# Patient Record
Sex: Male | Born: 1935 | Race: White | Hispanic: No | Marital: Married | Smoking: Former smoker
Health system: Southern US, Community
[De-identification: ages and names within clinical notes are randomized; demographics above are authoritative.]

## PROBLEM LIST (undated history)

## (undated) DIAGNOSIS — M419 Scoliosis, unspecified: Secondary | ICD-10-CM

## (undated) DIAGNOSIS — Z950 Presence of cardiac pacemaker: Secondary | ICD-10-CM

## (undated) DIAGNOSIS — M169 Osteoarthritis of hip, unspecified: Secondary | ICD-10-CM

## (undated) DIAGNOSIS — E7800 Pure hypercholesterolemia, unspecified: Secondary | ICD-10-CM

## (undated) DIAGNOSIS — E559 Vitamin D deficiency, unspecified: Secondary | ICD-10-CM

## (undated) DIAGNOSIS — K5900 Constipation, unspecified: Secondary | ICD-10-CM

## (undated) DIAGNOSIS — R350 Frequency of micturition: Secondary | ICD-10-CM

## (undated) DIAGNOSIS — R4189 Other symptoms and signs involving cognitive functions and awareness: Secondary | ICD-10-CM

## (undated) DIAGNOSIS — M479 Spondylosis, unspecified: Secondary | ICD-10-CM

## (undated) DIAGNOSIS — I499 Cardiac arrhythmia, unspecified: Secondary | ICD-10-CM

## (undated) DIAGNOSIS — N138 Other obstructive and reflux uropathy: Secondary | ICD-10-CM

## (undated) DIAGNOSIS — Z9889 Other specified postprocedural states: Secondary | ICD-10-CM

## (undated) DIAGNOSIS — Z8679 Personal history of other diseases of the circulatory system: Secondary | ICD-10-CM

## (undated) DIAGNOSIS — F419 Anxiety disorder, unspecified: Secondary | ICD-10-CM

## (undated) DIAGNOSIS — N183 Chronic kidney disease, stage 3 (moderate): Secondary | ICD-10-CM

## (undated) DIAGNOSIS — I10 Essential (primary) hypertension: Secondary | ICD-10-CM

## (undated) DIAGNOSIS — H3530 Unspecified macular degeneration: Secondary | ICD-10-CM

## (undated) DIAGNOSIS — N401 Enlarged prostate with lower urinary tract symptoms: Secondary | ICD-10-CM

## (undated) DIAGNOSIS — K219 Gastro-esophageal reflux disease without esophagitis: Secondary | ICD-10-CM

## (undated) DIAGNOSIS — M6289 Other specified disorders of muscle: Secondary | ICD-10-CM

## (undated) DIAGNOSIS — I491 Atrial premature depolarization: Secondary | ICD-10-CM

## (undated) DIAGNOSIS — R251 Tremor, unspecified: Secondary | ICD-10-CM

## (undated) DIAGNOSIS — M48061 Spinal stenosis, lumbar region without neurogenic claudication: Secondary | ICD-10-CM

## (undated) DIAGNOSIS — R2689 Other abnormalities of gait and mobility: Secondary | ICD-10-CM

## (undated) HISTORY — DX: Unspecified macular degeneration: H35.30

## (undated) HISTORY — DX: Personal history of other diseases of the circulatory system: Z86.79

## (undated) HISTORY — DX: Tremor, unspecified: R25.1

## (undated) HISTORY — DX: Osteoarthritis of hip, unspecified: M16.9

## (undated) HISTORY — DX: Frequency of micturition: R35.0

## (undated) HISTORY — DX: Other specified disorders of muscle: M62.89

## (undated) HISTORY — PX: COLONOSCOPY: SHX174

## (undated) HISTORY — DX: Pure hypercholesterolemia, unspecified: E78.00

## (undated) HISTORY — DX: Other symptoms and signs involving cognitive functions and awareness: R41.89

## (undated) HISTORY — DX: Benign prostatic hyperplasia with lower urinary tract symptoms: N40.1

## (undated) HISTORY — DX: Anxiety disorder, unspecified: F41.9

## (undated) HISTORY — PX: CHOLECYSTECTOMY: SHX55

## (undated) HISTORY — DX: Other specified postprocedural states: Z98.890

## (undated) HISTORY — DX: Essential (primary) hypertension: I10

## (undated) HISTORY — DX: Spondylosis, unspecified: M47.9

## (undated) HISTORY — DX: Vitamin D deficiency, unspecified: E55.9

## (undated) HISTORY — DX: Presence of cardiac pacemaker: Z95.0

## (undated) HISTORY — DX: Constipation, unspecified: K59.00

## (undated) HISTORY — DX: Scoliosis, unspecified: M41.9

## (undated) HISTORY — DX: Chronic kidney disease, stage 3 (moderate): N18.3

## (undated) HISTORY — DX: Other obstructive and reflux uropathy: N13.8

## (undated) HISTORY — DX: Spinal stenosis, lumbar region without neurogenic claudication: M48.061

## (undated) HISTORY — DX: Gastro-esophageal reflux disease without esophagitis: K21.9

## (undated) HISTORY — DX: Other abnormalities of gait and mobility: R26.89

## (undated) HISTORY — DX: Atrial premature depolarization: I49.1

---
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
## 1999-02-26 DIAGNOSIS — I499 Cardiac arrhythmia, unspecified: Secondary | ICD-10-CM

## 1999-02-26 HISTORY — DX: Cardiac arrhythmia, unspecified: I49.9

## 1999-02-26 HISTORY — PX: ATRIAL FLUTTER ABLATION: SHX5733

## 2006-10-08 DIAGNOSIS — E7800 Pure hypercholesterolemia, unspecified: Secondary | ICD-10-CM | POA: Insufficient documentation

## 2008-12-15 DIAGNOSIS — I491 Atrial premature depolarization: Secondary | ICD-10-CM

## 2008-12-15 HISTORY — DX: Atrial premature depolarization: I49.1

## 2009-02-09 DIAGNOSIS — I10 Essential (primary) hypertension: Secondary | ICD-10-CM

## 2009-02-09 HISTORY — DX: Essential (primary) hypertension: I10

## 2011-02-26 DIAGNOSIS — Z9889 Other specified postprocedural states: Secondary | ICD-10-CM

## 2011-02-26 HISTORY — DX: Other specified postprocedural states: Z98.890

## 2011-11-04 DIAGNOSIS — N183 Chronic kidney disease, stage 3 unspecified: Secondary | ICD-10-CM | POA: Insufficient documentation

## 2011-11-04 HISTORY — DX: Chronic kidney disease, stage 3 unspecified: N18.30

## 2012-05-18 DIAGNOSIS — E559 Vitamin D deficiency, unspecified: Secondary | ICD-10-CM

## 2012-05-18 HISTORY — DX: Vitamin D deficiency, unspecified: E55.9

## 2013-02-25 HISTORY — PX: CATARACT EXTRACTION: SUR2

## 2013-05-14 DIAGNOSIS — M419 Scoliosis, unspecified: Secondary | ICD-10-CM

## 2013-05-14 DIAGNOSIS — M169 Osteoarthritis of hip, unspecified: Secondary | ICD-10-CM

## 2013-05-14 DIAGNOSIS — M479 Spondylosis, unspecified: Secondary | ICD-10-CM

## 2013-05-14 HISTORY — DX: Scoliosis, unspecified: M41.9

## 2013-05-14 HISTORY — DX: Osteoarthritis of hip, unspecified: M16.9

## 2013-05-14 HISTORY — DX: Spondylosis, unspecified: M47.9

## 2013-07-07 DIAGNOSIS — M48062 Spinal stenosis, lumbar region with neurogenic claudication: Secondary | ICD-10-CM | POA: Insufficient documentation

## 2013-07-07 DIAGNOSIS — M48061 Spinal stenosis, lumbar region without neurogenic claudication: Secondary | ICD-10-CM

## 2013-07-07 HISTORY — DX: Spinal stenosis, lumbar region without neurogenic claudication: M48.061

## 2013-11-18 DIAGNOSIS — I495 Sick sinus syndrome: Secondary | ICD-10-CM | POA: Insufficient documentation

## 2013-12-07 DIAGNOSIS — N401 Enlarged prostate with lower urinary tract symptoms: Secondary | ICD-10-CM

## 2013-12-07 DIAGNOSIS — N138 Other obstructive and reflux uropathy: Secondary | ICD-10-CM

## 2013-12-07 DIAGNOSIS — R350 Frequency of micturition: Secondary | ICD-10-CM

## 2013-12-07 HISTORY — DX: Other obstructive and reflux uropathy: N40.1

## 2013-12-07 HISTORY — DX: Other obstructive and reflux uropathy: N13.8

## 2014-02-25 DIAGNOSIS — Z950 Presence of cardiac pacemaker: Secondary | ICD-10-CM

## 2014-02-25 HISTORY — PX: PACEMAKER PLACEMENT: SHX43

## 2014-02-25 HISTORY — PX: SPINE SURGERY: SHX786

## 2014-02-25 HISTORY — DX: Presence of cardiac pacemaker: Z95.0

## 2014-04-05 DIAGNOSIS — M4806 Spinal stenosis, lumbar region: Secondary | ICD-10-CM | POA: Diagnosis not present

## 2014-04-05 DIAGNOSIS — Z950 Presence of cardiac pacemaker: Secondary | ICD-10-CM | POA: Diagnosis not present

## 2014-04-05 DIAGNOSIS — M545 Low back pain: Secondary | ICD-10-CM | POA: Diagnosis not present

## 2014-04-05 DIAGNOSIS — I495 Sick sinus syndrome: Secondary | ICD-10-CM | POA: Diagnosis not present

## 2014-04-05 DIAGNOSIS — M961 Postlaminectomy syndrome, not elsewhere classified: Secondary | ICD-10-CM | POA: Diagnosis not present

## 2014-04-20 DIAGNOSIS — H3531 Nonexudative age-related macular degeneration: Secondary | ICD-10-CM | POA: Diagnosis not present

## 2014-05-30 DIAGNOSIS — H43813 Vitreous degeneration, bilateral: Secondary | ICD-10-CM | POA: Diagnosis not present

## 2014-05-30 DIAGNOSIS — H3531 Nonexudative age-related macular degeneration: Secondary | ICD-10-CM | POA: Diagnosis not present

## 2014-05-30 DIAGNOSIS — H35371 Puckering of macula, right eye: Secondary | ICD-10-CM | POA: Diagnosis not present

## 2014-06-07 DIAGNOSIS — M545 Low back pain: Secondary | ICD-10-CM | POA: Diagnosis not present

## 2014-06-07 DIAGNOSIS — M961 Postlaminectomy syndrome, not elsewhere classified: Secondary | ICD-10-CM | POA: Diagnosis not present

## 2014-06-07 DIAGNOSIS — M419 Scoliosis, unspecified: Secondary | ICD-10-CM | POA: Diagnosis not present

## 2014-06-15 DIAGNOSIS — Z862 Personal history of diseases of the blood and blood-forming organs and certain disorders involving the immune mechanism: Secondary | ICD-10-CM | POA: Diagnosis not present

## 2014-06-15 DIAGNOSIS — I10 Essential (primary) hypertension: Secondary | ICD-10-CM | POA: Diagnosis not present

## 2014-06-15 DIAGNOSIS — M4806 Spinal stenosis, lumbar region: Secondary | ICD-10-CM | POA: Diagnosis not present

## 2014-06-15 DIAGNOSIS — N183 Chronic kidney disease, stage 3 (moderate): Secondary | ICD-10-CM | POA: Diagnosis not present

## 2014-06-15 DIAGNOSIS — K219 Gastro-esophageal reflux disease without esophagitis: Secondary | ICD-10-CM | POA: Diagnosis not present

## 2014-06-15 DIAGNOSIS — E780 Pure hypercholesterolemia: Secondary | ICD-10-CM | POA: Diagnosis not present

## 2014-06-15 DIAGNOSIS — Z0000 Encounter for general adult medical examination without abnormal findings: Secondary | ICD-10-CM | POA: Diagnosis not present

## 2014-06-15 DIAGNOSIS — Z79899 Other long term (current) drug therapy: Secondary | ICD-10-CM | POA: Diagnosis not present

## 2014-06-15 DIAGNOSIS — F419 Anxiety disorder, unspecified: Secondary | ICD-10-CM | POA: Diagnosis not present

## 2014-06-15 DIAGNOSIS — I495 Sick sinus syndrome: Secondary | ICD-10-CM | POA: Diagnosis not present

## 2014-07-11 DIAGNOSIS — Z950 Presence of cardiac pacemaker: Secondary | ICD-10-CM | POA: Diagnosis not present

## 2014-07-11 DIAGNOSIS — I495 Sick sinus syndrome: Secondary | ICD-10-CM | POA: Diagnosis not present

## 2014-08-22 DIAGNOSIS — I491 Atrial premature depolarization: Secondary | ICD-10-CM | POA: Diagnosis not present

## 2014-08-22 DIAGNOSIS — I10 Essential (primary) hypertension: Secondary | ICD-10-CM | POA: Diagnosis not present

## 2014-08-22 DIAGNOSIS — I495 Sick sinus syndrome: Secondary | ICD-10-CM | POA: Diagnosis not present

## 2014-10-13 DIAGNOSIS — Z950 Presence of cardiac pacemaker: Secondary | ICD-10-CM | POA: Diagnosis not present

## 2014-10-13 DIAGNOSIS — I495 Sick sinus syndrome: Secondary | ICD-10-CM | POA: Diagnosis not present

## 2014-11-02 DIAGNOSIS — H3531 Nonexudative age-related macular degeneration: Secondary | ICD-10-CM | POA: Diagnosis not present

## 2014-11-28 DIAGNOSIS — H35371 Puckering of macula, right eye: Secondary | ICD-10-CM | POA: Diagnosis not present

## 2014-11-28 DIAGNOSIS — H353132 Nonexudative age-related macular degeneration, bilateral, intermediate dry stage: Secondary | ICD-10-CM | POA: Diagnosis not present

## 2014-11-28 DIAGNOSIS — H43813 Vitreous degeneration, bilateral: Secondary | ICD-10-CM | POA: Diagnosis not present

## 2014-11-29 DIAGNOSIS — H26493 Other secondary cataract, bilateral: Secondary | ICD-10-CM | POA: Diagnosis not present

## 2014-11-29 DIAGNOSIS — H353133 Nonexudative age-related macular degeneration, bilateral, advanced atrophic without subfoveal involvement: Secondary | ICD-10-CM | POA: Diagnosis not present

## 2014-11-29 DIAGNOSIS — Z961 Presence of intraocular lens: Secondary | ICD-10-CM | POA: Diagnosis not present

## 2014-11-29 DIAGNOSIS — H26492 Other secondary cataract, left eye: Secondary | ICD-10-CM | POA: Diagnosis not present

## 2014-11-29 DIAGNOSIS — H26491 Other secondary cataract, right eye: Secondary | ICD-10-CM | POA: Diagnosis not present

## 2014-12-01 DIAGNOSIS — N3000 Acute cystitis without hematuria: Secondary | ICD-10-CM | POA: Diagnosis not present

## 2014-12-01 DIAGNOSIS — R350 Frequency of micturition: Secondary | ICD-10-CM | POA: Diagnosis not present

## 2014-12-15 DIAGNOSIS — E7800 Pure hypercholesterolemia, unspecified: Secondary | ICD-10-CM | POA: Diagnosis not present

## 2014-12-15 DIAGNOSIS — I10 Essential (primary) hypertension: Secondary | ICD-10-CM | POA: Diagnosis not present

## 2014-12-15 DIAGNOSIS — Z79899 Other long term (current) drug therapy: Secondary | ICD-10-CM | POA: Diagnosis not present

## 2014-12-15 DIAGNOSIS — Z87448 Personal history of other diseases of urinary system: Secondary | ICD-10-CM | POA: Diagnosis not present

## 2014-12-15 DIAGNOSIS — N183 Chronic kidney disease, stage 3 (moderate): Secondary | ICD-10-CM | POA: Diagnosis not present

## 2014-12-15 DIAGNOSIS — M19041 Primary osteoarthritis, right hand: Secondary | ICD-10-CM | POA: Diagnosis not present

## 2015-01-16 DIAGNOSIS — J309 Allergic rhinitis, unspecified: Secondary | ICD-10-CM | POA: Diagnosis not present

## 2015-01-16 DIAGNOSIS — J019 Acute sinusitis, unspecified: Secondary | ICD-10-CM | POA: Diagnosis not present

## 2015-01-16 DIAGNOSIS — M65321 Trigger finger, right index finger: Secondary | ICD-10-CM | POA: Diagnosis not present

## 2015-02-08 DIAGNOSIS — Z950 Presence of cardiac pacemaker: Secondary | ICD-10-CM | POA: Diagnosis not present

## 2015-02-08 DIAGNOSIS — I491 Atrial premature depolarization: Secondary | ICD-10-CM | POA: Diagnosis not present

## 2015-02-08 DIAGNOSIS — I495 Sick sinus syndrome: Secondary | ICD-10-CM | POA: Diagnosis not present

## 2015-02-08 DIAGNOSIS — I10 Essential (primary) hypertension: Secondary | ICD-10-CM | POA: Diagnosis not present

## 2015-02-08 DIAGNOSIS — Z45018 Encounter for adjustment and management of other part of cardiac pacemaker: Secondary | ICD-10-CM | POA: Diagnosis not present

## 2015-02-15 DIAGNOSIS — M65341 Trigger finger, right ring finger: Secondary | ICD-10-CM | POA: Diagnosis not present

## 2015-03-30 DIAGNOSIS — M65341 Trigger finger, right ring finger: Secondary | ICD-10-CM | POA: Diagnosis not present

## 2015-05-08 DIAGNOSIS — H353132 Nonexudative age-related macular degeneration, bilateral, intermediate dry stage: Secondary | ICD-10-CM | POA: Diagnosis not present

## 2015-05-08 DIAGNOSIS — H43313 Vitreous membranes and strands, bilateral: Secondary | ICD-10-CM | POA: Diagnosis not present

## 2015-05-08 DIAGNOSIS — H04123 Dry eye syndrome of bilateral lacrimal glands: Secondary | ICD-10-CM | POA: Diagnosis not present

## 2015-06-01 DIAGNOSIS — H35371 Puckering of macula, right eye: Secondary | ICD-10-CM | POA: Diagnosis not present

## 2015-06-01 DIAGNOSIS — H353132 Nonexudative age-related macular degeneration, bilateral, intermediate dry stage: Secondary | ICD-10-CM | POA: Diagnosis not present

## 2015-06-21 DIAGNOSIS — Z950 Presence of cardiac pacemaker: Secondary | ICD-10-CM | POA: Diagnosis not present

## 2015-06-21 DIAGNOSIS — I495 Sick sinus syndrome: Secondary | ICD-10-CM | POA: Diagnosis not present

## 2015-06-28 DIAGNOSIS — Z79899 Other long term (current) drug therapy: Secondary | ICD-10-CM | POA: Diagnosis not present

## 2015-06-28 DIAGNOSIS — K219 Gastro-esophageal reflux disease without esophagitis: Secondary | ICD-10-CM | POA: Diagnosis not present

## 2015-06-28 DIAGNOSIS — Z862 Personal history of diseases of the blood and blood-forming organs and certain disorders involving the immune mechanism: Secondary | ICD-10-CM | POA: Diagnosis not present

## 2015-06-28 DIAGNOSIS — E7800 Pure hypercholesterolemia, unspecified: Secondary | ICD-10-CM | POA: Diagnosis not present

## 2015-06-28 DIAGNOSIS — M65342 Trigger finger, left ring finger: Secondary | ICD-10-CM | POA: Diagnosis not present

## 2015-06-28 DIAGNOSIS — J309 Allergic rhinitis, unspecified: Secondary | ICD-10-CM | POA: Diagnosis not present

## 2015-06-28 DIAGNOSIS — Z0000 Encounter for general adult medical examination without abnormal findings: Secondary | ICD-10-CM | POA: Diagnosis not present

## 2015-06-28 DIAGNOSIS — I4892 Unspecified atrial flutter: Secondary | ICD-10-CM | POA: Diagnosis not present

## 2015-07-12 DIAGNOSIS — M79676 Pain in unspecified toe(s): Secondary | ICD-10-CM | POA: Diagnosis not present

## 2015-07-12 DIAGNOSIS — L03031 Cellulitis of right toe: Secondary | ICD-10-CM | POA: Diagnosis not present

## 2015-07-18 DIAGNOSIS — L03031 Cellulitis of right toe: Secondary | ICD-10-CM | POA: Diagnosis not present

## 2015-07-18 DIAGNOSIS — R29898 Other symptoms and signs involving the musculoskeletal system: Secondary | ICD-10-CM | POA: Diagnosis not present

## 2015-08-03 ENCOUNTER — Encounter: Payer: Self-pay | Admitting: Internal Medicine

## 2015-08-03 ENCOUNTER — Non-Acute Institutional Stay: Payer: Medicare Other | Admitting: Internal Medicine

## 2015-08-03 VITALS — BP 132/62 | HR 66 | Temp 97.7°F | Ht 69.0 in | Wt 166.0 lb

## 2015-08-03 DIAGNOSIS — Z950 Presence of cardiac pacemaker: Secondary | ICD-10-CM

## 2015-08-03 DIAGNOSIS — Z8679 Personal history of other diseases of the circulatory system: Secondary | ICD-10-CM

## 2015-08-03 DIAGNOSIS — M48061 Spinal stenosis, lumbar region without neurogenic claudication: Secondary | ICD-10-CM

## 2015-08-03 DIAGNOSIS — M160 Bilateral primary osteoarthritis of hip: Secondary | ICD-10-CM | POA: Diagnosis not present

## 2015-08-03 DIAGNOSIS — R350 Frequency of micturition: Secondary | ICD-10-CM | POA: Diagnosis not present

## 2015-08-03 DIAGNOSIS — R4189 Other symptoms and signs involving cognitive functions and awareness: Secondary | ICD-10-CM

## 2015-08-03 DIAGNOSIS — N183 Chronic kidney disease, stage 3 unspecified: Secondary | ICD-10-CM

## 2015-08-03 DIAGNOSIS — H3530 Unspecified macular degeneration: Secondary | ICD-10-CM | POA: Diagnosis not present

## 2015-08-03 DIAGNOSIS — I10 Essential (primary) hypertension: Secondary | ICD-10-CM | POA: Diagnosis not present

## 2015-08-03 DIAGNOSIS — M791 Myalgia, unspecified site: Secondary | ICD-10-CM | POA: Insufficient documentation

## 2015-08-03 DIAGNOSIS — M6289 Other specified disorders of muscle: Secondary | ICD-10-CM

## 2015-08-03 DIAGNOSIS — N138 Other obstructive and reflux uropathy: Secondary | ICD-10-CM

## 2015-08-03 DIAGNOSIS — N401 Enlarged prostate with lower urinary tract symptoms: Secondary | ICD-10-CM | POA: Diagnosis not present

## 2015-08-03 DIAGNOSIS — K5900 Constipation, unspecified: Secondary | ICD-10-CM | POA: Insufficient documentation

## 2015-08-03 DIAGNOSIS — K219 Gastro-esophageal reflux disease without esophagitis: Secondary | ICD-10-CM

## 2015-08-03 DIAGNOSIS — F419 Anxiety disorder, unspecified: Secondary | ICD-10-CM

## 2015-08-03 DIAGNOSIS — R251 Tremor, unspecified: Secondary | ICD-10-CM | POA: Diagnosis not present

## 2015-08-03 DIAGNOSIS — M4806 Spinal stenosis, lumbar region: Secondary | ICD-10-CM

## 2015-08-03 DIAGNOSIS — R2689 Other abnormalities of gait and mobility: Secondary | ICD-10-CM

## 2015-08-03 DIAGNOSIS — R29818 Other symptoms and signs involving the nervous system: Secondary | ICD-10-CM | POA: Diagnosis not present

## 2015-08-03 HISTORY — DX: Anxiety disorder, unspecified: F41.9

## 2015-08-03 HISTORY — DX: Constipation, unspecified: K59.00

## 2015-08-03 HISTORY — DX: Other specified disorders of muscle: M62.89

## 2015-08-03 HISTORY — DX: Personal history of other diseases of the circulatory system: Z86.79

## 2015-08-03 HISTORY — DX: Gastro-esophageal reflux disease without esophagitis: K21.9

## 2015-08-03 NOTE — Progress Notes (Signed)
Patient ID: Stanley Hunter, male   DOB: [birth year], 82 y.o.   MRN: [id number]    HISTORY AND PHYSICAL  Location:  [place] of Service: Clinic (12)   Extended Emergency Contact Information Primary Emergency Contact: Pereida,[name] Address: [address]          [place], [place] [address] [place] Phone: [phone] Relation: Spouse  Advanced Directive information Does patient have an advance directive?: Yes, Type of Advance Directive: Healthcare Power of [place];Living will  Chief Complaint  Patient presents with  . Medical Management of Chronic Issues    New Patient -switching to [facility]. Previous doctor Dr. Burnett Sheng. Here with wife.    HPI:  Patient is changing physicians from his primary care in [facility] to [facility] for convenience because of the office at [facility]. Patient has become a resident at [facility] about one month ago.  History of pacemaker - placed in 2016 for sick sinus syndrome. Currently functioning very well .  Tremor - mild tremor of the extremities. Etiology has not been determined   Loss of balance - this has become a chronic issue. He uses a walker with 4 wheels. He denies any recent falls.   Macular degeneration - visual losses do not interfere with driving yet   Urinary frequency - possibly related to BPH   Cognitive changes - patient states his memory is not what it used to be, but doesn't seem to be creating any problems.   Myalgia - chronic issue with diffuse muscular aches. Much of this seems secondary to arthritic problems and related muscular spasm and overuse.   Lumbar canal stenosis - associated with chronic back discomfort   Chronic kidney disease (CKD), stage III (moderate) - patient will need an updated lab   Benign prostatic hyperplasia with urinary obstruction - likely the root cause of urinary frequency   Benign essential HTN -controlled    Primary  osteoarthritis of both hips    Past Medical History  Diagnosis Date  . High blood pressure   . High cholesterol   . History of pacemaker 2016  . Hx of cardiac cath 2013    Past Surgical History  Procedure Laterality Date  . Tonsillectomy  1934  . Cataract extraction Bilateral 2015  . Spine surgery  2016  . Mastoidectomy  1934    lower back   . Pacemaker placement  2016    Patient Care Team: Estill Dooms, MD as PCP - General (Internal Medicine)  Social History   Social History  . Marital Status: Married    Spouse Name: N/A  . Number of Children: N/A  . Years of Education: N/A   Occupational History  . Not on file.   Social History Main Topics  . Smoking status: Former Smoker -- 50 years    Types: Cigarettes    Quit date: 08/03/1975  . Smokeless tobacco: Never Used     Comment: smoked 6 cig dialy   . Alcohol Use: 1.8 oz/week    3 Standard drinks or equivalent per week     Comment: 3 times a week  . Drug Use: No  . Sexual Activity: Not on file   Other Topics Concern  . Not on file   Social History Narrative   Lives at [facility]  Since 4 /16/2016 with wife [name]   Diet: n/a   Caffeine: Yes   Married: yes, 1957   House:  Yes, 2 persons   Pets: no pets    Current/Past profession: Accountant    Exercise: No   Living Will: Yes   DNR: n/a   POA/HPOA: n/a       reports that he quit smoking about 40 years ago. His smoking use included Cigarettes. He quit after 50 years of use. He has never used smokeless tobacco. He reports that he drinks about 1.8 oz of alcohol per week. He reports that he does not use illicit drugs.  History reviewed. No pertinent family history. Family Status  Relation Status Death Age  . Father Deceased 57  . Mother Deceased 8  . Brother Deceased 54  . Son Alive   . Daughter Alive   . Daughter Alive     Immunization History  Administered Date(s) Administered  . Td 02/26/1996    Allergies  Allergen Reactions  .  Cortisone Other (See Comments)  . Neomycin Rash  . Sulfamethoxazole-Trimethoprim Other (See Comments)    Leg weakness    Medications: Patient's Medications  New Prescriptions   No medications on file  Previous Medications   ALPRAZOLAM (XANAX) 0.5 MG TABLET    Take 0.5 mg by mouth as needed for anxiety.   AMLODIPINE (NORVASC) 10 MG TABLET    Take 10 mg by mouth daily.   ASPIRIN EC 81 MG TABLET    Take 81 mg by mouth daily.   ATORVASTATIN (LIPITOR) 40 MG TABLET    Take 40 mg by mouth daily.   CETIRIZINE (ZYRTEC) 10 MG TABLET    Take 10 mg by mouth daily.   MAGNESIUM HYDROXIDE (MILK OF MAGNESIA) 800 MG/5ML SUSPENSION    Take 30 mLs by mouth as needed for constipation.   MULTIPLE VITAMINS-MINERALS (PRESERVISION AREDS) CAPS    2 by mouth daily   PANTOPRAZOLE (PROTONIX) 40 MG TABLET    Take 40 mg by mouth daily.   PROPAFENONE (RYTHMOL) 150 MG TABLET    1.5 tablets 4 times daily breakfast, lunch, dinner and at bedtime   TAMSULOSIN (FLOMAX) 0.4 MG CAPS CAPSULE    Take 0.4 mg by mouth at bedtime.   VITAMIN D, ERGOCALCIFEROL, PO      Modified Medications   No medications on file  Discontinued Medications   No medications on file    Review of Systems  Constitutional: Negative for fever, activity change, appetite change, fatigue and unexpected weight change.  HENT: Positive for sinus pressure. Negative for congestion, ear pain, hearing loss, rhinorrhea, sore throat, tinnitus, trouble swallowing and voice change.   Eyes: Positive for visual disturbance (macular degeneration).       Corrective lenses  Respiratory: Positive for chest tightness. Negative for cough, choking, shortness of breath and wheezing.   Cardiovascular: Positive for palpitations. Negative for chest pain and leg swelling.       Pacemakeer  Gastrointestinal: Negative for nausea, abdominal pain, diarrhea, constipation and abdominal distention.  Endocrine: Negative for cold intolerance, heat intolerance, polydipsia, polyphagia  and polyuria.  Genitourinary: Positive for frequency. Negative for dysuria, urgency and testicular pain.       Not incontinent  Musculoskeletal: Positive for myalgias, back pain and arthralgias. Negative for gait problem and neck pain.  Skin: Negative for color change, pallor and rash.  Allergic/Immunologic: Negative.   Neurological: Negative for dizziness, tremors, syncope, speech difficulty, weakness, numbness and headaches.       Memory loss  Hematological: Negative for adenopathy. Bruises/bleeds easily.  Psychiatric/Behavioral: Positive for confusion. Negative for hallucinations, behavioral problems, sleep disturbance  and decreased concentration. The patient is not nervous/anxious.     Filed Vitals:   08/03/15 1513  BP: 132/62  Pulse: 66  Temp: 97.7 F (36.5 C)  TempSrc: Oral  Height: 5\' 9"  (1.753 m)  Weight: 166 lb (75.297 kg)  SpO2: 94%   Body mass index is 24.5 kg/(m^2). Filed Weights   08/03/15 1513  Weight: 166 lb (75.297 kg)     Physical Exam  Constitutional: He is oriented to person, place, and time. He appears well-developed and well-nourished. No distress.  HENT:  Right Ear: External ear normal.  Left Ear: External ear normal.  Nose: Nose normal.  Mouth/Throat: Oropharynx is clear and moist. No oropharyngeal exudate.  Eyes: Conjunctivae and EOM are normal. Pupils are equal, round, and reactive to light.  Neck: No JVD present. No tracheal deviation present. No thyromegaly present.  Cardiovascular: Normal rate, regular rhythm, normal heart sounds and intact distal pulses.  Exam reveals no gallop and no friction rub.   No murmur heard. Pulmonary/Chest: No respiratory distress. He has no wheezes. He has no rales. He exhibits no tenderness.  Abdominal: He exhibits no distension and no mass. There is no tenderness.  Musculoskeletal: Normal range of motion. He exhibits no edema or tenderness.  Lymphadenopathy:    He has no cervical adenopathy.  Neurological: He is  alert and oriented to person, place, and time. He has normal reflexes. No cranial nerve deficit. Coordination normal.  Skin: No rash noted. No erythema. No pallor.  Psychiatric: He has a normal mood and affect. His behavior is normal. Judgment and thought content normal.    Labs reviewed: No flowsheet data found. No results found for: BUN No results found for: HGBA1C No results found for: TSH, T3TOTAL, T4TOTAL, THYROIDAB   Assessment/Plan  1. History of pacemaker Left upper chest. Nontender. Reportedly functioning well. Patient plans to continue to go to his cardiologist in [place].  2. Tremor Mild. Does not interfere with activities of daily living.  3. Loss of balance Unsteady on standing and walking. Strongly recommended that he continue use of walker.  4. Macular degeneration Continue ophthalmology consults  5. Urinary frequency Continue Flomax  6. Cognitive changes MMSE next visit  7. Myalgia -CK  8. Lumbar canal stenosis Chronic back discomfort related to this  9. Chronic kidney disease (CKD), stage III (moderate) -CMP  10. Benign prostatic hyperplasia with urinary obstruction -PSA  11. Benign essential HTN Continue current medications -CMP  12. Primary osteoarthritis of both hips Continue current medications  13. Hypercholesterolemia -Lipid panel

## 2015-08-07 ENCOUNTER — Encounter: Payer: Self-pay | Admitting: Internal Medicine

## 2015-08-07 DIAGNOSIS — Z950 Presence of cardiac pacemaker: Secondary | ICD-10-CM | POA: Insufficient documentation

## 2015-08-07 DIAGNOSIS — R2689 Other abnormalities of gait and mobility: Secondary | ICD-10-CM

## 2015-08-07 DIAGNOSIS — R251 Tremor, unspecified: Secondary | ICD-10-CM

## 2015-08-07 DIAGNOSIS — H3530 Unspecified macular degeneration: Secondary | ICD-10-CM

## 2015-08-07 DIAGNOSIS — R350 Frequency of micturition: Secondary | ICD-10-CM | POA: Insufficient documentation

## 2015-08-07 DIAGNOSIS — R4189 Other symptoms and signs involving cognitive functions and awareness: Secondary | ICD-10-CM

## 2015-08-07 HISTORY — DX: Other abnormalities of gait and mobility: R26.89

## 2015-08-07 HISTORY — DX: Frequency of micturition: R35.0

## 2015-08-07 HISTORY — DX: Tremor, unspecified: R25.1

## 2015-08-07 HISTORY — DX: Unspecified macular degeneration: H35.30

## 2015-08-07 HISTORY — DX: Other symptoms and signs involving cognitive functions and awareness: R41.89

## 2015-08-24 DIAGNOSIS — R9431 Abnormal electrocardiogram [ECG] [EKG]: Secondary | ICD-10-CM | POA: Diagnosis not present

## 2015-08-24 DIAGNOSIS — I493 Ventricular premature depolarization: Secondary | ICD-10-CM | POA: Diagnosis not present

## 2015-08-24 DIAGNOSIS — I440 Atrioventricular block, first degree: Secondary | ICD-10-CM | POA: Diagnosis not present

## 2015-08-24 DIAGNOSIS — I10 Essential (primary) hypertension: Secondary | ICD-10-CM | POA: Diagnosis not present

## 2015-08-24 DIAGNOSIS — I495 Sick sinus syndrome: Secondary | ICD-10-CM | POA: Diagnosis not present

## 2015-09-25 DIAGNOSIS — Z950 Presence of cardiac pacemaker: Secondary | ICD-10-CM | POA: Diagnosis not present

## 2015-09-25 DIAGNOSIS — I495 Sick sinus syndrome: Secondary | ICD-10-CM | POA: Diagnosis not present

## 2015-11-08 DIAGNOSIS — H04123 Dry eye syndrome of bilateral lacrimal glands: Secondary | ICD-10-CM | POA: Diagnosis not present

## 2015-11-08 DIAGNOSIS — H353132 Nonexudative age-related macular degeneration, bilateral, intermediate dry stage: Secondary | ICD-10-CM | POA: Diagnosis not present

## 2015-11-08 DIAGNOSIS — H35371 Puckering of macula, right eye: Secondary | ICD-10-CM | POA: Diagnosis not present

## 2015-11-16 ENCOUNTER — Encounter: Payer: Self-pay | Admitting: Nurse Practitioner

## 2015-11-16 ENCOUNTER — Non-Acute Institutional Stay: Payer: Medicare Other | Admitting: Nurse Practitioner

## 2015-11-16 DIAGNOSIS — Z8679 Personal history of other diseases of the circulatory system: Secondary | ICD-10-CM

## 2015-11-16 DIAGNOSIS — N183 Chronic kidney disease, stage 3 unspecified: Secondary | ICD-10-CM

## 2015-11-16 DIAGNOSIS — K219 Gastro-esophageal reflux disease without esophagitis: Secondary | ICD-10-CM

## 2015-11-16 DIAGNOSIS — E7800 Pure hypercholesterolemia, unspecified: Secondary | ICD-10-CM

## 2015-11-16 DIAGNOSIS — Z950 Presence of cardiac pacemaker: Secondary | ICD-10-CM

## 2015-11-16 DIAGNOSIS — M65342 Trigger finger, left ring finger: Secondary | ICD-10-CM | POA: Insufficient documentation

## 2015-11-16 DIAGNOSIS — M6530 Trigger finger, unspecified finger: Secondary | ICD-10-CM

## 2015-11-16 DIAGNOSIS — N138 Other obstructive and reflux uropathy: Secondary | ICD-10-CM

## 2015-11-16 DIAGNOSIS — I10 Essential (primary) hypertension: Secondary | ICD-10-CM

## 2015-11-16 DIAGNOSIS — N401 Enlarged prostate with lower urinary tract symptoms: Secondary | ICD-10-CM | POA: Diagnosis not present

## 2015-11-16 DIAGNOSIS — F419 Anxiety disorder, unspecified: Secondary | ICD-10-CM | POA: Diagnosis not present

## 2015-11-16 DIAGNOSIS — I491 Atrial premature depolarization: Secondary | ICD-10-CM

## 2015-11-16 NOTE — Assessment & Plan Note (Signed)
Continue Propafenone 150mg  daily

## 2015-11-16 NOTE — Assessment & Plan Note (Signed)
Will update CMP

## 2015-11-16 NOTE — Assessment & Plan Note (Signed)
Continue Atorvastatin 40mg  daily, update lipid panel.

## 2015-11-16 NOTE — Progress Notes (Signed)
Location:   [facility]   Place of Service:  Clinic (12)clinic [facility]  Provider: Marlana Latus NP  Code Status: DNR  Goals of Care: [place] 11/16/2015  Does patient have an advance directive? Yes  Type of Paramedic of [place];Living will  Does patient want to make changes to advanced directive? No - Patient declined  Copy of advanced directive(s) in chart? Yes     Chief Complaint  Patient presents with  . Acute Visit    left ring finger  x 1 month (trigger)    HPI: Patient is a 82 y.o. male seen today for an acute visit for trigger finger  The left 4th PIP, hx of the right 4th PIP, s/p surgical released.    Hx of HTN controlled on Amlodipine 10mg , Atorvastatin 40mg , ASA 81mg . BPH no urinary retention while on Tamsulosin 0.4mg , GERD stable, taking Pantoprazole 40mg , prn MOM 35ml for constipation, atrial premature complex,  heart rate is in control, on Profatenone 150mg  daily, prn Alprazolam for nerves.   Past Medical History:  Diagnosis Date  . Acid reflux 08/03/2015  . Anxiety 08/03/2015  . Avitaminosis D 05/18/2012  . Benign essential HTN 02/09/2009   Overview:  Benign Essential Hypertension   . Benign prostatic hyperplasia with urinary obstruction 12/07/2013  . Chronic kidney disease (CKD), stage III (moderate) 11/04/2011  . CN (constipation) 08/03/2015  . Cognitive changes 08/07/2015  . Degenerative arthritis of hip 05/14/2013  . Degenerative arthritis of spine 05/14/2013  . Degenerative disorder of muscle 08/03/2015  . H/O atrial flutter 08/03/2015   Overview:  Pacemaker placed 2015   . High blood pressure   . High cholesterol   . History of pacemaker 2016  . Hx of cardiac cath 2013  . Loss of balance 08/07/2015  . Lumbar canal stenosis 07/07/2013   Overview:  Lumbar laminectomy 07/07/13 - Dr. Lurene Shadow   . Lumbar scoliosis 05/14/2013  . Macular degeneration 08/07/2015  . Premature contractions, supraventricular 12/15/2008   Overview:  Atrial Premature  Complex   . Tremor 08/07/2015  . Urinary frequency 08/07/2015    Past Surgical History:  Procedure Laterality Date  . CATARACT EXTRACTION Bilateral 2015  . MASTOIDECTOMY  1934   lower back   . PACEMAKER PLACEMENT  2016  . SPINE SURGERY  2016  . TONSILLECTOMY  1934    Allergies  Allergen Reactions  . Cortisone Other (See Comments)  . Neomycin Rash  . Sulfamethoxazole-Trimethoprim Other (See Comments)    Leg weakness      Medication List       Accurate as of 11/16/15  2:11 PM. Always use your most recent med list.          ALPRAZolam 0.5 MG tablet Commonly known as:  XANAX Take 0.5 mg by mouth as needed for anxiety.   amLODipine 10 MG tablet Commonly known as:  NORVASC Take 10 mg by mouth daily.   aspirin EC 81 MG tablet Take 81 mg by mouth daily.   atorvastatin 40 MG tablet Commonly known as:  LIPITOR Take 40 mg by mouth daily.   cetirizine 10 MG tablet Commonly known as:  ZYRTEC Take 10 mg by mouth daily.   FEXOFENADINE HCL PO Take 1 tablet by mouth daily.   magnesium hydroxide 800 MG/5ML suspension Commonly known as:  MILK OF MAGNESIA Take 30 mLs by mouth as needed for constipation.   pantoprazole 40 MG tablet Commonly known as:  PROTONIX Take 40 mg by mouth daily.  PRESERVISION AREDS Caps 2 by mouth daily   propafenone 150 MG tablet Commonly known as:  RYTHMOL 1.5 tablets 4 times daily breakfast, lunch, dinner and at bedtime   tamsulosin 0.4 MG Caps capsule Commonly known as:  FLOMAX Take 0.4 mg by mouth at bedtime.   VITAMIN D (ERGOCALCIFEROL) PO       Review of Systems:  Review of Systems  Constitutional: Negative for activity change, appetite change, fatigue, fever and unexpected weight change.  HENT: Positive for sinus pressure. Negative for congestion, ear pain, hearing loss, rhinorrhea, sore throat, tinnitus, trouble swallowing and voice change.   Eyes: Positive for visual disturbance (macular degeneration).       Corrective  lenses  Respiratory: Positive for chest tightness. Negative for cough, choking, shortness of breath and wheezing.   Cardiovascular: Positive for palpitations and leg swelling. Negative for chest pain.       Pacemaker. Trace ankle edema.   Gastrointestinal: Negative for abdominal distention, abdominal pain, constipation, diarrhea and nausea.  Endocrine: Negative for cold intolerance, heat intolerance, polydipsia, polyphagia and polyuria.  Genitourinary: Positive for frequency. Negative for dysuria, testicular pain and urgency.       Not incontinent  Musculoskeletal: Positive for arthralgias, back pain and myalgias. Negative for gait problem and neck pain.  Skin: Negative for color change, pallor and rash.  Allergic/Immunologic: Negative.   Neurological: Negative for dizziness, tremors, syncope, speech difficulty, weakness, numbness and headaches.       Memory loss  Hematological: Negative for adenopathy. Bruises/bleeds easily.  Psychiatric/Behavioral: Positive for confusion. Negative for behavioral problems, decreased concentration, hallucinations and sleep disturbance. The patient is not nervous/anxious.     Health Maintenance  Topic Date Due  . ZOSTAVAX  07/31/1986  . PNA vac Low Risk Adult (1 of 2 - PCV13) 07/31/1991  . TETANUS/TDAP  02/25/2006  . INFLUENZA VACCINE  09/26/2015    Physical Exam: Vitals:   11/16/15 1337  BP: 110/72  Pulse: 82  Resp: 20  Temp: 98.3 F (36.8 C)  Weight: 168 lb (76.2 kg)  Height: 5\' 9"  (1.753 m)   Body mass index is 24.81 kg/m. Physical Exam  Constitutional: He is oriented to person, place, and time. He appears well-developed and well-nourished. No distress.  HENT:  Right Ear: External ear normal.  Left Ear: External ear normal.  Nose: Nose normal.  Mouth/Throat: Oropharynx is clear and moist. No oropharyngeal exudate.  Eyes: Conjunctivae and EOM are normal. Pupils are equal, round, and reactive to light.  Neck: No JVD present. No tracheal  deviation present. No thyromegaly present.  Cardiovascular: Normal rate, regular rhythm, normal heart sounds and intact distal pulses.  Exam reveals no gallop and no friction rub.   No murmur heard. Pulmonary/Chest: No respiratory distress. He has no wheezes. He has no rales. He exhibits no tenderness.  Abdominal: He exhibits no distension and no mass. There is no tenderness.  Musculoskeletal: Normal range of motion. He exhibits edema. He exhibits no tenderness.  Trace edema in ankles, chronic.   Lymphadenopathy:    He has no cervical adenopathy.  Neurological: He is alert and oriented to person, place, and time. He has normal reflexes. No cranial nerve deficit. Coordination normal.  Skin: No rash noted. No erythema. No pallor.  Psychiatric: He has a normal mood and affect. His behavior is normal. Judgment and thought content normal.    Labs reviewed: Basic Metabolic Panel: No results for input(s): NA, K, CL, CO2, GLUCOSE, BUN, CREATININE, CALCIUM, MG, PHOS, TSH in the  last 8760 hours. Liver Function Tests: No results for input(s): AST, ALT, ALKPHOS, BILITOT, PROT, ALBUMIN in the last 8760 hours. No results for input(s): LIPASE, AMYLASE in the last 8760 hours. No results for input(s): AMMONIA in the last 8760 hours. CBC: No results for input(s): WBC, NEUTROABS, HGB, HCT, MCV, PLT in the last 8760 hours. Lipid Panel: No results for input(s): CHOL, HDL, LDLCALC, TRIG, CHOLHDL, LDLDIRECT in the last 8760 hours. No results found for: HGBA1C  Procedures since last visit: No results found.  Assessment/Plan Benign essential HTN Controlled, continue Amlodipine 10mg , update CMP  Acid reflux Stable, continue Pantoprazole 40mg  daily. Update CBC  Benign prostatic hyperplasia with urinary obstruction Continue Tamsulosin 0.4mg , no urinary retention presently.   Chronic kidney disease (CKD), stage III (moderate) Will update CMP  Pure hypercholesterolemia Continue Atorvastatin 40mg  daily,  update lipid panel.   History of pacemaker F/u cardiology.   Anxiety Continue Alprazolam prn, update TSH, Hgb a1c  Premature contractions, supraventricular Continue Propafenone 150mg  daily  Trigger finger, acquired The left 4th PIP, OTC aspercreme, may referral to surgery.     Labs/tests ordered:  @ORDERS @ CBC, CMP, lipid panel, Hgb a1c, TSH. Referral to a hand surgeon.   Next appt:  02/01/2016

## 2015-11-16 NOTE — Assessment & Plan Note (Signed)
F/u cardiology 

## 2015-11-16 NOTE — Assessment & Plan Note (Signed)
The left 4th PIP, OTC aspercreme, may referral to surgery.

## 2015-11-16 NOTE — Assessment & Plan Note (Signed)
Stable, continue Pantoprazole 40mg  daily. Update CBC

## 2015-11-16 NOTE — Assessment & Plan Note (Signed)
Controlled, continue Amlodipine 10mg , update CMP

## 2015-11-16 NOTE — Assessment & Plan Note (Signed)
Continue Alprazolam prn, update TSH, Hgb a1c

## 2015-11-16 NOTE — Assessment & Plan Note (Signed)
Continue Tamsulosin 0.4mg , no urinary retention presently.

## 2015-11-24 DIAGNOSIS — R52 Pain, unspecified: Secondary | ICD-10-CM | POA: Insufficient documentation

## 2015-11-24 DIAGNOSIS — M65342 Trigger finger, left ring finger: Secondary | ICD-10-CM | POA: Diagnosis not present

## 2015-11-24 DIAGNOSIS — M79642 Pain in left hand: Secondary | ICD-10-CM | POA: Diagnosis not present

## 2015-11-27 ENCOUNTER — Other Ambulatory Visit: Payer: Self-pay | Admitting: Orthopedic Surgery

## 2015-11-27 DIAGNOSIS — H43813 Vitreous degeneration, bilateral: Secondary | ICD-10-CM | POA: Diagnosis not present

## 2015-11-27 DIAGNOSIS — H353132 Nonexudative age-related macular degeneration, bilateral, intermediate dry stage: Secondary | ICD-10-CM | POA: Diagnosis not present

## 2015-11-27 DIAGNOSIS — H35371 Puckering of macula, right eye: Secondary | ICD-10-CM | POA: Diagnosis not present

## 2015-12-11 ENCOUNTER — Encounter (HOSPITAL_BASED_OUTPATIENT_CLINIC_OR_DEPARTMENT_OTHER): Payer: Self-pay | Admitting: *Deleted

## 2015-12-11 DIAGNOSIS — F419 Anxiety disorder, unspecified: Secondary | ICD-10-CM | POA: Diagnosis not present

## 2015-12-11 DIAGNOSIS — Z950 Presence of cardiac pacemaker: Secondary | ICD-10-CM | POA: Diagnosis not present

## 2015-12-11 DIAGNOSIS — E7800 Pure hypercholesterolemia, unspecified: Secondary | ICD-10-CM | POA: Diagnosis not present

## 2015-12-11 DIAGNOSIS — I4891 Unspecified atrial fibrillation: Secondary | ICD-10-CM | POA: Diagnosis not present

## 2015-12-11 DIAGNOSIS — R350 Frequency of micturition: Secondary | ICD-10-CM | POA: Diagnosis not present

## 2015-12-11 DIAGNOSIS — Z7982 Long term (current) use of aspirin: Secondary | ICD-10-CM | POA: Diagnosis not present

## 2015-12-11 DIAGNOSIS — K5900 Constipation, unspecified: Secondary | ICD-10-CM | POA: Diagnosis not present

## 2015-12-11 DIAGNOSIS — Z79899 Other long term (current) drug therapy: Secondary | ICD-10-CM | POA: Diagnosis not present

## 2015-12-11 DIAGNOSIS — M65342 Trigger finger, left ring finger: Secondary | ICD-10-CM | POA: Diagnosis not present

## 2015-12-11 DIAGNOSIS — N138 Other obstructive and reflux uropathy: Secondary | ICD-10-CM | POA: Diagnosis not present

## 2015-12-11 DIAGNOSIS — N401 Enlarged prostate with lower urinary tract symptoms: Secondary | ICD-10-CM | POA: Diagnosis not present

## 2015-12-11 DIAGNOSIS — N183 Chronic kidney disease, stage 3 (moderate): Secondary | ICD-10-CM | POA: Diagnosis not present

## 2015-12-11 DIAGNOSIS — E559 Vitamin D deficiency, unspecified: Secondary | ICD-10-CM | POA: Diagnosis not present

## 2015-12-11 DIAGNOSIS — I129 Hypertensive chronic kidney disease with stage 1 through stage 4 chronic kidney disease, or unspecified chronic kidney disease: Secondary | ICD-10-CM | POA: Diagnosis not present

## 2015-12-11 DIAGNOSIS — Z87891 Personal history of nicotine dependence: Secondary | ICD-10-CM | POA: Diagnosis not present

## 2015-12-11 DIAGNOSIS — K219 Gastro-esophageal reflux disease without esophagitis: Secondary | ICD-10-CM | POA: Diagnosis not present

## 2015-12-14 ENCOUNTER — Ambulatory Visit (HOSPITAL_BASED_OUTPATIENT_CLINIC_OR_DEPARTMENT_OTHER): Payer: Medicare Other | Admitting: Certified Registered"

## 2015-12-14 ENCOUNTER — Encounter (HOSPITAL_BASED_OUTPATIENT_CLINIC_OR_DEPARTMENT_OTHER): Payer: Self-pay | Admitting: *Deleted

## 2015-12-14 ENCOUNTER — Encounter (HOSPITAL_BASED_OUTPATIENT_CLINIC_OR_DEPARTMENT_OTHER)
Admission: RE | Disposition: A | Discharge status: Home / Self Care | Payer: Self-pay | Source: Ambulatory Visit | Attending: Orthopedic Surgery

## 2015-12-14 ENCOUNTER — Ambulatory Visit (HOSPITAL_BASED_OUTPATIENT_CLINIC_OR_DEPARTMENT_OTHER)
Admission: RE | Admit: 2015-12-14 | Discharge: 2015-12-14 | Disposition: A | Discharge status: Home / Self Care | Payer: Medicare Other | Source: Ambulatory Visit | Attending: Orthopedic Surgery | Admitting: Orthopedic Surgery

## 2015-12-14 DIAGNOSIS — N401 Enlarged prostate with lower urinary tract symptoms: Secondary | ICD-10-CM | POA: Insufficient documentation

## 2015-12-14 DIAGNOSIS — N138 Other obstructive and reflux uropathy: Secondary | ICD-10-CM | POA: Insufficient documentation

## 2015-12-14 DIAGNOSIS — I4891 Unspecified atrial fibrillation: Secondary | ICD-10-CM | POA: Diagnosis not present

## 2015-12-14 DIAGNOSIS — I129 Hypertensive chronic kidney disease with stage 1 through stage 4 chronic kidney disease, or unspecified chronic kidney disease: Secondary | ICD-10-CM | POA: Diagnosis not present

## 2015-12-14 DIAGNOSIS — K219 Gastro-esophageal reflux disease without esophagitis: Secondary | ICD-10-CM | POA: Insufficient documentation

## 2015-12-14 DIAGNOSIS — F419 Anxiety disorder, unspecified: Secondary | ICD-10-CM | POA: Insufficient documentation

## 2015-12-14 DIAGNOSIS — N183 Chronic kidney disease, stage 3 (moderate): Secondary | ICD-10-CM | POA: Insufficient documentation

## 2015-12-14 DIAGNOSIS — Z7982 Long term (current) use of aspirin: Secondary | ICD-10-CM | POA: Insufficient documentation

## 2015-12-14 DIAGNOSIS — M65842 Other synovitis and tenosynovitis, left hand: Secondary | ICD-10-CM | POA: Diagnosis not present

## 2015-12-14 DIAGNOSIS — Z87891 Personal history of nicotine dependence: Secondary | ICD-10-CM | POA: Insufficient documentation

## 2015-12-14 DIAGNOSIS — M65342 Trigger finger, left ring finger: Secondary | ICD-10-CM | POA: Diagnosis not present

## 2015-12-14 DIAGNOSIS — K5900 Constipation, unspecified: Secondary | ICD-10-CM | POA: Diagnosis not present

## 2015-12-14 DIAGNOSIS — E7800 Pure hypercholesterolemia, unspecified: Secondary | ICD-10-CM | POA: Insufficient documentation

## 2015-12-14 DIAGNOSIS — E559 Vitamin D deficiency, unspecified: Secondary | ICD-10-CM | POA: Diagnosis not present

## 2015-12-14 DIAGNOSIS — R350 Frequency of micturition: Secondary | ICD-10-CM | POA: Diagnosis not present

## 2015-12-14 DIAGNOSIS — Z79899 Other long term (current) drug therapy: Secondary | ICD-10-CM | POA: Insufficient documentation

## 2015-12-14 DIAGNOSIS — Z950 Presence of cardiac pacemaker: Secondary | ICD-10-CM | POA: Insufficient documentation

## 2015-12-14 DIAGNOSIS — M1990 Unspecified osteoarthritis, unspecified site: Secondary | ICD-10-CM | POA: Diagnosis not present

## 2015-12-14 HISTORY — PX: TRIGGER FINGER RELEASE: SHX641

## 2015-12-14 HISTORY — DX: Presence of cardiac pacemaker: Z95.0

## 2015-12-14 HISTORY — DX: Cardiac arrhythmia, unspecified: I49.9

## 2015-12-14 SURGERY — RELEASE, A1 PULLEY, FOR TRIGGER FINGER
Anesthesia: Monitor Anesthesia Care | Site: Hand | Laterality: Left

## 2015-12-14 MED ORDER — LIDOCAINE 2% (20 MG/ML) 5 ML SYRINGE
INTRAMUSCULAR | Status: AC
  Filled 2015-12-14: qty 5

## 2015-12-14 MED ORDER — CEFAZOLIN SODIUM-DEXTROSE 2-4 GM/100ML-% IV SOLN
2.0000 g | INTRAVENOUS | Status: AC
  Administered 2015-12-14: 2 g via INTRAVENOUS

## 2015-12-14 MED ORDER — LIDOCAINE HCL (PF) 0.5 % IJ SOLN
INTRAMUSCULAR | Status: DC | PRN
  Administered 2015-12-14: 35 mL via INTRAVENOUS

## 2015-12-14 MED ORDER — GLYCOPYRROLATE 0.2 MG/ML IJ SOLN: 0.2000 mg | Freq: Once | INTRAMUSCULAR | Status: DC | PRN

## 2015-12-14 MED ORDER — DEXAMETHASONE SODIUM PHOSPHATE 10 MG/ML IJ SOLN
INTRAMUSCULAR | Status: AC
  Filled 2015-12-14: qty 1

## 2015-12-14 MED ORDER — ONDANSETRON HCL 4 MG/2ML IJ SOLN
INTRAMUSCULAR | Status: AC
  Filled 2015-12-14: qty 2

## 2015-12-14 MED ORDER — LACTATED RINGERS IV SOLN
INTRAVENOUS | Status: DC
  Administered 2015-12-14: 12:00:00 via INTRAVENOUS

## 2015-12-14 MED ORDER — PROPOFOL 10 MG/ML IV BOLUS
INTRAVENOUS | Status: DC | PRN
  Administered 2015-12-14 (×2): 10 mg via INTRAVENOUS

## 2015-12-14 MED ORDER — MIDAZOLAM HCL 2 MG/2ML IJ SOLN: 1.0000 mg | INTRAMUSCULAR | Status: DC | PRN

## 2015-12-14 MED ORDER — FENTANYL CITRATE (PF) 100 MCG/2ML IJ SOLN
INTRAMUSCULAR | Status: AC
  Filled 2015-12-14: qty 2

## 2015-12-14 MED ORDER — BUPIVACAINE HCL (PF) 0.25 % IJ SOLN
INTRAMUSCULAR | Status: DC | PRN
  Administered 2015-12-14: 5 mL

## 2015-12-14 MED ORDER — CHLORHEXIDINE GLUCONATE 4 % EX LIQD: 60.0000 mL | Freq: Once | CUTANEOUS | Status: DC

## 2015-12-14 MED ORDER — SCOPOLAMINE 1 MG/3DAYS TD PT72: 1.0000 | MEDICATED_PATCH | Freq: Once | TRANSDERMAL | Status: DC | PRN

## 2015-12-14 MED ORDER — FENTANYL CITRATE (PF) 100 MCG/2ML IJ SOLN
50.0000 ug | INTRAMUSCULAR | Status: DC | PRN
  Administered 2015-12-14 (×2): 25 ug via INTRAVENOUS

## 2015-12-14 MED ORDER — HYDROCODONE-ACETAMINOPHEN 5-325 MG PO TABS: 1.0000 | ORAL_TABLET | Freq: Four times a day (QID) | ORAL | 0 refills | Status: DC | PRN

## 2015-12-14 SURGICAL SUPPLY — 33 items
BANDAGE COBAN STERILE 2 (GAUZE/BANDAGES/DRESSINGS) ×3 IMPLANT
BLADE SURG 15 STRL LF DISP TIS (BLADE) ×1 IMPLANT
BLADE SURG 15 STRL SS (BLADE) ×2
BNDG ESMARK 4X9 LF (GAUZE/BANDAGES/DRESSINGS) IMPLANT
CHLORAPREP W/TINT 26ML (MISCELLANEOUS) ×3 IMPLANT
CORDS BIPOLAR (ELECTRODE) IMPLANT
COVER BACK TABLE 60X90IN (DRAPES) ×3 IMPLANT
COVER MAYO STAND STRL (DRAPES) ×3 IMPLANT
CUFF TOURNIQUET SINGLE 18IN (TOURNIQUET CUFF) ×3 IMPLANT
DECANTER SPIKE VIAL GLASS SM (MISCELLANEOUS) IMPLANT
DRAPE EXTREMITY T 121X128X90 (DRAPE) ×3 IMPLANT
DRAPE SURG 17X23 STRL (DRAPES) ×3 IMPLANT
GAUZE SPONGE 4X4 12PLY STRL (GAUZE/BANDAGES/DRESSINGS) ×3 IMPLANT
GAUZE XEROFORM 1X8 LF (GAUZE/BANDAGES/DRESSINGS) ×3 IMPLANT
GLOVE BIO SURGEON STRL SZ 6.5 (GLOVE) ×2 IMPLANT
GLOVE BIO SURGEONS STRL SZ 6.5 (GLOVE) ×1
GLOVE BIOGEL PI IND STRL 7.0 (GLOVE) ×2 IMPLANT
GLOVE BIOGEL PI IND STRL 8.5 (GLOVE) ×1 IMPLANT
GLOVE BIOGEL PI INDICATOR 7.0 (GLOVE) ×4
GLOVE BIOGEL PI INDICATOR 8.5 (GLOVE) ×2
GLOVE SURG ORTHO 8.0 STRL STRW (GLOVE) ×3 IMPLANT
GOWN STRL REUS W/ TWL LRG LVL3 (GOWN DISPOSABLE) ×1 IMPLANT
GOWN STRL REUS W/TWL LRG LVL3 (GOWN DISPOSABLE) ×2
GOWN STRL REUS W/TWL XL LVL3 (GOWN DISPOSABLE) ×3 IMPLANT
NEEDLE PRECISIONGLIDE 27X1.5 (NEEDLE) ×3 IMPLANT
NS IRRIG 1000ML POUR BTL (IV SOLUTION) ×3 IMPLANT
PACK BASIN DAY SURGERY FS (CUSTOM PROCEDURE TRAY) ×3 IMPLANT
STOCKINETTE 4X48 STRL (DRAPES) ×3 IMPLANT
SUT ETHILON 4 0 PS 2 18 (SUTURE) ×3 IMPLANT
SYR BULB 3OZ (MISCELLANEOUS) ×3 IMPLANT
SYR CONTROL 10ML LL (SYRINGE) ×3 IMPLANT
TOWEL OR 17X24 6PK STRL BLUE (TOWEL DISPOSABLE) ×3 IMPLANT
UNDERPAD 30X30 (UNDERPADS AND DIAPERS) ×3 IMPLANT

## 2015-12-14 NOTE — H&P (Signed)
Stanley Hunter is an 82 y.o. male.   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 Nyoka Cowden for consultation regarding catching of his left ring finger. States his been going on approximately 2 weeks. He recalls no history of injury. He states that the pain is sharp with a VAS score 3/10. He occasionally has to use up his opposite hand to straighten the finger out. He states it gets worse as the day goes on. He has tried Naprosyn and Motrin without relief. He has had a trigger finger released surgically on his opposite side after injections did not help. He does not like taking cortisone. He has no history of diabetes thyroid problems he does have a history of arthritis there is no history of gout. Family history is negative for diabetes thyroid problems arthritis and gout. He has a history of atrial fib and takes aspirin for this.  Past Medical History:  Diagnosis Date  . A-fib ([place])  . Pacemaker   Past Surgical History:  Procedure Laterality Date  . CARDIAC PACEMAKER PLACEMENT  . GALLBLADDER SURGERY  . SPINE SURGERY       Past Medical History:  Diagnosis Date  . Acid reflux 08/03/2015  . Anxiety 08/03/2015  . Avitaminosis D 05/18/2012  . Benign essential HTN 02/09/2009   Overview:  Benign Essential Hypertension   . Benign prostatic hyperplasia with urinary obstruction 12/07/2013  . Chronic kidney disease (CKD), stage III (moderate) 11/04/2011  . CN (constipation) 08/03/2015  . Cognitive changes 08/07/2015  . Degenerative arthritis of hip 05/14/2013  . Degenerative arthritis of spine 05/14/2013  . Degenerative disorder of muscle 08/03/2015  . Dysrhythmia 2001   ablation for a-flutter  . H/O atrial flutter 08/03/2015   Overview:  Pacemaker placed 2015   . High blood pressure   . High cholesterol   . History of pacemaker 2016  . Hx of cardiac cath 2013  . Loss of balance 08/07/2015  . Lumbar canal  stenosis 07/07/2013   Overview:  Lumbar laminectomy 07/07/13 - Dr. Lurene Shadow   . Lumbar scoliosis 05/14/2013  . Macular degeneration 08/07/2015  . Premature contractions, supraventricular 12/15/2008   Overview:  Atrial Premature Complex   . Presence of permanent cardiac pacemaker   . Tremor 08/07/2015  . Urinary frequency 08/07/2015    Past Surgical History:  Procedure Laterality Date  . ATRIAL FLUTTER ABLATION  2001  . CATARACT EXTRACTION Bilateral 2015  . CHOLECYSTECTOMY    . MASTOIDECTOMY  1934   lower back   . PACEMAKER PLACEMENT  2016  . SPINE SURGERY  2016  . TONSILLECTOMY  1934    History reviewed. No pertinent family history. Social History:  reports that he quit smoking about 40 years ago. His smoking use included Cigarettes. He quit after 50.00 years of use. He has never used smokeless tobacco. He reports that he drinks about 1.8 oz of alcohol per week . He reports that he does not use drugs.  Allergies:  Allergies  Allergen Reactions  . Cortisone Other (See Comments)  . Neomycin Rash  . Sulfamethoxazole-Trimethoprim Other (See Comments)    Leg weakness    No prescriptions prior to admission.    No results found for this or any previous visit (from the past 48 hour(s)).  No results found.   Pertinent items are noted in HPI.  Height 5\' 9"  (1.753 m), weight 74.8 kg (165 lb).  General appearance:  alert, cooperative and appears stated age Head: Normocephalic, without obvious abnormality Neck: no JVD Resp: clear to auscultation bilaterally Cardio: regular rate and rhythm, S1, S2 normal, no murmur, click, rub or gallop GI: soft, non-tender; bowel sounds normal; no masses,  no organomegaly Extremities: catching left ring finger Pulses: 2+ and symmetric Skin: Skin color, texture, turgor normal. No rashes or lesions Neurologic: Grossly normal Incision/Wound: na  Assessment/Plan Assessment:    Trigger ring finger of left hand    Plan: We have discussed with  him possibility of injections to this. He states he does not want to have injections would prefer to have this surgically released. Prepare and postoperative course were discussed along with risk complications. He is aware that there is no guarantee to the surgery the possibility of infection recurrence injury to arteries nerves tendons incomplete relief of symptoms and dystrophy. Scheduled for release of A1 pulley left ring finger as an outpatient under regional anesthesia. Questions are encouraged and answered to his satisfaction.      Madlyn Crosby R 12/14/2015, 10:38 AM

## 2015-12-14 NOTE — Discharge Instructions (Addendum)

## 2015-12-14 NOTE — Anesthesia Preprocedure Evaluation (Addendum)
Anesthesia Evaluation  Patient identified by MRN, date of birth, ID band Patient awake    Reviewed: Allergy & Precautions, NPO status , Patient's Chart, lab work & pertinent test results  History of Anesthesia Complications Negative for: history of anesthetic complications  Airway Mallampati: II  TM Distance: >3 FB Neck ROM: Full    Dental  (+) Teeth Intact, Dental Advisory Given   Pulmonary neg pulmonary ROS, former smoker,    breath sounds clear to auscultation       Cardiovascular hypertension, + dysrhythmias + pacemaker  Rhythm:Regular Rate:Normal     Neuro/Psych  Neuromuscular disease    GI/Hepatic GERD  ,  Endo/Other  negative endocrine ROS  Renal/GU Renal Insufficiency and CRFRenal disease     Musculoskeletal  (+) Arthritis ,   Abdominal   Peds  Hematology negative hematology ROS (+)   Anesthesia Other Findings   Reproductive/Obstetrics                            Anesthesia Physical Anesthesia Plan  ASA: III  Anesthesia Plan: MAC and Regional   Post-op Pain Management:    Induction: Intravenous  Airway Management Planned: Simple Face Mask  Additional Equipment:   Intra-op Plan:   Post-operative Plan:   Informed Consent: I have reviewed the patients History and Physical, chart, labs and discussed the procedure including the risks, benefits and alternatives for the proposed anesthesia with the patient or authorized representative who has indicated his/her understanding and acceptance.     Plan Discussed with:   Anesthesia Plan Comments:         Anesthesia Quick Evaluation

## 2015-12-14 NOTE — Brief Op Note (Signed)
12/14/2015  12:55 PM  PATIENT:  Inetta Fermo  82 y.o. male  PRE-OPERATIVE DIAGNOSIS:  Stenosing tenosynovitis left ring  POST-OPERATIVE DIAGNOSIS:  Stenosing tenosynovitis left ring  PROCEDURE:  Procedure(s) with comments: RELEASE TRIGGER FINGER/A-1 PULLEY ring finger left (Left) - FAB  SURGEON:  Surgeon(s) and Role:    * Daryll Brod, MD - Primary  PHYSICIAN ASSISTANT:   ASSISTANTS: none   ANESTHESIA:   local and regional  EBL:  Total I/O In: 600 [I.V.:600] Out: 5 [Blood:5]  BLOOD ADMINISTERED:none  DRAINS: none   LOCAL MEDICATIONS USED:  BUPIVICAINE   SPECIMEN:  Excision  DISPOSITION OF SPECIMEN:  N/A  COUNTS:  YES  TOURNIQUET:   Total Tourniquet Time Documented: Forearm (Left) - 18 minutes Total: Forearm (Left) - 18 minutes   DICTATION: .Other Dictation: Dictation Number [id number]  PLAN OF CARE: Discharge to home after PACU  PATIENT DISPOSITION:  PACU - hemodynamically stable.

## 2015-12-14 NOTE — Transfer of Care (Signed)
Immediate Anesthesia Transfer of Care Note  Patient: Stanley Hunter  Procedure(s) Performed: Procedure(s) with comments: RELEASE TRIGGER FINGER/A-1 PULLEY ring finger left (Left) - FAB  Patient Location: PACU  Anesthesia Type:MAC and Bier block  Level of Consciousness: awake and alert   Airway & Oxygen Therapy: Patient Spontanous Breathing  Post-op Assessment: Report given to RN and Post -op Vital signs reviewed and stable  Post vital signs: Reviewed and stable  Last Vitals:  Vitals:   12/14/15 1149  BP: (!) 160/68  Pulse: 84  Resp: 20  Temp: 36.5 C    Last Pain:  Vitals:   12/14/15 1149  TempSrc: Oral  PainSc: 3          Complications: No apparent anesthesia complications

## 2015-12-14 NOTE — Op Note (Signed)
Dictation Number [id number]

## 2015-12-14 NOTE — Anesthesia Postprocedure Evaluation (Signed)
Anesthesia Post Note  Patient: Stanley Hunter  Procedure(s) Performed: Procedure(s) (LRB): RELEASE TRIGGER FINGER/A-1 PULLEY ring finger left (Left)  Patient location during evaluation: PACU Anesthesia Type: MAC Level of consciousness: awake and alert Pain management: pain level controlled Vital Signs Assessment: post-procedure vital signs reviewed and stable Respiratory status: spontaneous breathing, nonlabored ventilation, respiratory function stable and patient connected to nasal cannula oxygen Cardiovascular status: stable and blood pressure returned to baseline Anesthetic complications: no    Last Vitals:  Vitals:   12/14/15 1320 12/14/15 1342  BP:  137/60  Pulse: (!) 58 63  Resp: (!) 22 20  Temp:  36.4 C    Last Pain:  Vitals:   12/14/15 1342  TempSrc:   PainSc: 0-No pain                 Brei Pociask,JAMES TERRILL

## 2015-12-15 ENCOUNTER — Encounter (HOSPITAL_BASED_OUTPATIENT_CLINIC_OR_DEPARTMENT_OTHER): Payer: Self-pay | Admitting: Orthopedic Surgery

## 2015-12-15 NOTE — Op Note (Signed)
NAME:  Stanley Hunter, Stanley Hunter NO.:  [id number]  MEDICAL RECORD NO.:  [id number]  LOCATION:                                 FACILITY:  PHYSICIAN:  Daryll Brod, M.D.            DATE OF BIRTH:  DATE OF PROCEDURE:  12/14/2015 DATE OF DISCHARGE:                              OPERATIVE REPORT   PREOPERATIVE DIAGNOSIS:  Stenosing tenosynovitis trigger, left ring finger.  POSTOPERATIVE DIAGNOSIS:  Stenosing tenosynovitis trigger, left ring finger.  OPERATION:  Release of A1 pulley, left ring finger.  ANESTHESIA:  Forearm-based IV regional with local infiltration.  PLACE OF SURGERY:  [facility].  ANESTHESIOLOGIST:  Ala Dach, M.D.  HISTORY:  The patient is an 82 year old male with a history of triggering of the left ring finger.  He has deferred any conservative treatment, has elected undergo surgical release of the A1 pulley.  Pre, peri, and postoperative course have been discussed along with risks and complications.  He is aware that there is no guarantee to the surgery, the possibility of infection; recurrence of injury to arteries, nerves, tendons; incomplete relief of symptoms and dystrophy.  In preoperative area, the patient is seen, the extremity marked by both patient and surgeon.  Antibiotic given.  PROCEDURE IN DETAIL:  The patient was brought to the operating room where a forearm-based IV regional anesthetic was carried out without difficulty under the direction of Dr. Orene Desanctis.  It was prepped using ChloraPrep in supine position with the left arm free.  A 3-minute dry time was allowed.  Time-out taken, confirming the patient and procedure. An oblique incision was made over the A1 pulley of left ring finger. This was carried down through subcutaneous tissue.  Bleeders were electrocauterized with bipolar.  Retractors were placed protecting neurovascular bundles radially and ulnarly.  The A1 pulley was identified.  This was released on its  radial aspect.  A small incision was made centrally in the A2 pulley.  The tenosynovial tissue was then separated proximally with blunt dissection.  The tendons were then separated preventing any adhesion formation to remain.  Wound was copiously irrigated with saline.  Finger was passed through a full passive range of motion.  I could actively flex.  No further triggering was noted.  The wound was then closed with interrupted 4-0 nylon sutures.  A local infiltration with 0.25% bupivacaine without epinephrine was given.  Approximately, 5-6 mL was used.  Sterile compressive dressing with the fingers free was applied.  On deflation of the tourniquet, all fingers immediately pinked.  He was taken to the recovery room for observation in satisfactory condition.  He will be discharged to home to return to [facility] in 1 week on Norco.          ______________________________ Daryll Brod, M.D.     GK/MEDQ  D:  12/14/2015  T:  12/15/2015  Job:  [id number]

## 2015-12-15 NOTE — Addendum Note (Signed)
Addendum  created 12/15/15 [id number] by Ernesta Amble Ronon Ferger, CRNA   Charge Capture section accepted

## 2016-01-01 DIAGNOSIS — Z950 Presence of cardiac pacemaker: Secondary | ICD-10-CM | POA: Diagnosis not present

## 2016-01-01 DIAGNOSIS — I495 Sick sinus syndrome: Secondary | ICD-10-CM | POA: Diagnosis not present

## 2016-01-10 ENCOUNTER — Other Ambulatory Visit: Payer: Self-pay

## 2016-01-10 DIAGNOSIS — E7800 Pure hypercholesterolemia, unspecified: Secondary | ICD-10-CM

## 2016-01-10 DIAGNOSIS — I10 Essential (primary) hypertension: Secondary | ICD-10-CM

## 2016-01-10 DIAGNOSIS — N138 Other obstructive and reflux uropathy: Secondary | ICD-10-CM

## 2016-01-10 DIAGNOSIS — N401 Enlarged prostate with lower urinary tract symptoms: Secondary | ICD-10-CM

## 2016-01-22 ENCOUNTER — Other Ambulatory Visit: Payer: Self-pay

## 2016-01-22 DIAGNOSIS — I10 Essential (primary) hypertension: Secondary | ICD-10-CM

## 2016-01-22 DIAGNOSIS — N138 Other obstructive and reflux uropathy: Secondary | ICD-10-CM | POA: Diagnosis not present

## 2016-01-22 DIAGNOSIS — N401 Enlarged prostate with lower urinary tract symptoms: Secondary | ICD-10-CM

## 2016-01-22 DIAGNOSIS — E7800 Pure hypercholesterolemia, unspecified: Secondary | ICD-10-CM

## 2016-01-23 DIAGNOSIS — N401 Enlarged prostate with lower urinary tract symptoms: Secondary | ICD-10-CM | POA: Diagnosis not present

## 2016-01-23 DIAGNOSIS — I10 Essential (primary) hypertension: Secondary | ICD-10-CM | POA: Diagnosis not present

## 2016-01-23 DIAGNOSIS — E7800 Pure hypercholesterolemia, unspecified: Secondary | ICD-10-CM | POA: Diagnosis not present

## 2016-01-23 LAB — COMPREHENSIVE METABOLIC PANEL
ALK PHOS: 69 U/L (ref 40–115)
ALT: 17 U/L (ref 9–46)
AST: 21 U/L (ref 10–35)
Albumin: 3.9 g/dL (ref 3.6–5.1)
BILIRUBIN TOTAL: 0.7 mg/dL (ref 0.2–1.2)
BUN: 29 mg/dL — AB (ref 7–25)
CO2: 27 mmol/L (ref 20–31)
CREATININE: 1.49 mg/dL — AB (ref 0.70–1.11)
Calcium: 8.7 mg/dL (ref 8.6–10.3)
Chloride: 106 mmol/L (ref 98–110)
GLUCOSE: 92 mg/dL (ref 65–99)
Potassium: 4.3 mmol/L (ref 3.5–5.3)
SODIUM: 141 mmol/L (ref 135–146)
Total Protein: 6.9 g/dL (ref 6.1–8.1)

## 2016-01-23 LAB — LIPID PANEL
CHOLESTEROL: 151 mg/dL (ref <200)
CHOLESTEROL: 151 mg/dL (ref 0–200)
HDL: 78 mg/dL (ref >40)
HDL: 78 mg/dL — AB (ref 35–70)
LDL CALC: 64 mg/dL
LDL Cholesterol: 64 mg/dL (ref <100)
TRIGLYCERIDES: 44 mg/dL (ref <150)
TRIGLYCERIDES: 44 mg/dL (ref 40–160)
Total CHOL/HDL Ratio: 1.9 Ratio (ref <5.0)
VLDL: 9 mg/dL (ref <30)

## 2016-01-23 LAB — HEPATIC FUNCTION PANEL
ALK PHOS: 69 U/L (ref 25–125)
ALT: 17 U/L (ref 10–40)
AST: 21 U/L (ref 14–40)
Bilirubin, Total: 0.7 mg/dL

## 2016-01-23 LAB — BASIC METABOLIC PANEL
BUN: 29 mg/dL — AB (ref 4–21)
Creatinine: 1.5 mg/dL — AB (ref 0.6–1.3)
Glucose: 92 mg/dL
Potassium: 4.3 mmol/L (ref 3.4–5.3)
Sodium: 141 mmol/L (ref 137–147)

## 2016-01-23 LAB — PSA
PSA: 2
PSA: 2 ng/mL (ref <=4.0)

## 2016-01-24 LAB — MICROALBUMIN, URINE: Microalb, Ur: 1.5

## 2016-02-01 ENCOUNTER — Encounter: Payer: Self-pay | Admitting: Internal Medicine

## 2016-02-01 ENCOUNTER — Non-Acute Institutional Stay: Payer: Medicare Other | Admitting: Internal Medicine

## 2016-02-01 VITALS — BP 120/70 | HR 67 | Temp 97.5°F | Ht 69.0 in | Wt 167.0 lb

## 2016-02-01 DIAGNOSIS — R609 Edema, unspecified: Secondary | ICD-10-CM | POA: Diagnosis not present

## 2016-02-01 DIAGNOSIS — R2689 Other abnormalities of gait and mobility: Secondary | ICD-10-CM

## 2016-02-01 DIAGNOSIS — R4189 Other symptoms and signs involving cognitive functions and awareness: Secondary | ICD-10-CM

## 2016-02-01 DIAGNOSIS — M48061 Spinal stenosis, lumbar region without neurogenic claudication: Secondary | ICD-10-CM | POA: Diagnosis not present

## 2016-02-01 DIAGNOSIS — M6530 Trigger finger, unspecified finger: Secondary | ICD-10-CM

## 2016-02-01 DIAGNOSIS — R251 Tremor, unspecified: Secondary | ICD-10-CM | POA: Diagnosis not present

## 2016-02-01 DIAGNOSIS — E7800 Pure hypercholesterolemia, unspecified: Secondary | ICD-10-CM

## 2016-02-01 NOTE — Progress Notes (Signed)
Facility  [facility]    Place of Service: Clinic (12)     Allergies  Allergen Reactions  . Cortisone Other (See Comments)  . Neomycin Rash  . Sulfamethoxazole-Trimethoprim Other (See Comments)    Leg weakness    Chief Complaint  Patient presents with  . Medical Management of Chronic Issues    6 month medication management cholesterol, cognitive changes, unstead gait, tremor, review labs. Here with wife.    HPI:   Stanley Hunter to [facility] for infx of the right great toe a couple of months ago. Was given sulfa antibiotic, but felt he was having side effect of loss of coordination. Better since stopping it.  Pure hypercholesterolemia - controlled  Cognitive changes -- unchanged  Loss of balance - continues to use walker  Trigger finger, acquired - On 12/14/15, Dr. Fredna Dow did release of A1 pulley, left ring finger.  Tremor - unchanged  Spinal stenosis of lumbar region without neurogenic claudication - back pains are constant when he is active. Beter when sitting or lying down. He does not feel like he needs referral. Hx of LS stenosis and surgery that did not seem to help.  Edema is athe same and is localized to the lower legs.  Medications: Patient's Medications  New Prescriptions   No medications on file  Previous Medications   ALPRAZOLAM (XANAX) 0.5 MG TABLET    Take 0.5 mg by mouth as needed for anxiety.   AMLODIPINE (NORVASC) 10 MG TABLET    Take 10 mg by mouth daily.   ASPIRIN EC 81 MG TABLET    Take 81 mg by mouth daily.   ATORVASTATIN (LIPITOR) 40 MG TABLET    Take 40 mg by mouth daily.   CETIRIZINE (ZYRTEC) 10 MG TABLET    Take by mouth. Take one tablet daily for allergies   FEXOFENADINE HCL PO    Take 1 tablet by mouth daily.   MAGNESIUM HYDROXIDE (MILK OF MAGNESIA) 800 MG/5ML SUSPENSION    Take 30 mLs by mouth as needed for constipation.   MULTIPLE VITAMINS-MINERALS (PRESERVISION AREDS) CAPS    2 by mouth daily   PANTOPRAZOLE (PROTONIX) 40 MG TABLET     Take 40 mg by mouth daily.   PROPAFENONE (RYTHMOL) 150 MG TABLET    1.5 tablets 4 times daily breakfast, lunch, dinner and at bedtime   TAMSULOSIN (FLOMAX) 0.4 MG CAPS CAPSULE    Take 0.4 mg by mouth at bedtime.   VITAMIN D, ERGOCALCIFEROL, PO      Modified Medications   No medications on file  Discontinued Medications   HYDROCODONE-ACETAMINOPHEN (NORCO) 5-325 MG TABLET    Take 1 tablet by mouth every 6 (six) hours as needed for moderate pain.     Review of Systems  Constitutional: Negative for activity change, appetite change, fatigue, fever and unexpected weight change.  HENT: Positive for sinus pressure. Negative for congestion, ear pain, hearing loss, rhinorrhea, sore throat, tinnitus, trouble swallowing and voice change.   Eyes: Positive for visual disturbance (macular degeneration).       Corrective lenses  Respiratory: Positive for chest tightness. Negative for cough, choking, shortness of breath and wheezing.   Cardiovascular: Positive for palpitations and leg swelling (both lower legs). Negative for chest pain.       Pacemakeer  Gastrointestinal: Negative for abdominal distention, abdominal pain, constipation, diarrhea and nausea.  Endocrine: Negative for cold intolerance, heat intolerance, polydipsia, polyphagia and polyuria.  Genitourinary: Positive for frequency. Negative for dysuria, testicular  pain and urgency.       Not incontinent  Musculoskeletal: Positive for arthralgias, back pain and myalgias. Negative for gait problem and neck pain.  Skin: Negative for color change, pallor and rash.  Allergic/Immunologic: Negative.   Neurological: Negative for dizziness, tremors, syncope, speech difficulty, weakness, numbness and headaches.       Memory loss  Hematological: Negative for adenopathy. Bruises/bleeds easily.  Psychiatric/Behavioral: Positive for confusion. Negative for behavioral problems, decreased concentration, hallucinations and sleep disturbance. The patient is not  nervous/anxious.     Vitals:   02/01/16 1337  BP: 120/70  Pulse: 67  Temp: 97.5 F (36.4 C)  TempSrc: Oral  SpO2: 95%  Weight: 167 lb (75.8 kg)  Height: 5' 9" (1.753 m)   Wt Readings from Last 3 Encounters:  02/01/16 167 lb (75.8 kg)  12/14/15 167 lb (75.8 kg)  11/16/15 168 lb (76.2 kg)    Body mass index is 24.66 kg/m.  Physical Exam  Constitutional: He is oriented to person, place, and time. He appears well-developed and well-nourished. No distress.  HENT:  Right Ear: External ear normal.  Left Ear: External ear normal.  Nose: Nose normal.  Mouth/Throat: Oropharynx is clear and moist. No oropharyngeal exudate.  Eyes: Conjunctivae and EOM are normal. Pupils are equal, round, and reactive to light.  Neck: No JVD present. No tracheal deviation present. No thyromegaly present.  Cardiovascular: Normal rate, regular rhythm, normal heart sounds and intact distal pulses.  Exam reveals no gallop and no friction rub.   No murmur heard. Pulmonary/Chest: No respiratory distress. He has no wheezes. He has no rales. He exhibits no tenderness.  Abdominal: He exhibits no distension and no mass. There is no tenderness.  Musculoskeletal: Normal range of motion. He exhibits edema (1+ both lower legs). He exhibits no tenderness.  Lymphadenopathy:    He has no cervical adenopathy.  Neurological: He is alert and oriented to person, place, and time. He has normal reflexes. No cranial nerve deficit. Coordination normal.  Skin: No rash noted. No erythema. No pallor.  Scar left palm at [facility] from release of contracture. Ovoid patch of eczematous rash on the right medial lower leg.   Psychiatric: He has a normal mood and affect. His behavior is normal. Judgment and thought content normal.     Labs reviewed: Lab Summary Latest Ref Rng & Units 01/23/2016 01/22/2016  Hemoglobin - (None) (None)  Hematocrit - (None) (None)  White count - (None) (None)  Platelet count - (None) (None)  Sodium  137 - 147 mmol/L 141 141  Potassium 3.4 - 5.3 mmol/L 4.3 4.3  Calcium 8.6 - 10.3 mg/dL (None) 8.7  Phosphorus - (None) (None)  Creatinine 0.6 - 1.3 mg/dL 1.5(A) 1.49(H)  AST 14 - 40 U/L 21 21  Alk Phos 25 - 125 U/L 69 69  Bilirubin 0.2 - 1.2 mg/dL (None) 0.7  Glucose mg/dL 92 92  Cholesterol 0 - 200 mg/dL 151 151  HDL cholesterol 35 - 70 mg/dL 78(A) 78  Triglycerides 40 - 160 mg/dL 44 44  LDL Direct - (None) (None)  LDL Calc mg/dL 64 64  Total protein 6.1 - 8.1 g/dL (None) 6.9  Albumin 3.6 - 5.1 g/dL (None) 3.9   No results found for: TSH Lab Results  Component Value Date   BUN 29 (A) 01/23/2016   BUN 29 (H) 01/22/2016   Lab Results  Component Value Date   CREATININE 1.5 (A) 01/23/2016   CREATININE 1.49 (H) 01/22/2016   No  results found for: HGBA1C     Assessment/Plan  1. Pure hypercholesterolemia controlled  2. Cognitive changes -MMSE next visit  3. Loss of balance stable  4. Trigger finger, acquired Doing well post surgery  5. Tremor unchanged  6. Spinal stenosis of lumbar region without neurogenic claudication unchanged  7. Edema, unspecified type unchanged - Comprehensive metabolic panel; Future

## 2016-02-15 ENCOUNTER — Encounter: Payer: Medicare Other | Admitting: Internal Medicine

## 2016-02-21 DIAGNOSIS — I491 Atrial premature depolarization: Secondary | ICD-10-CM | POA: Diagnosis not present

## 2016-02-21 DIAGNOSIS — I495 Sick sinus syndrome: Secondary | ICD-10-CM | POA: Diagnosis not present

## 2016-02-21 DIAGNOSIS — Z950 Presence of cardiac pacemaker: Secondary | ICD-10-CM | POA: Diagnosis not present

## 2016-02-21 DIAGNOSIS — Z45018 Encounter for adjustment and management of other part of cardiac pacemaker: Secondary | ICD-10-CM | POA: Diagnosis not present

## 2016-02-22 ENCOUNTER — Other Ambulatory Visit: Payer: Self-pay

## 2016-02-22 MED ORDER — AMLODIPINE BESYLATE 10 MG PO TABS: 10.0000 mg | ORAL_TABLET | Freq: Every day | ORAL | 1 refills | Status: DC

## 2016-04-02 ENCOUNTER — Encounter: Payer: Self-pay | Admitting: Internal Medicine

## 2016-04-08 DIAGNOSIS — I10 Essential (primary) hypertension: Secondary | ICD-10-CM | POA: Diagnosis not present

## 2016-04-08 DIAGNOSIS — K219 Gastro-esophageal reflux disease without esophagitis: Secondary | ICD-10-CM | POA: Diagnosis not present

## 2016-04-08 DIAGNOSIS — I739 Peripheral vascular disease, unspecified: Secondary | ICD-10-CM | POA: Diagnosis not present

## 2016-04-08 DIAGNOSIS — I4891 Unspecified atrial fibrillation: Secondary | ICD-10-CM | POA: Diagnosis not present

## 2016-04-08 DIAGNOSIS — R41841 Cognitive communication deficit: Secondary | ICD-10-CM | POA: Diagnosis not present

## 2016-04-08 DIAGNOSIS — F419 Anxiety disorder, unspecified: Secondary | ICD-10-CM | POA: Diagnosis not present

## 2016-04-08 DIAGNOSIS — R1313 Dysphagia, pharyngeal phase: Secondary | ICD-10-CM | POA: Diagnosis not present

## 2016-04-15 DIAGNOSIS — I10 Essential (primary) hypertension: Secondary | ICD-10-CM | POA: Diagnosis not present

## 2016-04-15 DIAGNOSIS — R1313 Dysphagia, pharyngeal phase: Secondary | ICD-10-CM | POA: Diagnosis not present

## 2016-04-15 DIAGNOSIS — K219 Gastro-esophageal reflux disease without esophagitis: Secondary | ICD-10-CM | POA: Diagnosis not present

## 2016-04-15 DIAGNOSIS — I739 Peripheral vascular disease, unspecified: Secondary | ICD-10-CM | POA: Diagnosis not present

## 2016-04-15 DIAGNOSIS — F419 Anxiety disorder, unspecified: Secondary | ICD-10-CM | POA: Diagnosis not present

## 2016-04-15 DIAGNOSIS — R41841 Cognitive communication deficit: Secondary | ICD-10-CM | POA: Diagnosis not present

## 2016-04-16 DIAGNOSIS — L602 Onychogryphosis: Secondary | ICD-10-CM | POA: Diagnosis not present

## 2016-04-16 DIAGNOSIS — M2041 Other hammer toe(s) (acquired), right foot: Secondary | ICD-10-CM | POA: Diagnosis not present

## 2016-04-18 ENCOUNTER — Encounter: Payer: Self-pay | Admitting: Internal Medicine

## 2016-04-18 ENCOUNTER — Ambulatory Visit (INDEPENDENT_AMBULATORY_CARE_PROVIDER_SITE_OTHER): Payer: Medicare Other | Admitting: Internal Medicine

## 2016-04-18 VITALS — BP 138/70 | HR 65 | Temp 97.9°F | Wt 167.0 lb

## 2016-04-18 DIAGNOSIS — R0981 Nasal congestion: Secondary | ICD-10-CM | POA: Diagnosis not present

## 2016-04-18 MED ORDER — FLUTICASONE PROPIONATE 50 MCG/ACT NA SUSP: 2.0000 | Freq: Every day | NASAL | 6 refills | Status: DC

## 2016-04-18 MED ORDER — GUAIFENESIN ER 600 MG PO TB12: 600.0000 mg | ORAL_TABLET | Freq: Two times a day (BID) | ORAL | 0 refills | Status: DC

## 2016-04-18 NOTE — Patient Instructions (Addendum)
Be sure to drink plenty of water to thin your mucus. The flonase will help you at bedtime to clear your nose and decrease drainage, The mucinex should also thin the mucus so you can cough it up and out.

## 2016-04-18 NOTE — Progress Notes (Signed)
Location:  [facility] clinic Provider: Stoy Fenn L. Mariea Clonts, D.O., C.M.D. PCP:  Dr. Nyoka Cowden  Code Status: full code Goals of Care:  Advanced Directives 04/18/2016  Does Patient Have a Medical Advance Directive? Yes  Type of Paramedic of [place];Living will  Does patient want to make changes to medical advance directive? -  Copy of [place] in Chart? Yes   Chief Complaint  Patient presents with  . Acute Visit    upper respiratory infection for months    HPI: Patient is a 83 y.o. male seen today for an acute visit for an upper respiratory infection.  Having some congestion for a month or two coming down from his head into his throat and into his chest.  Sometimes a lot of vibration and noise in the upper chest.  He is hoarse.  Otherwise feels well.  Has difficulty getting the mucus up and out.  A bit thick, slightly salty.  No headaches or head pressure.  Never a bother before these few months.  He's tried zyrtec and allegra which did not do anything.  No fevers or chills or sore throat.  Seldom acid indigestion.    Past Medical History:  Diagnosis Date  . Acid reflux 08/03/2015  . Anxiety 08/03/2015  . Avitaminosis D 05/18/2012  . Benign essential HTN 02/09/2009   Overview:  Benign Essential Hypertension   . Benign prostatic hyperplasia with urinary obstruction 12/07/2013  . Chronic kidney disease (CKD), stage III (moderate) 11/04/2011  . CN (constipation) 08/03/2015  . Cognitive changes 08/07/2015  . Degenerative arthritis of hip 05/14/2013  . Degenerative arthritis of spine 05/14/2013  . Degenerative disorder of muscle 08/03/2015  . Dysrhythmia 2001   ablation for a-flutter  . H/O atrial flutter 08/03/2015   Overview:  Pacemaker placed 2015   . High blood pressure   . High cholesterol   . History of pacemaker 2016  . Hx of cardiac cath 2013  . Loss of balance 08/07/2015  . Lumbar canal stenosis 07/07/2013   Overview:  Lumbar laminectomy 07/07/13 - Dr.  Lurene Shadow   . Lumbar scoliosis 05/14/2013  . Macular degeneration 08/07/2015  . Premature contractions, supraventricular 12/15/2008   Overview:  Atrial Premature Complex   . Presence of permanent cardiac pacemaker   . Tremor 08/07/2015  . Urinary frequency 08/07/2015    Past Surgical History:  Procedure Laterality Date  . ATRIAL FLUTTER ABLATION  2001  . CATARACT EXTRACTION Bilateral 2015  . CHOLECYSTECTOMY    . MASTOIDECTOMY  1934   lower back   . PACEMAKER PLACEMENT  2016  . SPINE SURGERY  2016  . TONSILLECTOMY  1934  . TRIGGER FINGER RELEASE Left 12/14/2015   Procedure: RELEASE TRIGGER FINGER/A-1 PULLEY ring finger left;  Surgeon: Daryll Brod, MD;  Location: [facility];  Service: Orthopedics;  Laterality: Left;  FAB    Allergies  Allergen Reactions  . Cortisone Other (See Comments)  . Neomycin Rash  . Sulfamethoxazole-Trimethoprim Other (See Comments)    Leg weakness    Allergies as of 04/18/2016      Reactions   Cortisone Other (See Comments)   Neomycin Rash   Sulfamethoxazole-trimethoprim Other (See Comments)   Leg weakness      Medication List       Accurate as of 04/18/16  4:38 PM. Always use your most recent med list.          ALPRAZolam 0.5 MG tablet Commonly known as:  XANAX Take 0.5  mg by mouth as needed for anxiety.   amLODipine 10 MG tablet Commonly known as:  NORVASC Take 1 tablet (10 mg total) by mouth daily.   aspirin EC 81 MG tablet Take 81 mg by mouth daily.   atorvastatin 40 MG tablet Commonly known as:  LIPITOR Take 40 mg by mouth daily.   fluticasone 50 MCG/ACT nasal spray Commonly known as:  FLONASE Place 2 sprays into both nostrils daily.   guaiFENesin 600 MG 12 hr tablet Commonly known as:  MUCINEX Take 1 tablet (600 mg total) by mouth 2 (two) times daily.   magnesium hydroxide 800 MG/5ML suspension Commonly known as:  MILK OF MAGNESIA Take 30 mLs by mouth as needed for constipation.   pantoprazole 40 MG  tablet Commonly known as:  PROTONIX Take 40 mg by mouth daily.   PRESERVISION AREDS Caps 2 by mouth daily   propafenone 150 MG tablet Commonly known as:  RYTHMOL 1.5 tablets 4 times daily breakfast, lunch, dinner and at bedtime   tamsulosin 0.4 MG Caps capsule Commonly known as:  FLOMAX Take 0.4 mg by mouth at bedtime.   VITAMIN D (ERGOCALCIFEROL) PO       Review of Systems:  Review of Systems  Constitutional: Negative for chills and fever.  HENT: Positive for congestion. Negative for sinus pain and sore throat.        Hoarseness  Respiratory: Positive for cough and sputum production. Negative for stridor.   Cardiovascular: Negative for chest pain.  Gastrointestinal: Positive for heartburn.  Genitourinary: Negative for dysuria.  Musculoskeletal:       Unsteady gait--uses walker sometimes  Neurological: Negative for dizziness.    Health Maintenance  Topic Date Due  . PNA vac Low Risk Adult (1 of 2 - PCV13) 07/31/1991  . TETANUS/TDAP  02/25/2006  . INFLUENZA VACCINE  09/26/2015    Physical Exam: Vitals:   04/18/16 1550  BP: 138/70  Pulse: 65  Temp: 97.9 F (36.6 C)  TempSrc: Oral  SpO2: 95%  Weight: 167 lb (75.8 kg)   Body mass index is 24.66 kg/m. Physical Exam  Constitutional: He appears well-developed and well-nourished. No distress.  HENT:  Head: Normocephalic and atraumatic.  Right Ear: External ear normal.  Left Ear: External ear normal.  Nose: Nose normal.  Thick white mucus draining; bilateral cerumen impaction   Eyes: Conjunctivae are normal.  glasses  Neck: Normal range of motion.  Cardiovascular: Normal rate, regular rhythm, normal heart sounds and intact distal pulses.   Pulmonary/Chest: Effort normal and breath sounds normal. No respiratory distress. He has no wheezes. He has no rales.  No rhonchi  Abdominal: Soft. Bowel sounds are normal.  Musculoskeletal: Normal range of motion.  Unsteady gait  Neurological: He is alert.    Labs  reviewed: Basic Metabolic Panel:  Recent Labs  01/22/16 0001 01/23/16  NA 141 141  K 4.3 4.3  CL 106  --   CO2 27  --   GLUCOSE 92  --   BUN 29* 29*  CREATININE 1.49* 1.5*  CALCIUM 8.7  --    Liver Function Tests:  Recent Labs  01/22/16 0001 01/23/16  AST 21 21  ALT 17 17  ALKPHOS 69 69  BILITOT 0.7  --   PROT 6.9  --   ALBUMIN 3.9  --    No results for input(s): LIPASE, AMYLASE in the last 8760 hours. No results for input(s): AMMONIA in the last 8760 hours. CBC: No results for input(s): WBC, NEUTROABS, HGB,  HCT, MCV, PLT in the last 8760 hours. Lipid Panel:  Recent Labs  01/22/16 0001 01/23/16  CHOL 151 151  HDL 78 78*  LDLCALC 64 64  TRIG 44 44  CHOLHDL 1.9  --    Assessment/Plan 1. Sinus congestion - no help with allegra or zyrtec -pt does not think he can use nettipot (unsteady on feet so doubt he can balance at sink for this) - fluticasone (FLONASE) 50 MCG/ACT nasal spray; Place 2 sprays into both nostrils daily.  Dispense: 16 g; Refill: 6 - guaiFENesin (MUCINEX) 600 MG 12 hr tablet; Take 1 tablet (600 mg total) by mouth 2 (two) times daily.  Dispense: 60 tablet; Refill: 0 -call back if not getting better  Labs/tests ordered:  No orders of the defined types were placed in this encounter.  Next appt:  06/06/2016  Tifany Hirsch L. Hermila Millis, D.O. [facility] [address], [place] [address] Cell Phone (Mon-Fri 8am-5pm):  [phone] On Call:  [phone] & follow prompts after 5pm & weekends Office Phone:  [phone] Office Fax:  [phone]

## 2016-04-21 ENCOUNTER — Emergency Department (HOSPITAL_COMMUNITY): Payer: Medicare Other

## 2016-04-21 ENCOUNTER — Encounter (HOSPITAL_COMMUNITY): Payer: Self-pay | Admitting: Emergency Medicine

## 2016-04-21 ENCOUNTER — Observation Stay (HOSPITAL_COMMUNITY)
Admission: EM | Admit: 2016-04-21 | Discharge: 2016-04-22 | Disposition: A | Discharge status: Home / Self Care | Payer: Medicare Other | Attending: Family Medicine | Admitting: Family Medicine

## 2016-04-21 DIAGNOSIS — Z7982 Long term (current) use of aspirin: Secondary | ICD-10-CM | POA: Diagnosis not present

## 2016-04-21 DIAGNOSIS — Z950 Presence of cardiac pacemaker: Secondary | ICD-10-CM | POA: Diagnosis not present

## 2016-04-21 DIAGNOSIS — J101 Influenza due to other identified influenza virus with other respiratory manifestations: Secondary | ICD-10-CM | POA: Diagnosis not present

## 2016-04-21 DIAGNOSIS — F419 Anxiety disorder, unspecified: Secondary | ICD-10-CM | POA: Diagnosis present

## 2016-04-21 DIAGNOSIS — N183 Chronic kidney disease, stage 3 unspecified: Secondary | ICD-10-CM | POA: Diagnosis present

## 2016-04-21 DIAGNOSIS — R05 Cough: Secondary | ICD-10-CM | POA: Diagnosis not present

## 2016-04-21 DIAGNOSIS — J9601 Acute respiratory failure with hypoxia: Secondary | ICD-10-CM | POA: Diagnosis not present

## 2016-04-21 DIAGNOSIS — K219 Gastro-esophageal reflux disease without esophagitis: Secondary | ICD-10-CM | POA: Diagnosis present

## 2016-04-21 DIAGNOSIS — J181 Lobar pneumonia, unspecified organism: Secondary | ICD-10-CM | POA: Diagnosis not present

## 2016-04-21 DIAGNOSIS — J189 Pneumonia, unspecified organism: Secondary | ICD-10-CM

## 2016-04-21 DIAGNOSIS — I10 Essential (primary) hypertension: Secondary | ICD-10-CM | POA: Diagnosis not present

## 2016-04-21 DIAGNOSIS — I129 Hypertensive chronic kidney disease with stage 1 through stage 4 chronic kidney disease, or unspecified chronic kidney disease: Secondary | ICD-10-CM | POA: Insufficient documentation

## 2016-04-21 DIAGNOSIS — Z87891 Personal history of nicotine dependence: Secondary | ICD-10-CM | POA: Insufficient documentation

## 2016-04-21 DIAGNOSIS — I495 Sick sinus syndrome: Secondary | ICD-10-CM | POA: Diagnosis present

## 2016-04-21 DIAGNOSIS — Z79899 Other long term (current) drug therapy: Secondary | ICD-10-CM | POA: Diagnosis not present

## 2016-04-21 DIAGNOSIS — R509 Fever, unspecified: Secondary | ICD-10-CM | POA: Diagnosis present

## 2016-04-21 LAB — CBC WITH DIFFERENTIAL/PLATELET
BASOS ABS: 0 10*3/uL (ref 0.0–0.1)
Basophils Relative: 0 %
EOS ABS: 0 10*3/uL (ref 0.0–0.7)
Eosinophils Relative: 0 %
HEMATOCRIT: 34.7 % — AB (ref 39.0–52.0)
Hemoglobin: 11.9 g/dL — ABNORMAL LOW (ref 13.0–17.0)
LYMPHS ABS: 1.4 10*3/uL (ref 0.7–4.0)
Lymphocytes Relative: 21 %
MCH: 31.3 pg (ref 26.0–34.0)
MCHC: 34.3 g/dL (ref 30.0–36.0)
MCV: 91.3 fL (ref 78.0–100.0)
MONO ABS: 0.7 10*3/uL (ref 0.1–1.0)
Monocytes Relative: 11 %
NEUTROS ABS: 4.5 10*3/uL (ref 1.7–7.7)
Neutrophils Relative %: 68 %
Platelets: 140 10*3/uL — ABNORMAL LOW (ref 150–400)
RBC: 3.8 MIL/uL — ABNORMAL LOW (ref 4.22–5.81)
RDW: 15.3 % (ref 11.5–15.5)
WBC: 6.6 10*3/uL (ref 4.0–10.5)

## 2016-04-21 LAB — BASIC METABOLIC PANEL
Anion gap: 9 (ref 5–15)
BUN: 23 mg/dL — AB (ref 6–20)
CALCIUM: 8.5 mg/dL — AB (ref 8.9–10.3)
CO2: 22 mmol/L (ref 22–32)
CREATININE: 1.5 mg/dL — AB (ref 0.61–1.24)
Chloride: 106 mmol/L (ref 101–111)
GFR calc Af Amer: 46 mL/min — ABNORMAL LOW (ref >60)
GFR, EST NON AFRICAN AMERICAN: 40 mL/min — AB (ref >60)
GLUCOSE: 123 mg/dL — AB (ref 65–99)
Potassium: 4.1 mmol/L (ref 3.5–5.1)
Sodium: 137 mmol/L (ref 135–145)

## 2016-04-21 LAB — INFLUENZA PANEL BY PCR (TYPE A & B)
INFLBPCR: POSITIVE — AB
Influenza A By PCR: NEGATIVE

## 2016-04-21 MED ORDER — ACETAMINOPHEN 650 MG RE SUPP: 650.0000 mg | Freq: Four times a day (QID) | RECTAL | Status: DC | PRN

## 2016-04-21 MED ORDER — ACETAMINOPHEN 325 MG PO TABS: 650.0000 mg | ORAL_TABLET | Freq: Four times a day (QID) | ORAL | Status: DC | PRN

## 2016-04-21 MED ORDER — LORATADINE 10 MG PO TABS
10.0000 mg | ORAL_TABLET | Freq: Every day | ORAL | Status: DC
  Filled 2016-04-21: qty 1

## 2016-04-21 MED ORDER — SODIUM CHLORIDE 0.9 % IV BOLUS (SEPSIS)
500.0000 mL | Freq: Once | INTRAVENOUS | Status: AC
  Administered 2016-04-21: 500 mL via INTRAVENOUS

## 2016-04-21 MED ORDER — MAGNESIUM HYDROXIDE 400 MG/5ML PO SUSP: 30.0000 mL | ORAL | Status: DC | PRN

## 2016-04-21 MED ORDER — SODIUM CHLORIDE 0.9 % IV SOLN
INTRAVENOUS | Status: AC
  Administered 2016-04-21: via INTRAVENOUS

## 2016-04-21 MED ORDER — LEVOFLOXACIN 750 MG PO TABS
750.0000 mg | ORAL_TABLET | Freq: Once | ORAL | Status: AC
  Administered 2016-04-21: 750 mg via ORAL
  Filled 2016-04-21: qty 1

## 2016-04-21 MED ORDER — HYDROCODONE-ACETAMINOPHEN 5-325 MG PO TABS: 1.0000 | ORAL_TABLET | ORAL | Status: DC | PRN

## 2016-04-21 MED ORDER — IPRATROPIUM-ALBUTEROL 0.5-2.5 (3) MG/3ML IN SOLN: 3.0000 mL | RESPIRATORY_TRACT | Status: DC | PRN

## 2016-04-21 MED ORDER — GUAIFENESIN ER 600 MG PO TB12
600.0000 mg | ORAL_TABLET | Freq: Two times a day (BID) | ORAL | Status: DC
  Administered 2016-04-22 (×2): 600 mg via ORAL
  Filled 2016-04-21 (×2): qty 1

## 2016-04-21 MED ORDER — ALPRAZOLAM 0.5 MG PO TABS: 0.5000 mg | ORAL_TABLET | Freq: Three times a day (TID) | ORAL | Status: DC | PRN

## 2016-04-21 MED ORDER — HEPARIN SODIUM (PORCINE) 5000 UNIT/ML IJ SOLN
5000.0000 [IU] | Freq: Three times a day (TID) | INTRAMUSCULAR | Status: DC
  Administered 2016-04-22 (×2): 5000 [IU] via SUBCUTANEOUS
  Filled 2016-04-21 (×2): qty 1

## 2016-04-21 MED ORDER — OXYMETAZOLINE HCL 0.05 % NA SOLN
2.0000 | Freq: Two times a day (BID) | NASAL | Status: DC | PRN
  Filled 2016-04-21: qty 15

## 2016-04-21 MED ORDER — FLUTICASONE PROPIONATE 50 MCG/ACT NA SUSP
2.0000 | Freq: Every day | NASAL | Status: DC
  Administered 2016-04-22: 2 via NASAL
  Filled 2016-04-21: qty 16

## 2016-04-21 MED ORDER — ONDANSETRON HCL 4 MG/2ML IJ SOLN: 4.0000 mg | Freq: Four times a day (QID) | INTRAMUSCULAR | Status: DC | PRN

## 2016-04-21 MED ORDER — ONDANSETRON HCL 4 MG PO TABS: 4.0000 mg | ORAL_TABLET | Freq: Four times a day (QID) | ORAL | Status: DC | PRN

## 2016-04-21 MED ORDER — AMLODIPINE BESYLATE 10 MG PO TABS
10.0000 mg | ORAL_TABLET | Freq: Every day | ORAL | Status: DC
  Administered 2016-04-22: 10 mg via ORAL
  Filled 2016-04-21: qty 1

## 2016-04-21 MED ORDER — PROPAFENONE HCL 225 MG PO TABS
225.0000 mg | ORAL_TABLET | Freq: Three times a day (TID) | ORAL | Status: DC
  Administered 2016-04-22 (×3): 225 mg via ORAL
  Filled 2016-04-21 (×7): qty 1

## 2016-04-21 MED ORDER — PROPAFENONE HCL 150 MG PO TABS
225.0000 mg | ORAL_TABLET | Freq: Once | ORAL | Status: AC
  Administered 2016-04-21: 225 mg via ORAL
  Filled 2016-04-21: qty 2

## 2016-04-21 MED ORDER — PROSIGHT PO TABS
1.0000 | ORAL_TABLET | Freq: Two times a day (BID) | ORAL | Status: DC
  Administered 2016-04-22: 1 via ORAL
  Filled 2016-04-21 (×2): qty 1

## 2016-04-21 MED ORDER — OSELTAMIVIR PHOSPHATE 30 MG PO CAPS
30.0000 mg | ORAL_CAPSULE | Freq: Two times a day (BID) | ORAL | Status: DC
  Administered 2016-04-22 (×2): 30 mg via ORAL
  Filled 2016-04-21 (×5): qty 1

## 2016-04-21 MED ORDER — PANTOPRAZOLE SODIUM 40 MG PO TBEC
40.0000 mg | DELAYED_RELEASE_TABLET | Freq: Every day | ORAL | Status: DC
  Administered 2016-04-22: 40 mg via ORAL
  Filled 2016-04-21: qty 1

## 2016-04-21 MED ORDER — POLYETHYLENE GLYCOL 3350 17 G PO PACK: 17.0000 g | PACK | Freq: Every day | ORAL | Status: DC | PRN

## 2016-04-21 MED ORDER — ATORVASTATIN CALCIUM 40 MG PO TABS
40.0000 mg | ORAL_TABLET | Freq: Every day | ORAL | Status: DC
Start: 2016-04-22 — End: 2016-04-22

## 2016-04-21 MED ORDER — TAMSULOSIN HCL 0.4 MG PO CAPS
0.4000 mg | ORAL_CAPSULE | Freq: Every day | ORAL | Status: DC
  Administered 2016-04-22: 0.4 mg via ORAL
  Filled 2016-04-21: qty 1

## 2016-04-21 MED ORDER — ASPIRIN EC 81 MG PO TBEC
81.0000 mg | DELAYED_RELEASE_TABLET | Freq: Every day | ORAL | Status: DC
  Administered 2016-04-22: 81 mg via ORAL
  Filled 2016-04-21: qty 1

## 2016-04-21 NOTE — ED Provider Notes (Signed)
[facility] DEPT Provider Note   CSN: [id number] Arrival date & time: 04/21/16  1833  History   Chief Complaint Chief Complaint  Patient presents with  . Weakness  . Fever    HPI Stanley Hunter is a 83 y.o. male.  HPI  83 y.o. male with a hx of CKD Stage III, A Flutter with pacemaker placement, presents to the Emergency Department today complaining of productive cough and post nasal drip x 1 month. Notes fever at home with Tmax 101.45F today that started today. Rhinorrhea present. Arrived via EMS and given 1g Tylenol en route. Noted 88% O2 on RA by EMS. No N/V/D. No CP/SOB/ABD pain. No numbness/tingling. No diaphoresis. Notes seeing PCP on Friday and given Flonase and Mucinex with minimal relief. No pain currently. No other symptoms noted.     Past Medical History:  Diagnosis Date  . Acid reflux 08/03/2015  . Anxiety 08/03/2015  . Avitaminosis D 05/18/2012  . Benign essential HTN 02/09/2009   Overview:  Benign Essential Hypertension   . Benign prostatic hyperplasia with urinary obstruction 12/07/2013  . Chronic kidney disease (CKD), stage III (moderate) 11/04/2011  . CN (constipation) 08/03/2015  . Cognitive changes 08/07/2015  . Degenerative arthritis of hip 05/14/2013  . Degenerative arthritis of spine 05/14/2013  . Degenerative disorder of muscle 08/03/2015  . Dysrhythmia 2001   ablation for a-flutter  . H/O atrial flutter 08/03/2015   Overview:  Pacemaker placed 2015   . High blood pressure   . High cholesterol   . History of pacemaker 2016  . Hx of cardiac cath 2013  . Loss of balance 08/07/2015  . Lumbar canal stenosis 07/07/2013   Overview:  Lumbar laminectomy 07/07/13 - Dr. Lurene Shadow   . Lumbar scoliosis 05/14/2013  . Macular degeneration 08/07/2015  . Premature contractions, supraventricular 12/15/2008   Overview:  Atrial Premature Complex   . Presence of permanent cardiac pacemaker   . Tremor 08/07/2015  . Urinary frequency 08/07/2015    Patient Active Problem List   Diagnosis  Date Noted  . Edema 02/01/2016  . Pain 11/24/2015  . Trigger ring finger of left hand 11/16/2015  . Tremor 08/07/2015  . Loss of balance 08/07/2015  . Macular degeneration 08/07/2015  . Urinary frequency 08/07/2015  . Cognitive changes 08/07/2015  . History of pacemaker   . Anxiety 08/03/2015  . Constipation 08/03/2015  . Acid reflux 08/03/2015  . H/O atrial flutter 08/03/2015  . Myalgia 08/03/2015  . Benign prostatic hyperplasia with urinary obstruction 12/07/2013  . Sick sinus syndrome ([place]) 11/18/2013  . Lumbar canal stenosis 07/07/2013  . Degenerative arthritis of hip 05/14/2013  . Degenerative arthritis of spine 05/14/2013  . Lumbar scoliosis 05/14/2013  . Avitaminosis D 05/18/2012  . Chronic kidney disease (CKD), stage III (moderate) 11/04/2011  . Benign essential HTN 02/09/2009  . Premature contractions, supraventricular 12/15/2008  . Pure hypercholesterolemia 10/08/2006    Past Surgical History:  Procedure Laterality Date  . ATRIAL FLUTTER ABLATION  2001  . CATARACT EXTRACTION Bilateral 2015  . CHOLECYSTECTOMY    . MASTOIDECTOMY  1934   lower back   . PACEMAKER PLACEMENT  2016  . SPINE SURGERY  2016  . TONSILLECTOMY  1934  . TRIGGER FINGER RELEASE Left 12/14/2015   Procedure: RELEASE TRIGGER FINGER/A-1 PULLEY ring finger left;  Surgeon: Daryll Brod, MD;  Location: [facility];  Service: Orthopedics;  Laterality: Left;  FAB       Home Medications    Prior to Admission medications  Medication Sig Start Date End Date Taking? Authorizing Provider  ALPRAZolam Duanne Moron) 0.5 MG tablet Take 0.5 mg by mouth as needed for anxiety.    Historical Provider, MD  amLODipine (NORVASC) 10 MG tablet Take 1 tablet (10 mg total) by mouth daily. 02/22/16   Estill Dooms, MD  aspirin EC 81 MG tablet Take 81 mg by mouth daily.    Historical Provider, MD  atorvastatin (LIPITOR) 40 MG tablet Take 40 mg by mouth daily.    Historical Provider, MD  fluticasone (FLONASE)  50 MCG/ACT nasal spray Place 2 sprays into both nostrils daily. 04/18/16   Tiffany L Reed, DO  guaiFENesin (MUCINEX) 600 MG 12 hr tablet Take 1 tablet (600 mg total) by mouth 2 (two) times daily. 04/18/16   Tiffany L Reed, DO  magnesium hydroxide (MILK OF MAGNESIA) 800 MG/5ML suspension Take 30 mLs by mouth as needed for constipation.    Historical Provider, MD  Multiple Vitamins-Minerals (PRESERVISION AREDS) CAPS 2 by mouth daily    Historical Provider, MD  pantoprazole (PROTONIX) 40 MG tablet Take 40 mg by mouth daily.    Historical Provider, MD  propafenone (RYTHMOL) 150 MG tablet 1.5 tablets 4 times daily breakfast, lunch, dinner and at bedtime    Historical Provider, MD  tamsulosin (FLOMAX) 0.4 MG CAPS capsule Take 0.4 mg by mouth at bedtime.    Historical Provider, MD  VITAMIN D, ERGOCALCIFEROL, PO     Historical Provider, MD    Family History History reviewed. No pertinent family history.  Social History Social History  Substance Use Topics  . Smoking status: Former Smoker    Years: 50.00    Types: Cigarettes    Quit date: 08/03/1975  . Smokeless tobacco: Never Used     Comment: smoked 6 cig dialy   . Alcohol use 1.8 oz/week    3 Standard drinks or equivalent per week     Comment: 3 times a week     Allergies   Cortisone; Neomycin; and Sulfamethoxazole-trimethoprim   Review of Systems Review of Systems ROS reviewed and all are negative for acute change except as noted in the HPI.  Physical Exam Updated Vital Signs BP 127/58   Pulse 61   Temp 98.2 F (36.8 C) (Oral)   Resp 16   SpO2 93%   Physical Exam  Constitutional: He is oriented to person, place, and time. He appears well-developed and well-nourished. No distress.  HENT:  Head: Normocephalic and atraumatic.  Right Ear: Tympanic membrane, external ear and ear canal normal.  Left Ear: Tympanic membrane, external ear and ear canal normal.  Nose: Nose normal.  Mouth/Throat: Uvula is midline, oropharynx is clear  and moist and mucous membranes are normal. No trismus in the jaw. No oropharyngeal exudate, posterior oropharyngeal erythema or tonsillar abscesses.  Eyes: EOM are normal. Pupils are equal, round, and reactive to light.  Neck: Normal range of motion. Neck supple. No tracheal deviation present.  Cardiovascular: Normal rate, regular rhythm, S1 normal, S2 normal, normal heart sounds, intact distal pulses and normal pulses.   Pulmonary/Chest: Effort normal and breath sounds normal. No respiratory distress. He has no decreased breath sounds. He has no wheezes. He has no rhonchi. He has no rales.  Abdominal: Normal appearance and bowel sounds are normal. There is no tenderness.  Musculoskeletal: Normal range of motion.  Neurological: He is alert and oriented to person, place, and time.  Skin: Skin is warm and dry.  Psychiatric: He has a normal mood and affect.  His speech is normal and behavior is normal. Thought content normal.   ED Treatments / Results  Labs (all labs ordered are listed, but only abnormal results are displayed) Labs Reviewed  BASIC METABOLIC PANEL - Abnormal; Notable for the following:       Result Value   Glucose, Bld 123 (*)    BUN 23 (*)    Creatinine, Ser 1.50 (*)    Calcium 8.5 (*)    GFR calc non Af Amer 40 (*)    GFR calc Af Amer 46 (*)    All other components within normal limits  CBC WITH DIFFERENTIAL/PLATELET - Abnormal; Notable for the following:    RBC 3.80 (*)    Hemoglobin 11.9 (*)    HCT 34.7 (*)    Platelets 140 (*)    All other components within normal limits  INFLUENZA PANEL BY PCR (TYPE A & B)    EKG  EKG Interpretation None       Radiology Dg Chest 2 View  Result Date: 04/21/2016 CLINICAL DATA:  Persistent cough for 2 months.  Hypoxia. EXAM: CHEST  2 VIEW COMPARISON:  None. FINDINGS: Borderline mild cardiomegaly. Tortuous thoracic aorta with atherosclerosis. No pulmonary edema. Dual lead left-sided pacemaker with leads overlying the right  atrium and ventricle. Streaky bibasilar opacities, right greater than left, likely atelectasis or scarring. No confluent airspace disease. No definite pleural fluid. No pneumothorax. Age related degenerative change in the thoracic spine. IMPRESSION: 1. Borderline cardiomegaly. Thoracic aortic atherosclerosis. No evidence of congestive failure. 2. Streaky bibasilar opacities, right greater than left, likely atelectasis or scarring. Electronically Signed   By: Jeb Levering M.D.   On: 04/21/2016 19:43    Procedures Procedures (including critical care time)  Medications Ordered in ED Medications  levofloxacin (LEVAQUIN) tablet 750 mg (not administered)  sodium chloride 0.9 % bolus 500 mL (500 mLs Intravenous New Bag/Given 04/21/16 1944)  propafenone (RYTHMOL) tablet 225 mg (225 mg Oral Given 04/21/16 2017)     Initial Impression / Assessment and Plan / ED Course  I have reviewed the triage vital signs and the nursing notes.  Pertinent labs & imaging results that were available during my care of the patient were reviewed by me and considered in my medical decision making (see chart for details).  Final Clinical Impressions(s) / ED Diagnoses  {I have reviewed and evaluated the relevant laboratory values. {I have reviewed and evaluated the relevant imaging studies.  {I have reviewed the relevant previous healthcare records.  {I obtained HPI from historian. {Patient discussed with supervising physician.  ED Course:  Assessment: Pt is a 84 y.o. male with hx CKD Stage III, A Flutter with pacemaker placement who presents with URI symptoms x 1 month. Fever today. No N/V/D. Noted hypoxia via EMS with 88% O2 sat. On exam, pt in NAD. Nontoxic/nonseptic appearing. VSS. Afebrile. Lungs CTA. Heart RRR. Abdomen nontender soft. CBC unremarkable. No leukocytosis. BMP unremarkable. CXR with possible opacity in right base. Given fluids in ED as well as Levaquin for CAP. Also Influenza positive. Continued hypoxia  with ambulation requiring O2 on 2L [place]. Will admit to medicine.  Disposition/Plan:  Admit Pt acknowledges and agrees with plan  Supervising Physician Jola Schmidt, MD  Final diagnoses:  Community acquired pneumonia of right lower lobe of lung [facility])  Influenza B    New Prescriptions New Prescriptions   No medications on file       Shary Decamp, Hershal Coria 04/21/16 2147    Jola Schmidt, MD 04/22/16  0022  

## 2016-04-21 NOTE — ED Notes (Addendum)
Pt. Ambulated down the hall and back to his room on 93% room air and 67 heart rate. Pt. Redirected back to his bed and oxygen level dropped to 86% room air and pt. Placed back on 2 liters oxygen and oxygen came back up to 95%.

## 2016-04-21 NOTE — ED Triage Notes (Addendum)
Pt c/o productive cough with clear mucus and postnasal drip x 1 month, fever and weakness onset today. Reports postnasal drip x several weak. Temp 101.2 at home, EMS administered 1 g Tylenol. O2 88% on RA with EMS.

## 2016-04-21 NOTE — H&P (Signed)
History and Physical    Stanley Hunter [id number] DOB: [birth year] DOA: 04/21/2016  PCP: Jeanmarie Hubert, MD   Patient coming from: Home  Chief Complaint: Fever, cough, rhinorrhea, gen weakness  HPI: Stanley Hunter is a 83 y.o. male with medical history significant for hypertension, chronic kidney disease stage III, sick sinus syndrome with pacer, who presents to the emergency department for evaluation of fevers, cough, and generalized weakness. Patient reports suffering from post nasal drip for more than a month, has been evaluated by his PCP for this and treated for allergic rhinitis. Despite treatment with intranasal steroid and oral antihistamine, patient continued to experience PND and a "scratchy" throat. There was no dyspnea and no fevers or malaise, however, until yesterday. Patient was very weak generally yesterday, requiring assistance to get him up out of bed, which was unusual. Vitals were obtained at that time and the patient was noted to be febrile. He denied any chest pain or palpitations and denied any lower extremity swelling or tenderness. He has had a cough productive of thick yellow and green sputum. EMS was called for transport to the hospital and the patient was found to be saturating 88% on room air and placed on supplemental oxygen. He had a fever of  101.2 F and was given 1 g of acetaminophen by EMS.  ED Course: Upon arrival to the ED, patient is found to be afebrile, saturating adequately on room air, and with vitals otherwise stable. Chest x-ray is notable for borderline cardiomegaly without evidence for CHF, and streaky by basilar opacity consistent with atelectasis or scar. Chemistry panels notable for a serum creatinine of 1.50, only slightly up from his apparent baseline. CBC features a hemoglobin of 11.9, down from 13.6 in May 2017, and a mild thrombocytopenia with platelets 140,000. Influenza PCR is positive for influenza B. Patient was given a 500 mL normal saline bolus and  treated with Levaquin in the emergency department. Patient was ambulated in the ED, but became significantly dyspneic and weak, and his oxygen saturation dropped into the 80s. He has remained hemodynamically stable and will be  Observed on medical or surgical unit for ongoing evaluation and management of hypoxia with fevers, cough, and generalized weakness suspected secondary to influenza.  Review of Systems:  All other systems reviewed and apart from HPI, are negative.  Past Medical History:  Diagnosis Date  . Acid reflux 08/03/2015  . Anxiety 08/03/2015  . Avitaminosis D 05/18/2012  . Benign essential HTN 02/09/2009   Overview:  Benign Essential Hypertension   . Benign prostatic hyperplasia with urinary obstruction 12/07/2013  . Chronic kidney disease (CKD), stage III (moderate) 11/04/2011  . CN (constipation) 08/03/2015  . Cognitive changes 08/07/2015  . Degenerative arthritis of hip 05/14/2013  . Degenerative arthritis of spine 05/14/2013  . Degenerative disorder of muscle 08/03/2015  . Dysrhythmia 2001   ablation for a-flutter  . H/O atrial flutter 08/03/2015   Overview:  Pacemaker placed 2015   . High blood pressure   . High cholesterol   . History of pacemaker 2016  . Hx of cardiac cath 2013  . Loss of balance 08/07/2015  . Lumbar canal stenosis 07/07/2013   Overview:  Lumbar laminectomy 07/07/13 - Dr. Lurene Shadow   . Lumbar scoliosis 05/14/2013  . Macular degeneration 08/07/2015  . Premature contractions, supraventricular 12/15/2008   Overview:  Atrial Premature Complex   . Presence of permanent cardiac pacemaker   . Tremor 08/07/2015  . Urinary frequency 08/07/2015    Past Surgical History:  Procedure Laterality Date  . ATRIAL FLUTTER ABLATION  2001  . CATARACT EXTRACTION Bilateral 2015  . CHOLECYSTECTOMY    . MASTOIDECTOMY  1934   lower back   . PACEMAKER PLACEMENT  2016  . SPINE SURGERY  2016  . TONSILLECTOMY  1934  . TRIGGER FINGER RELEASE Left 12/14/2015   Procedure: RELEASE  TRIGGER FINGER/A-1 PULLEY ring finger left;  Surgeon: Daryll Brod, MD;  Location: [place];  Service: Orthopedics;  Laterality: Left;  FAB     reports that he quit smoking about 40 years ago. His smoking use included Cigarettes. He quit after 50.00 years of use. He has never used smokeless tobacco. He reports that he drinks about 1.8 oz of alcohol per week . He reports that he does not use drugs.  Allergies  Allergen Reactions  . Cortisone Other (See Comments)  . Neomycin Rash  . Sulfamethoxazole-Trimethoprim Other (See Comments)    Leg weakness    History reviewed. No pertinent family history.   Prior to Admission medications   Medication Sig Start Date End Date Taking? Authorizing Provider  ALPRAZolam Duanne Moron) 0.5 MG tablet Take 0.5 mg by mouth as needed for anxiety.   Yes Historical Provider, MD  amLODipine (NORVASC) 10 MG tablet Take 1 tablet (10 mg total) by mouth daily. 02/22/16  Yes Estill Dooms, MD  aspirin EC 81 MG tablet Take 81 mg by mouth daily.   Yes Historical Provider, MD  atorvastatin (LIPITOR) 40 MG tablet Take 40 mg by mouth daily.   Yes Historical Provider, MD  fluticasone (FLONASE) 50 MCG/ACT nasal spray Place 2 sprays into both nostrils daily. 04/18/16  Yes Tiffany L Reed, DO  guaiFENesin (MUCINEX) 600 MG 12 hr tablet Take 1 tablet (600 mg total) by mouth 2 (two) times daily. 04/18/16  Yes Tiffany L Reed, DO  loratadine (CLARITIN) 10 MG tablet Take 10 mg by mouth daily.   Yes Historical Provider, MD  magnesium hydroxide (MILK OF MAGNESIA) 800 MG/5ML suspension Take 30 mLs by mouth as needed for constipation.   Yes Historical Provider, MD  Multiple Vitamins-Minerals (PRESERVISION AREDS) CAPS Take 1 capsule by mouth 2 (two) times daily. 2 by mouth daily    Yes Historical Provider, MD  pantoprazole (PROTONIX) 40 MG tablet Take 40 mg by mouth daily.   Yes Historical Provider, MD  propafenone (RYTHMOL) 150 MG tablet Take 225 mg by mouth 4 (four) times daily.  1.5 tablets 4 times daily breakfast, lunch, dinner and at bedtime    Yes Historical Provider, MD  tamsulosin (FLOMAX) 0.4 MG CAPS capsule Take 0.4 mg by mouth at bedtime.   Yes Historical Provider, MD  VITAMIN D, ERGOCALCIFEROL, PO Take 1 tablet by mouth daily.    Yes Historical Provider, MD    Physical Exam: Vitals:   04/21/16 1900 04/21/16 1901 04/21/16 2138 04/21/16 2301  BP: 135/56  115/58 (!) 144/62  Pulse: (!) 59  82 60  Resp: 16  18 18   Temp:    98.4 F (36.9 C)  TempSrc:    Oral  SpO2: 91% 93% 93% 98%      Constitutional: No acute distress, calm, comfortable. Coughing.  Eyes: PERTLA, lids and conjunctivae normal ENMT: Mucous membranes are moist. Posterior pharynx clear of any exudate or lesions.   Neck: normal, supple, no masses, no thyromegaly Respiratory: Coarse upper airway noise, no rhonchi or wheeze. No accessory muscle use.  Cardiovascular: S1 & S2 heard, regular rate and rhythm. No extremity edema. No significant  JVD. Abdomen: No distension, no tenderness, no masses palpated. Bowel sounds normal.  Musculoskeletal: no clubbing / cyanosis. No joint deformity upper and lower extremities.  Skin: no significant rashes, lesions, ulcers. Warm, dry, well-perfused. Neurologic: CN 2-12 grossly intact. Sensation intact, DTR normal. Strength 5/5 in all 4 limbs.  Psychiatric: Alert and oriented x 3. Normal mood and affect.     Labs on Admission: I have personally reviewed following labs and imaging studies  CBC:  Recent Labs Lab 04/21/16 1859  WBC 6.6  NEUTROABS 4.5  HGB 11.9*  HCT 34.7*  MCV 91.3  PLT [id number]*   Basic Metabolic Panel:  Recent Labs Lab 04/21/16 1859  NA 137  K 4.1  CL 106  CO2 22  GLUCOSE 123*  BUN 23*  CREATININE 1.50*  CALCIUM 8.5*   GFR: Estimated Creatinine Clearance: 33.4 mL/min (by C-G formula based on SCr of 1.5 mg/dL (H)). Liver Function Tests: No results for input(s): AST, ALT, ALKPHOS, BILITOT, PROT, ALBUMIN in the last 168  hours. No results for input(s): LIPASE, AMYLASE in the last 168 hours. No results for input(s): AMMONIA in the last 168 hours. Coagulation Profile: No results for input(s): INR, PROTIME in the last 168 hours. Cardiac Enzymes: No results for input(s): CKTOTAL, CKMB, CKMBINDEX, TROPONINI in the last 168 hours. BNP (last 3 results) No results for input(s): PROBNP in the last 8760 hours. HbA1C: No results for input(s): HGBA1C in the last 72 hours. CBG: No results for input(s): GLUCAP in the last 168 hours. Lipid Profile: No results for input(s): CHOL, HDL, LDLCALC, TRIG, CHOLHDL, LDLDIRECT in the last 72 hours. Thyroid Function Tests: No results for input(s): TSH, T4TOTAL, FREET4, T3FREE, THYROIDAB in the last 72 hours. Anemia Panel: No results for input(s): VITAMINB12, FOLATE, FERRITIN, TIBC, IRON, RETICCTPCT in the last 72 hours. Urine analysis: No results found for: COLORURINE, APPEARANCEUR, LABSPEC, PHURINE, GLUCOSEU, HGBUR, BILIRUBINUR, KETONESUR, PROTEINUR, UROBILINOGEN, NITRITE, LEUKOCYTESUR Sepsis Labs: @LABRCNTIP (procalcitonin:4,lacticidven:4) )No results found for this or any previous visit (from the past 240 hour(s)).   Radiological Exams on Admission: Dg Chest 2 View  Result Date: 04/21/2016 CLINICAL DATA:  Persistent cough for 2 months.  Hypoxia. EXAM: CHEST  2 VIEW COMPARISON:  None. FINDINGS: Borderline mild cardiomegaly. Tortuous thoracic aorta with atherosclerosis. No pulmonary edema. Dual lead left-sided pacemaker with leads overlying the right atrium and ventricle. Streaky bibasilar opacities, right greater than left, likely atelectasis or scarring. No confluent airspace disease. No definite pleural fluid. No pneumothorax. Age related degenerative change in the thoracic spine. IMPRESSION: 1. Borderline cardiomegaly. Thoracic aortic atherosclerosis. No evidence of congestive failure. 2. Streaky bibasilar opacities, right greater than left, likely atelectasis or scarring.  Electronically Signed   By: Jeb Levering M.D.   On: 04/21/2016 19:43    EKG: Not performed, will obtain as appropriate.   Assessment/Plan  1. Influenza, hypoxia  - Presents with fevers, gen weakness, malaise, cough, upper respiratory sxs  - Hypoxic and febrile on EMS arrival, given 1 g APAP and supplemental O2 PTA  - No leukocytosis, no apparent PNA on CXR  - Influenza B positive  - Start Tamiflu, continue supplemental O2 as needed, supportive care with APAP, prn nebs - Infection-control measures with droplet precautions  2. CKD stage III  - SCr is 1.50 on admission, only slightly up from apparent baseline of 1.4  - He appeared dry and was given 500 cc NS in ED; he is continued on gentle IVF hydration   - Avoid nephrotoxins, renally-dose medications, repeat chem panel  in am   3. Sick sinus syndrome - Has pacemaker in place, appears to be stable    4. Hypertension  - BP at goal  - Continue Norvasc as tolerated   5. GERD - No EGD report on file; appears to be stable - Managed with daily Protonix at home, will continue    DVT prophylaxis: sq heparin  Code Status: Full  Family Communication: Wife updated at bedside Disposition Plan: Observe on med-surg Consults called: None Admission status: Observation    Vianne Bulls, MD [facility] Pager [phone]  If 7PM-7AM, please contact night-coverage [email or web address] Password [email or web address]  04/21/2016, 11:19 PM

## 2016-04-21 NOTE — ED Notes (Signed)
Bed: [id number] Expected date:  Expected time:  Means of arrival:  Comments: 83 yo fever; cough

## 2016-04-22 DIAGNOSIS — N183 Chronic kidney disease, stage 3 (moderate): Secondary | ICD-10-CM | POA: Diagnosis not present

## 2016-04-22 DIAGNOSIS — I10 Essential (primary) hypertension: Secondary | ICD-10-CM

## 2016-04-22 DIAGNOSIS — K219 Gastro-esophageal reflux disease without esophagitis: Secondary | ICD-10-CM | POA: Diagnosis not present

## 2016-04-22 DIAGNOSIS — F419 Anxiety disorder, unspecified: Secondary | ICD-10-CM

## 2016-04-22 DIAGNOSIS — R531 Weakness: Secondary | ICD-10-CM | POA: Diagnosis not present

## 2016-04-22 DIAGNOSIS — I495 Sick sinus syndrome: Secondary | ICD-10-CM

## 2016-04-22 DIAGNOSIS — J101 Influenza due to other identified influenza virus with other respiratory manifestations: Secondary | ICD-10-CM

## 2016-04-22 DIAGNOSIS — J9601 Acute respiratory failure with hypoxia: Secondary | ICD-10-CM | POA: Diagnosis not present

## 2016-04-22 DIAGNOSIS — J9600 Acute respiratory failure, unspecified whether with hypoxia or hypercapnia: Secondary | ICD-10-CM | POA: Diagnosis not present

## 2016-04-22 LAB — BASIC METABOLIC PANEL
Anion gap: 5 (ref 5–15)
BUN: 21 mg/dL — AB (ref 6–20)
CHLORIDE: 108 mmol/L (ref 101–111)
CO2: 25 mmol/L (ref 22–32)
Calcium: 8.1 mg/dL — ABNORMAL LOW (ref 8.9–10.3)
Creatinine, Ser: 1.48 mg/dL — ABNORMAL HIGH (ref 0.61–1.24)
GFR calc Af Amer: 47 mL/min — ABNORMAL LOW (ref >60)
GFR calc non Af Amer: 40 mL/min — ABNORMAL LOW (ref >60)
GLUCOSE: 93 mg/dL (ref 65–99)
POTASSIUM: 3.9 mmol/L (ref 3.5–5.1)
Sodium: 138 mmol/L (ref 135–145)

## 2016-04-22 LAB — GLUCOSE, CAPILLARY: Glucose-Capillary: 90 mg/dL (ref 65–99)

## 2016-04-22 MED ORDER — GUAIFENESIN-DM 100-10 MG/5ML PO SYRP: 10.0000 mL | ORAL_SOLUTION | ORAL | 0 refills | Status: DC | PRN

## 2016-04-22 MED ORDER — OSELTAMIVIR PHOSPHATE 30 MG PO CAPS: 30.0000 mg | ORAL_CAPSULE | Freq: Two times a day (BID) | ORAL | 0 refills | Status: AC

## 2016-04-22 MED ORDER — PHENOL 1.4 % MT LIQD
1.0000 | OROMUCOSAL | Status: DC | PRN
  Administered 2016-04-22: 1 via OROMUCOSAL
  Filled 2016-04-22: qty 177

## 2016-04-22 MED ORDER — ALPRAZOLAM 0.5 MG PO TABS: 0.5000 mg | ORAL_TABLET | ORAL | 0 refills | Status: DC | PRN

## 2016-04-22 MED ORDER — GUAIFENESIN-DM 100-10 MG/5ML PO SYRP: 10.0000 mL | ORAL_SOLUTION | ORAL | Status: DC | PRN

## 2016-04-22 MED ORDER — ONDANSETRON HCL 4 MG PO TABS: 4.0000 mg | ORAL_TABLET | Freq: Four times a day (QID) | ORAL | 0 refills | Status: DC | PRN

## 2016-04-22 NOTE — Evaluation (Addendum)
Physical Therapy Evaluation Patient Details Name: Stanley Hunter MRN: [id number] DOB: 16-Feb-1925 Today's Date: 04/22/2016   History of Present Illness  83 y.o. male with medical history significant for hypertension, chronic kidney disease stage III, sick sinus syndrome with pacer, who presents to the emergency department for evaluation of fevers, cough, and generalized weakness. Dx of flu, CKD.  Clinical Impression  Pt admitted with above diagnosis. Pt currently with functional limitations due to the deficits listed below (see PT Problem List). Pt ambulated 74' with RW, SaO2 94% on RA, 2/4 dyspnea. Activity tolerance is limited by fatigue, good progress expected. Pt will benefit from skilled PT to increase their independence and safety with mobility to allow discharge to the venue listed below.    RN notified PT that later in the day, pt required significant assistance to stand and was unsteady ambulating to bathroom. PT now recommending ST-SNF.      Follow Up Recommendations SNF    Equipment Recommendations  None recommended by PT    Recommendations for Other Services       Precautions / Restrictions Precautions Precautions: Fall Precaution Comments: fell just PTA, no other falls in past 1 year Restrictions Weight Bearing Restrictions: No      Mobility  Bed Mobility Overal bed mobility: Modified Independent             General bed mobility comments: HOB up 30*, increased time  Transfers Overall transfer level: Needs assistance Equipment used: Rolling walker (2 wheeled) Transfers: Sit to/from Stand Sit to Stand: Supervision         General transfer comment: VCs hand placement  Ambulation/Gait Ambulation/Gait assistance: Supervision Ambulation Distance (Feet): 80 Feet Assistive device: Rolling walker (2 wheeled) Gait Pattern/deviations: Step-through pattern;Decreased step length - right;Decreased step length - left;Trunk flexed   Gait velocity interpretation:  Below normal speed for age/gender General Gait Details: steady, distance limited by fatigue, 2/4 dyspnea, SaO2 94% on RA, VCs for posture and positioning in RW  Stairs            Wheelchair Mobility    Modified Rankin (Stroke Patients Only)       Balance Overall balance assessment: Modified Independent                                           Pertinent Vitals/Pain Pain Assessment:  (sore throat, RN aware)    Home Living Family/patient expects to be discharged to:: Private residence Living Arrangements: Spouse/significant other Available Help at Discharge: Family;Available 24 hours/day Type of Home: Independent living facility ([facility]) Home Access: Stairs to enter Entrance Stairs-Rails: None Entrance Stairs-Number of Steps: 1+1 Home Layout: One level Home Equipment: Environmental consultant - 2 wheels;Walker - 4 wheels;Shower seat      Prior Function Level of Independence: Independent with assistive device(s)         Comments: uses RW for long distances, no AD in home, independent ADLs, drives     Hand Dominance        Extremity/Trunk Assessment   Upper Extremity Assessment Upper Extremity Assessment: Overall WFL for tasks assessed    Lower Extremity Assessment Lower Extremity Assessment: Overall WFL for tasks assessed    Cervical / Trunk Assessment Cervical / Trunk Assessment: Kyphotic (forward head)  Communication   Communication: No difficulties  Cognition Arousal/Alertness: Awake/alert Behavior During Therapy: WFL for tasks assessed/performed Overall Cognitive Status: Within Functional Limits for tasks  assessed                      General Comments      Exercises     Assessment/Plan    PT Assessment Patient needs continued PT services  PT Problem List Decreased activity tolerance;Cardiopulmonary status limiting activity;Decreased mobility       PT Treatment Interventions Gait training;DME instruction;Therapeutic  activities;Therapeutic exercise;Functional mobility training;Patient/family education;Balance training    PT Goals (Current goals can be found in the Care Plan section)  Acute Rehab PT Goals Patient Stated Goal: return to ILF, likes to watch tv PT Goal Formulation: With patient Time For Goal Achievement: 05/06/16 Potential to Achieve Goals: Good    Frequency Min 3X/week   Barriers to discharge        Co-evaluation               End of Session Equipment Utilized During Treatment: Gait belt Activity Tolerance: Patient tolerated treatment well Patient left: in chair;with call bell/phone within reach;with chair alarm set Nurse Communication: Mobility status PT Visit Diagnosis: Difficulty in walking, not elsewhere classified (R26.2)    Functional Assessment Tool Used: AM-PAC 6 Clicks Basic Mobility;Clinical judgement Functional Limitation: Mobility: Walking and moving around Mobility: Walking and Moving Around Current Status [id number]): At least 20 percent but less than 40 percent impaired, limited or restricted Mobility: Walking and Moving Around Goal Status [phone]): At least 1 percent but less than 20 percent impaired, limited or restricted    Time: 1213-1237 PT Time Calculation (min) (ACUTE ONLY): 24 min   Charges:   PT Evaluation $PT Eval Low Complexity: 1 Procedure PT Treatments $Gait Training: 8-22 mins   PT G Codes:   PT G-Codes **NOT FOR INPATIENT CLASS** Functional Assessment Tool Used: AM-PAC 6 Clicks Basic Mobility;Clinical judgement Functional Limitation: Mobility: Walking and moving around Mobility: Walking and Moving Around Current Status [id number]): At least 20 percent but less than 40 percent impaired, limited or restricted Mobility: Walking and Moving Around Goal Status [phone]): At least 1 percent but less than 20 percent impaired, limited or restricted     Stanley Hunter 04/22/2016, 12:58 PM [phone]

## 2016-04-22 NOTE — Progress Notes (Signed)
Report given to Royetta Crochet Nurse at [facility]. All questions answered appropriately.

## 2016-04-22 NOTE — Discharge Instructions (Signed)
Stanley Hunter,  You were in the hospital because of need for wound infection which caused obstruction levels to be decreased. He required oxygen which improved. Physical therapy evaluated you and recommended home health physical therapy. He'll go home with a prescription for Tamiflu in addition to some [place]. Follow-up with her primary care physician this week. It was a pleasure meeting you and your wife.  Sincerely, Cordelia Poche, M.D. [facility]

## 2016-04-22 NOTE — Progress Notes (Signed)
CSW assisting with d/c planning. Pt from [place] at [facility] was planning to return to independent apt with HHPT today. Due to pt's increase need for assistance PT is recommending ST Rehab. [place] contacted. Awaiting return call to coordinate SNF placement. CSW will follow to assist with d/c planning.  Werner Lean LCSW [phone]

## 2016-04-22 NOTE — Progress Notes (Signed)
Pt from independent living [facility]. This CM spoke with Renita Papa from [facility] who states that they are able to provide PT/OT to pt at the facility. They would only need HHPT/OT orders entered by MD into Epic as they have Epic access. Will alert MD. No other CM needs communicated.  Marney Doctor RN,BSN,NCM [phone]

## 2016-04-22 NOTE — Progress Notes (Signed)
Patient d/c to SNF via PTAR. Stable on d/c. Wife notified on patient's d/c,also provided d/c summary with the wife,verbalized understanding.

## 2016-04-22 NOTE — Progress Notes (Signed)
Patient unable to stand up from recliner chair with the use of walker. This nurse had to call a second person to help him stood up, felt weak per patient. Vital signs obtained. MD aware. PT notified regarding this event.

## 2016-04-22 NOTE — Progress Notes (Signed)
Patient weaned to RA,O2 sat was 94%.

## 2016-04-22 NOTE — Progress Notes (Signed)
PT Update  Per RN, pt was unsteady ambulating to the bathroom with his wife and required significant assistance to stand this afternoon. PT now recommending ST-SNF.   Blondell Reveal Kistler PT 04/22/2016  [phone]

## 2016-04-22 NOTE — Discharge Summary (Addendum)
Physician Discharge Summary  Stanley Hunter [id number] DOB: [birth year] DOA: 04/21/2016  PCP: Jeanmarie Hubert, MD  Admit date: 04/21/2016 Discharge date: 04/22/2016  Admitted From: Independent living Disposition: SNF  Recommendations for Outpatient Follow-up:  1. Follow up with PCP in this week 2. Please follow up on the following pending results: Blood cultures; sputum culture  Home Health: Physical therapy Equipment/Devices: None  Discharge Condition: Stable CODE STATUS: Full code Diet recommendation: Heart healthy   Brief/Interim Summary:  Admission HPI written by Mitzi Hansen   Chief Complaint: Fever, cough, rhinorrhea, gen weakness  HPI: Stanley Hunter is a 83 y.o. male with medical history significant for hypertension, chronic kidney disease stage III, sick sinus syndrome with pacer, who presents to the emergency department for evaluation of fevers, cough, and generalized weakness. Patient reports suffering from post nasal drip for more than a month, has been evaluated by his PCP for this and treated for allergic rhinitis. Despite treatment with intranasal steroid and oral antihistamine, patient continued to experience PND and a "scratchy" throat. There was no dyspnea and no fevers or malaise, however, until yesterday. Patient was very weak generally yesterday, requiring assistance to get him up out of bed, which was unusual. Vitals were obtained at that time and the patient was noted to be febrile. He denied any chest pain or palpitations and denied any lower extremity swelling or tenderness. He has had a cough productive of thick yellow and green sputum. EMS was called for transport to the hospital and the patient was found to be saturating 88% on room air and placed on supplemental oxygen. He had a fever of  101.2 F and was given 1 g of acetaminophen by EMS.  ED Course: Upon arrival to the ED, patient is found to be afebrile, saturating adequately on room air, and with vitals  otherwise stable. Chest x-ray is notable for borderline cardiomegaly without evidence for CHF, and streaky by basilar opacity consistent with atelectasis or scar. Chemistry panels notable for a serum creatinine of 1.50, only slightly up from his apparent baseline. CBC features a hemoglobin of 11.9, down from 13.6 in May 2017, and a mild thrombocytopenia with platelets 140,000. Influenza PCR is positive for influenza B. Patient was given a 500 mL normal saline bolus and treated with Levaquin in the emergency department. Patient was ambulated in the ED, but became significantly dyspneic and weak, and his oxygen saturation dropped into the 80s. He has remained hemodynamically stable and will be  Observed on medical or surgical unit for ongoing evaluation and management of hypoxia with fevers, cough, and generalized weakness suspected secondary to influenza.    Hospital course:  Acute respiratory failure with hypoxia Initially current oxygen via nasal cannula. Patient able to wean to room air. Ambulated with physical therapy without oxygen needs. Secondary to influenza infection. Evaluated by PT and recommended SNF.  Influenza B infection Tamiflu started. Patient with improvement of symptoms before discharge.  Chronic kidney disease stage III Stable.  Sick sinus syndrome Stable. Has pacemaker  Essential hypertension Stable slightly on the softer side. Continued Norvasc.  GERD Stable. Continued Protonix.  Discharge Diagnoses:  Principal Problem:   Influenza B Active Problems:   Anxiety   Chronic kidney disease (CKD), stage III (moderate)   Benign essential HTN   Acid reflux   Sick sinus syndrome (HCC)   Acute respiratory failure with hypoxia [facility])    Discharge Instructions   Allergies as of 04/22/2016      Reactions   Cortisone Other (See  Comments)   Neomycin Rash   Sulfamethoxazole-trimethoprim Other (See Comments)   Leg weakness      Medication List    STOP taking these  medications   guaiFENesin 600 MG 12 hr tablet Commonly known as:  MUCINEX     TAKE these medications   ALPRAZolam 0.5 MG tablet Commonly known as:  XANAX Take 0.5 mg by mouth as needed for anxiety.   amLODipine 10 MG tablet Commonly known as:  NORVASC Take 1 tablet (10 mg total) by mouth daily.   aspirin EC 81 MG tablet Take 81 mg by mouth daily.   atorvastatin 40 MG tablet Commonly known as:  LIPITOR Take 40 mg by mouth daily.   fluticasone 50 MCG/ACT nasal spray Commonly known as:  FLONASE Place 2 sprays into both nostrils daily.   guaiFENesin-dextromethorphan 100-10 MG/5ML syrup Commonly known as:  ROBITUSSIN DM Take 10 mLs by mouth every 4 (four) hours as needed for cough.   loratadine 10 MG tablet Commonly known as:  CLARITIN Take 10 mg by mouth daily.   magnesium hydroxide 800 MG/5ML suspension Commonly known as:  MILK OF MAGNESIA Take 30 mLs by mouth as needed for constipation.   ondansetron 4 MG tablet Commonly known as:  ZOFRAN Take 1 tablet (4 mg total) by mouth every 6 (six) hours as needed for nausea.   oseltamivir 30 MG capsule Commonly known as:  TAMIFLU Take 1 capsule (30 mg total) by mouth 2 (two) times daily. Start taking on:  04/23/2016   pantoprazole 40 MG tablet Commonly known as:  PROTONIX Take 40 mg by mouth daily.   PRESERVISION AREDS Caps Take 1 capsule by mouth 2 (two) times daily. 2 by mouth daily   propafenone 150 MG tablet Commonly known as:  RYTHMOL Take 225 mg by mouth 4 (four) times daily. 1.5 tablets 4 times daily breakfast, lunch, dinner and at bedtime   tamsulosin 0.4 MG Caps capsule Commonly known as:  FLOMAX Take 0.4 mg by mouth at bedtime.   VITAMIN D (ERGOCALCIFEROL) PO Take 1 tablet by mouth daily.      Follow-up Information    Schedule an appointment as soon as possible for a visit with Jeanmarie Hubert, MD.   Specialty:  Internal Medicine Contact information: [place]  [address] [phone]          Allergies  Allergen Reactions  . Cortisone Other (See Comments)  . Neomycin Rash  . Sulfamethoxazole-Trimethoprim Other (See Comments)    Leg weakness    Consultations:  None   Procedures/Studies: Dg Chest 2 View  Result Date: 04/21/2016 CLINICAL DATA:  Persistent cough for 2 months.  Hypoxia. EXAM: CHEST  2 VIEW COMPARISON:  None. FINDINGS: Borderline mild cardiomegaly. Tortuous thoracic aorta with atherosclerosis. No pulmonary edema. Dual lead left-sided pacemaker with leads overlying the right atrium and ventricle. Streaky bibasilar opacities, right greater than left, likely atelectasis or scarring. No confluent airspace disease. No definite pleural fluid. No pneumothorax. Age related degenerative change in the thoracic spine. IMPRESSION: 1. Borderline cardiomegaly. Thoracic aortic atherosclerosis. No evidence of congestive failure. 2. Streaky bibasilar opacities, right greater than left, likely atelectasis or scarring. Electronically Signed   By: Jeb Levering M.D.   On: 04/21/2016 19:43      Subjective: Patient reports no dyspnea or chest pain.  Discharge Exam: Vitals:   04/22/16 0442 04/22/16 0928  BP: (!) 141/63 (!) 128/55  Pulse: 64   Resp: 18   Temp: 98.8 F (37.1 C)  Vitals:   04/22/16 0015 04/22/16 0442 04/22/16 0928 04/22/16 1003  BP:  (!) 141/63 (!) 128/55   Pulse:  64    Resp:  18    Temp:  98.8 F (37.1 C)    TempSrc:  Oral    SpO2:  99%  94%  Weight: 72.6 kg (160 lb)     Height: 5\' 9"  (1.753 m)       General: Pt is alert, awake, not in acute distress Cardiovascular: RRR, S1/S2 +, no rubs, no gallops Respiratory: CTA bilaterally, no wheezing, no rhonchi Abdominal: Soft, NT, ND, bowel sounds + Extremities: no edema, no cyanosis    The results of significant diagnostics from this hospitalization (including imaging, microbiology, ancillary and laboratory) are listed below for reference.     Microbiology: No  results found for this or any previous visit (from the past 240 hour(s)).   Labs: BNP (last 3 results) No results for input(s): BNP in the last 8760 hours. Basic Metabolic Panel:  Recent Labs Lab 04/21/16 1859 04/22/16 0417  NA 137 138  K 4.1 3.9  CL 106 108  CO2 22 25  GLUCOSE 123* 93  BUN 23* 21*  CREATININE 1.50* 1.48*  CALCIUM 8.5* 8.1*   Liver Function Tests: No results for input(s): AST, ALT, ALKPHOS, BILITOT, PROT, ALBUMIN in the last 168 hours. No results for input(s): LIPASE, AMYLASE in the last 168 hours. No results for input(s): AMMONIA in the last 168 hours. CBC:  Recent Labs Lab 04/21/16 1859  WBC 6.6  NEUTROABS 4.5  HGB 11.9*  HCT 34.7*  MCV 91.3  PLT 140*   Cardiac Enzymes: No results for input(s): CKTOTAL, CKMB, CKMBINDEX, TROPONINI in the last 168 hours. BNP: Invalid input(s): POCBNP CBG:  Recent Labs Lab 04/22/16 0750  GLUCAP 90   D-Dimer No results for input(s): DDIMER in the last 72 hours. Hgb A1c No results for input(s): HGBA1C in the last 72 hours. Lipid Profile No results for input(s): CHOL, HDL, LDLCALC, TRIG, CHOLHDL, LDLDIRECT in the last 72 hours. Thyroid function studies No results for input(s): TSH, T4TOTAL, T3FREE, THYROIDAB in the last 72 hours.  Invalid input(s): FREET3 Anemia work up No results for input(s): VITAMINB12, FOLATE, FERRITIN, TIBC, IRON, RETICCTPCT in the last 72 hours. Urinalysis No results found for: COLORURINE, APPEARANCEUR, LABSPEC, [place], GLUCOSEU, HGBUR, BILIRUBINUR, KETONESUR, PROTEINUR, UROBILINOGEN, NITRITE, LEUKOCYTESUR Sepsis Labs Invalid input(s): PROCALCITONIN,  WBC,  LACTICIDVEN Microbiology No results found for this or any previous visit (from the past 240 hour(s)).    SIGNED:   Cordelia Poche, MD [facility] 04/22/2016, 1:50 PM Pager [phone]  If 7PM-7AM, please contact night-coverage [email or web address] Password [email or web address]

## 2016-04-22 NOTE — NC FL2 (Signed)
[place] LEVEL OF CARE SCREENING TOOL     IDENTIFICATION  Patient Name: Stanley Hunter Birthdate: [birth year] Sex: male Admission Date (Current Location): 04/21/2016  [facility] and [place] Number:  Herbalist and Address:  [facility],  [place], [place]      Provider Number: [id number]  Attending Physician Name and Address:  Mariel Aloe, MD  Relative Name and Phone Number:       Current Level of Care: Hospital Recommended Level of Care: [place] Prior Approval Number:    Date Approved/Denied:   PASRR Number: [id number]  Discharge Plan: SNF    Current Diagnoses: Patient Active Problem List   Diagnosis Date Noted  . Acute respiratory failure with hypoxia ([place]) 04/21/2016  . Influenza B 04/21/2016  . Edema 02/01/2016  . Pain 11/24/2015  . Trigger ring finger of left hand 11/16/2015  . Tremor 08/07/2015  . Loss of balance 08/07/2015  . Macular degeneration 08/07/2015  . Urinary frequency 08/07/2015  . Cognitive changes 08/07/2015  . History of pacemaker   . Anxiety 08/03/2015  . Constipation 08/03/2015  . Acid reflux 08/03/2015  . H/O atrial flutter 08/03/2015  . Myalgia 08/03/2015  . Benign prostatic hyperplasia with urinary obstruction 12/07/2013  . Sick sinus syndrome ([place]) 11/18/2013  . Lumbar canal stenosis 07/07/2013  . Degenerative arthritis of hip 05/14/2013  . Degenerative arthritis of spine 05/14/2013  . Lumbar scoliosis 05/14/2013  . Avitaminosis D 05/18/2012  . Chronic kidney disease (CKD), stage III (moderate) 11/04/2011  . Benign essential HTN 02/09/2009  . Premature contractions, supraventricular 12/15/2008  . Pure hypercholesterolemia 10/08/2006    Orientation RESPIRATION BLADDER Height & Weight     Self, Time, Situation, Place  Normal Continent Weight: 160 lb (72.6 kg) Height:  5\' 9"  (175.3 cm)  BEHAVIORAL SYMPTOMS/MOOD NEUROLOGICAL BOWEL NUTRITION STATUS  Other  (Comment) (no behaviors)   Continent Diet  AMBULATORY STATUS COMMUNICATION OF NEEDS Skin   Limited Assist Verbally Normal                       Personal Care Assistance Level of Assistance  Bathing, Feeding, Dressing Bathing Assistance: Limited assistance Feeding assistance: Independent Dressing Assistance: Limited assistance     Functional Limitations Info  Sight, Hearing, Speech Sight Info: Adequate Hearing Info: Adequate Speech Info: Adequate    SPECIAL CARE FACTORS FREQUENCY  PT (By licensed PT), OT (By licensed OT)     PT Frequency: 5x wk OT Frequency: 5x wk            Contractures Contractures Info: Not present    Additional Factors Info  Code Status, Isolation Precautions Code Status Info: Full Code       Isolation Precautions Info: Droplet precaution     Current Medications (04/22/2016):  This is the current hospital active medication list Current Facility-Administered Medications  Medication Dose Route Frequency Provider Last Rate Last Dose  . acetaminophen (TYLENOL) tablet 650 mg  650 mg Oral Q6H PRN Vianne Bulls, MD       Or  . acetaminophen (TYLENOL) suppository 650 mg  650 mg Rectal Q6H PRN Vianne Bulls, MD      . ALPRAZolam Duanne Moron) tablet 0.5 mg  0.5 mg Oral TID PRN Vianne Bulls, MD      . amLODipine (NORVASC) tablet 10 mg  10 mg Oral Daily Vianne Bulls, MD   10 mg at 04/22/16 [id number]  . aspirin EC  tablet 81 mg  81 mg Oral Daily Vianne Bulls, MD   81 mg at 04/22/16 0836  . atorvastatin (LIPITOR) tablet 40 mg  40 mg Oral Daily Ilene Qua Opyd, MD      . fluticasone (FLONASE) 50 MCG/ACT nasal spray 2 spray  2 spray Each Nare Daily Vianne Bulls, MD   2 spray at 04/22/16 [phone]  . guaiFENesin-dextromethorphan (ROBITUSSIN DM) 100-10 MG/5ML syrup 10 mL  10 mL Oral Q4H PRN Mariel Aloe, MD      . heparin injection 5,000 Units  5,000 Units Subcutaneous Q8H Vianne Bulls, MD   5,000 Units at 04/22/16 1413  . HYDROcodone-acetaminophen  (NORCO/VICODIN) 5-325 MG per tablet 1-2 tablet  1-2 tablet Oral Q4H PRN Ilene Qua Opyd, MD      . ipratropium-albuterol (DUONEB) 0.5-2.5 (3) MG/3ML nebulizer solution 3 mL  3 mL Nebulization Q4H PRN Ilene Qua Opyd, MD      . loratadine (CLARITIN) tablet 10 mg  10 mg Oral Daily Timothy S Opyd, MD      . magnesium hydroxide (MILK OF MAGNESIA) suspension 30 mL  30 mL Oral PRN Vianne Bulls, MD      . multivitamin (PROSIGHT) tablet 1 tablet  1 tablet Oral BID Vianne Bulls, MD   1 tablet at 04/22/16 [phone]  . ondansetron (ZOFRAN) tablet 4 mg  4 mg Oral Q6H PRN Vianne Bulls, MD       Or  . ondansetron (ZOFRAN) injection 4 mg  4 mg Intravenous Q6H PRN Vianne Bulls, MD      . oseltamivir (TAMIFLU) capsule 30 mg  30 mg Oral BID Vianne Bulls, MD   30 mg at 04/22/16 0836  . oxymetazoline (AFRIN) 0.05 % nasal spray 2 spray  2 spray Each Nare BID PRN Vianne Bulls, MD      . pantoprazole (PROTONIX) EC tablet 40 mg  40 mg Oral Daily Vianne Bulls, MD   40 mg at 04/22/16 0836  . phenol (CHLORASEPTIC) mouth spray 1 spray  1 spray Mouth/Throat PRN Mariel Aloe, MD      . polyethylene glycol (MIRALAX / GLYCOLAX) packet 17 g  17 g Oral Daily PRN Vianne Bulls, MD      . propafenone (RYTHMOL) tablet 225 mg  225 mg Oral TID WC & HS Vianne Bulls, MD   225 mg at 04/22/16 1250  . tamsulosin (FLOMAX) capsule 0.4 mg  0.4 mg Oral QHS Vianne Bulls, MD   0.4 mg at 04/22/16 0105     Discharge Medications: Please see discharge summary for a list of discharge medications.  Relevant Imaging Results:  Relevant Lab Results:   Additional Information SS # [id number]  Eber Ferrufino, Randall An, LCSW

## 2016-04-22 NOTE — Progress Notes (Signed)
Pt / spouse are in agreement with d/c today to [facility] SNF. Pt is disappointed he is unable to return to independent apt but does understand need for rehab.Pt / spouse are aware this will be a pvt pay placement due to pt's observation / medicare status. PTAR transport is needed. Medical necessity form completed. Pt / spouse are aware out of pocket costs may be associated with PTAR transport. D/C Summary sent to SNF for review. Scripts included in d/c packet. # for report provided to nsg.  Werner Lean LCSW [phone]

## 2016-04-23 DIAGNOSIS — R2681 Unsteadiness on feet: Secondary | ICD-10-CM | POA: Diagnosis not present

## 2016-04-23 DIAGNOSIS — R1313 Dysphagia, pharyngeal phase: Secondary | ICD-10-CM | POA: Diagnosis not present

## 2016-04-23 DIAGNOSIS — Z9181 History of falling: Secondary | ICD-10-CM | POA: Diagnosis not present

## 2016-04-23 DIAGNOSIS — M6281 Muscle weakness (generalized): Secondary | ICD-10-CM | POA: Diagnosis not present

## 2016-04-24 ENCOUNTER — Telehealth: Payer: Self-pay

## 2016-04-24 DIAGNOSIS — M6281 Muscle weakness (generalized): Secondary | ICD-10-CM | POA: Diagnosis not present

## 2016-04-24 DIAGNOSIS — R1313 Dysphagia, pharyngeal phase: Secondary | ICD-10-CM | POA: Diagnosis not present

## 2016-04-24 DIAGNOSIS — Z9181 History of falling: Secondary | ICD-10-CM | POA: Diagnosis not present

## 2016-04-24 DIAGNOSIS — R2681 Unsteadiness on feet: Secondary | ICD-10-CM | POA: Diagnosis not present

## 2016-04-24 NOTE — Telephone Encounter (Signed)
Possible re-admission to facility. This is a patient you were seeing at [facility] . [facility] F/U is needed if patient was re-admitted to facility upon discharge. Hospital discharge from [place] on 04/22/16.

## 2016-04-27 LAB — CULTURE, BLOOD (ROUTINE X 2)
CULTURE: NO GROWTH
Culture: NO GROWTH

## 2016-04-29 ENCOUNTER — Non-Acute Institutional Stay (SKILLED_NURSING_FACILITY): Payer: Medicare Other | Admitting: Internal Medicine

## 2016-04-29 ENCOUNTER — Encounter: Payer: Self-pay | Admitting: Internal Medicine

## 2016-04-29 DIAGNOSIS — R4189 Other symptoms and signs involving cognitive functions and awareness: Secondary | ICD-10-CM | POA: Diagnosis not present

## 2016-04-29 DIAGNOSIS — N183 Chronic kidney disease, stage 3 unspecified: Secondary | ICD-10-CM

## 2016-04-29 DIAGNOSIS — J101 Influenza due to other identified influenza virus with other respiratory manifestations: Secondary | ICD-10-CM | POA: Diagnosis not present

## 2016-04-29 DIAGNOSIS — Z950 Presence of cardiac pacemaker: Secondary | ICD-10-CM

## 2016-04-29 DIAGNOSIS — J9601 Acute respiratory failure with hypoxia: Secondary | ICD-10-CM | POA: Diagnosis not present

## 2016-04-29 DIAGNOSIS — I10 Essential (primary) hypertension: Secondary | ICD-10-CM

## 2016-04-29 NOTE — Progress Notes (Signed)
History and Physical     Location:  [facility] manager of Service:  SNF (31)  PCP: Jeanmarie Hubert, MD Patient Care Team: Estill Dooms, MD as PCP - General (Internal Medicine)  Extended Emergency Contact Information Primary Emergency Contact: Detlefsen,[name] Address: [address]          [place], [place] [address] Johnnette Litter of [place] Phone: [phone] Relation: Spouse  Code Status: DNR Goals of Care: Advanced Directive information Advanced Directives 04/29/2016  Does Patient Have a Medical Advance Directive? Yes  Type of Paramedic of [place];Living will;Out of facility DNR (pink MOST or yellow form)  Does patient want to make changes to medical advance directive? -  Copy of [place] in Chart? Yes  Pre-existing out of facility DNR order (yellow form or pink MOST form) Yellow form placed in chart (order not valid for inpatient use)      Chief Complaint  Patient presents with  . New Admit To SNF    following hospitalization 04/21/16 to 04/22/16, acute respiratory failure with hypoxia. Influenza    HPI: Patient is a 83 y.o. male seen today for admission on 04/22/16  To [facility] SNF following hospitalization from 04/21/16 to 04/22/16. He had hypoxemia and acute respiratory illness secondary to Influenza B. He was able to be weaned to room air during his brief hospitalization. He is admitted to the SNF for rehab stay for strengthening and safe mobility before return to his apt. With his wife.  Other chronic issues include:  HTN, CKD3 BPH, Pacemaker, unstable gait with balance issues, HLD, Cognitive changes, OA hips  Past Medical History:  Diagnosis Date  . Acid reflux 08/03/2015  . Anxiety 08/03/2015  . Avitaminosis D 05/18/2012  . Benign essential HTN 02/09/2009   Overview:  Benign Essential Hypertension   . Benign prostatic hyperplasia with urinary obstruction 12/07/2013  . Chronic kidney disease (CKD), stage III  (moderate) 11/04/2011  . CN (constipation) 08/03/2015  . Cognitive changes 08/07/2015  . Degenerative arthritis of hip 05/14/2013  . Degenerative arthritis of spine 05/14/2013  . Degenerative disorder of muscle 08/03/2015  . Dysrhythmia 2001   ablation for a-flutter  . H/O atrial flutter 08/03/2015   Overview:  Pacemaker placed 2015   . High blood pressure   . High cholesterol   . History of pacemaker 2016  . Hx of cardiac cath 2013  . Loss of balance 08/07/2015  . Lumbar canal stenosis 07/07/2013   Overview:  Lumbar laminectomy 07/07/13 - Dr. Lurene Shadow   . Lumbar scoliosis 05/14/2013  . Macular degeneration 08/07/2015  . Premature contractions, supraventricular 12/15/2008   Overview:  Atrial Premature Complex   . Presence of permanent cardiac pacemaker   . Tremor 08/07/2015  . Urinary frequency 08/07/2015   Past Surgical History:  Procedure Laterality Date  . ATRIAL FLUTTER ABLATION  2001  . CATARACT EXTRACTION Bilateral 2015  . CHOLECYSTECTOMY    . MASTOIDECTOMY  1934   lower back   . PACEMAKER PLACEMENT  2016  . SPINE SURGERY  2016  . TONSILLECTOMY  1934  . TRIGGER FINGER RELEASE Left 12/14/2015   Procedure: RELEASE TRIGGER FINGER/A-1 PULLEY ring finger left;  Surgeon: Daryll Brod, MD;  Location: [place];  Service: Orthopedics;  Laterality: Left;  FAB    reports that he quit smoking about 40 years ago. His smoking use included Cigarettes. He quit after 50.00 years of use. He has never used smokeless tobacco. He  reports that he drinks about 1.8 oz of alcohol per week . He reports that he does not use drugs. Social History   Social History  . Marital status: Married    Spouse name: N/A  . Number of children: N/A  . Years of education: N/A   Occupational History  . Not on file.   Social History Main Topics  . Smoking status: Former Smoker    Years: 50.00    Types: Cigarettes    Quit date: 08/03/1975  . Smokeless tobacco: Never Used     Comment: smoked 6 cig dialy     . Alcohol use 1.8 oz/week    3 Standard drinks or equivalent per week     Comment: 3 times a week  . Drug use: No  . Sexual activity: Not on file   Other Topics Concern  . Not on file   Social History Narrative   Lives at [facility]  Since 4 /16/2016 with wife [name]   Diet: n/a   Caffeine: Yes   Married: yes, 1957   House: Yes, 2 persons   Pets: no pets    Current/Past profession: Optometrist    Exercise: No   Living Will: Yes   DNR: Yes   POA/HPOA: Yes   Walks with walker       Functional Status Survey:    History reviewed. No pertinent family history.  Health Maintenance  Topic Date Due  . PNA vac Low Risk Adult (1 of 2 - PCV13) 07/31/1991  . TETANUS/TDAP  02/25/2006  . INFLUENZA VACCINE  09/26/2015    Allergies  Allergen Reactions  . Cortisone Other (See Comments)  . Neomycin Rash  . Sulfamethoxazole-Trimethoprim Other (See Comments)    Leg weakness    Allergies as of 04/29/2016      Reactions   Cortisone Other (See Comments)   Neomycin Rash   Sulfamethoxazole-trimethoprim Other (See Comments)   Leg weakness      Medication List       Accurate as of 04/29/16  4:36 PM. Always use your most recent med list.          acetaminophen 325 MG tablet Commonly known as:  TYLENOL Take 325 mg by mouth. Take 2 tablts every 4 hours as needed for minor pain/headaches for 48 hours   ALPRAZolam 0.5 MG tablet Commonly known as:  XANAX Take 0.5 mg by mouth. Take one tablet every 8 hours as needed for anxiety   amLODipine 10 MG tablet Commonly known as:  NORVASC Take 1 tablet (10 mg total) by mouth daily.   aspirin EC 81 MG tablet Take 81 mg by mouth daily.   atorvastatin 40 MG tablet Commonly known as:  LIPITOR Take 40 mg by mouth daily.   fluticasone 50 MCG/ACT nasal spray Commonly known as:  FLONASE Place 2 sprays into both nostrils daily.   guaiFENesin-dextromethorphan 100-10 MG/5ML syrup Commonly known as:  ROBITUSSIN DM Take 10 mLs by  mouth every 4 (four) hours as needed for cough.   loratadine 10 MG tablet Commonly known as:  CLARITIN Take 10 mg by mouth daily.   magnesium hydroxide 800 MG/5ML suspension Commonly known as:  MILK OF MAGNESIA Take 30 mLs by mouth as needed for constipation.   ondansetron 4 MG tablet Commonly known as:  ZOFRAN Take 1 tablet (4 mg total) by mouth every 6 (six) hours as needed for nausea.   pantoprazole 40 MG tablet Commonly known as:  PROTONIX Take 40 mg by  mouth daily.   PRESERVISION AREDS Caps Take 1 capsule by mouth 2 (two) times daily. 2 by mouth daily   propafenone 150 MG tablet Commonly known as:  RYTHMOL Take 225 mg by mouth 4 (four) times daily. 1.5 tablets 4 times daily breakfast, lunch, dinner and at bedtime   tamsulosin 0.4 MG Caps capsule Commonly known as:  FLOMAX Take 0.4 mg by mouth at bedtime.   VITAMIN D (ERGOCALCIFEROL) PO Take 1 tablet by mouth daily. 2,000 units once daily       Review of Systems  Constitutional: Negative for activity change, appetite change, fatigue, fever and unexpected weight change.  HENT: Positive for sinus pressure. Negative for congestion, ear pain, hearing loss, rhinorrhea, sore throat, tinnitus, trouble swallowing and voice change.   Eyes: Positive for visual disturbance (macular degeneration).       Corrective lenses  Respiratory: Positive for chest tightness. Negative for cough, choking, shortness of breath and wheezing.   Cardiovascular: Positive for palpitations and leg swelling (both lower legs). Negative for chest pain.       Pacemakeer  Gastrointestinal: Negative for abdominal distention, abdominal pain, constipation, diarrhea and nausea.  Endocrine: Negative for cold intolerance, heat intolerance, polydipsia, polyphagia and polyuria.  Genitourinary: Positive for frequency. Negative for dysuria, testicular pain and urgency.       Not incontinent. BPH.  Musculoskeletal: Positive for arthralgias, back pain and  myalgias. Negative for gait problem and neck pain.  Skin: Negative for color change, pallor and rash.  Allergic/Immunologic: Negative.   Neurological: Negative for dizziness, tremors, syncope, speech difficulty, weakness, numbness and headaches.       Memory loss  Hematological: Negative for adenopathy. Bruises/bleeds easily.  Psychiatric/Behavioral: Positive for confusion. Negative for behavioral problems, decreased concentration, hallucinations and sleep disturbance. The patient is not nervous/anxious.     Vitals:   04/29/16 1609  BP: 120/60  Pulse: 68  Resp: 20  Temp: 98.3 F (36.8 C)  Weight: 157 lb (71.2 kg)  Height: 5\' 9"  (1.753 m)   Body mass index is 23.18 kg/m. Physical Exam  Constitutional: He is oriented to person, place, and time. He appears well-developed and well-nourished. No distress.  HENT:  Head: Normocephalic and atraumatic.  Right Ear: External ear normal.  Left Ear: External ear normal.  Nose: Nose normal.  Mouth/Throat: Oropharynx is clear and moist. No oropharyngeal exudate.  Thick white mucus draining; bilateral cerumen impaction   Eyes: Conjunctivae and EOM are normal. Pupils are equal, round, and reactive to light.  glasses  Neck: Normal range of motion. No JVD present. No tracheal deviation present. No thyromegaly present.  Cardiovascular: Normal rate, regular rhythm, normal heart sounds and intact distal pulses.  Exam reveals no gallop and no friction rub.   No murmur heard. Pacemaker left upper chest.  Pulmonary/Chest: Effort normal and breath sounds normal. No respiratory distress. He has no wheezes. He has no rales. He exhibits no tenderness.  No rhonchi  Abdominal: Soft. Bowel sounds are normal. He exhibits no distension and no mass. There is no tenderness.  Genitourinary:  Genitourinary Comments: BPH pon prior exam  Musculoskeletal: Normal range of motion. He exhibits no edema or tenderness.  Unsteady gait. Using a walker.  Lymphadenopathy:      He has no cervical adenopathy.  Neurological: He is alert and oriented to person, place, and time. He has normal reflexes. No cranial nerve deficit. Coordination normal.  mild cognitive changes.  Skin: No rash noted. No erythema. No pallor.  Psychiatric: He has  a normal mood and affect. His behavior is normal. Judgment and thought content normal.    Labs reviewed: Basic Metabolic Panel:  Recent Labs  01/22/16 0001 01/23/16 04/21/16 1859 04/22/16 0417  NA 141 141 137 138  K 4.3 4.3 4.1 3.9  CL 106  --  106 108  CO2 27  --  22 25  GLUCOSE 92  --  123* 93  BUN 29* 29* 23* 21*  CREATININE 1.49* 1.5* 1.50* 1.48*  CALCIUM 8.7  --  8.5* 8.1*   Liver Function Tests:  Recent Labs  01/22/16 0001 01/23/16  AST 21 21  ALT 17 17  ALKPHOS 69 69  BILITOT 0.7  --   PROT 6.9  --   ALBUMIN 3.9  --    No results for input(s): LIPASE, AMYLASE in the last 8760 hours. No results for input(s): AMMONIA in the last 8760 hours. CBC:  Recent Labs  04/21/16 1859  WBC 6.6  NEUTROABS 4.5  HGB 11.9*  HCT 34.7*  MCV 91.3  PLT 140*   Cardiac Enzymes: No results for input(s): CKTOTAL, CKMB, CKMBINDEX, TROPONINI in the last 8760 hours. BNP: Invalid input(s): POCBNP No results found for: HGBA1C No results found for: TSH No results found for: VITAMINB12 No results found for: FOLATE No results found for: IRON, TIBC, FERRITIN  Imaging and Procedures obtained prior to SNF admission: Dg Chest 2 View  Result Date: 04/21/2016 CLINICAL DATA:  Persistent cough for 2 months.  Hypoxia. EXAM: CHEST  2 VIEW COMPARISON:  None. FINDINGS: Borderline mild cardiomegaly. Tortuous thoracic aorta with atherosclerosis. No pulmonary edema. Dual lead left-sided pacemaker with leads overlying the right atrium and ventricle. Streaky bibasilar opacities, right greater than left, likely atelectasis or scarring. No confluent airspace disease. No definite pleural fluid. No pneumothorax. Age related degenerative  change in the thoracic spine. IMPRESSION: 1. Borderline cardiomegaly. Thoracic aortic atherosclerosis. No evidence of congestive failure. 2. Streaky bibasilar opacities, right greater than left, likely atelectasis or scarring. Electronically Signed   By: Jeb Levering M.D.   On: 04/21/2016 19:43    Assessment/Plan 1. Influenza B Finished Tamiflu. Feeling much stronger.  2. Acute respiratory failure with hypoxia (HCC) Resolved. Maintaining appropriate O2 sat on room air.  3. Benign essential HTN controlled  4. Chronic kidney disease (CKD), stage III (moderate) Recheck ;lab  5. Cognitive changes Continue to monitor  6. History of pacemaker Functioning appropriately.  I anticipate this will be a short term admission for stabilization and strengthening prior to return to his apt. With his wife.

## 2016-05-02 ENCOUNTER — Encounter: Payer: Medicare Other | Admitting: Nurse Practitioner

## 2016-05-02 ENCOUNTER — Encounter: Payer: Self-pay | Admitting: Internal Medicine

## 2016-05-02 ENCOUNTER — Non-Acute Institutional Stay (SKILLED_NURSING_FACILITY): Payer: Medicare Other | Admitting: Internal Medicine

## 2016-05-02 DIAGNOSIS — J9601 Acute respiratory failure with hypoxia: Secondary | ICD-10-CM

## 2016-05-02 DIAGNOSIS — R609 Edema, unspecified: Secondary | ICD-10-CM | POA: Diagnosis not present

## 2016-05-02 DIAGNOSIS — I10 Essential (primary) hypertension: Secondary | ICD-10-CM

## 2016-05-02 DIAGNOSIS — R2689 Other abnormalities of gait and mobility: Secondary | ICD-10-CM | POA: Diagnosis not present

## 2016-05-02 NOTE — Progress Notes (Signed)
Discharge from SNF Note     Location:  [facility] Room Number: N64 Place of Service:  SNF [phone])  Provider:PCP: Jeanmarie Hubert, MD Patient Care Team: Estill Dooms, MD as PCP - General (Internal Medicine)  Extended Emergency Contact Information Primary Emergency Contact: Corrigan,[name] Address: [address]          [place], [place] [address] Johnnette Litter of [place] Phone: [phone] Relation: Spouse  Code Status: DNR Goals of care:  Advanced Directive information Advanced Directives 05/02/2016  Does Patient Have a Medical Advance Directive? Yes  Type of Paramedic of [place];Living will;Out of facility DNR (pink MOST or yellow form)  Does patient want to make changes to medical advance directive? -  Copy of [place] in Chart? Yes  Pre-existing out of facility DNR order (yellow form or pink MOST form) Yellow form placed in chart (order not valid for inpatient use)     Allergies  Allergen Reactions  . Cortisone Other (See Comments)  . Neomycin Rash  . Sulfamethoxazole-Trimethoprim Other (See Comments)    Leg weakness    Chief Complaint  Patient presents with  . Discharge Note    to be discharge back to his apartment   [place] to SNF 04/22/16 DC from [facility] 05/03/16  Follow up appointment at [facility] clinic on 05/09/16 at 1:15 PM   HPI:  83 y.o. male  Admitted to [facility] SNF on 2/26/8 following hospitalization from 04/21/16 to 04/22/16. He had hypoxemia and acute respiratory illness secondary to Influenza B. He was able to be weaned to room air during his brief hospitalization. He was admitted to the SNF for rehab stay for strengthening and safe mobility before return to his apt. with his wife.  In the last few days, he has improved in mobility. He is able to walk all around the facility using his walker. His breathing has improved. He still feels weak, but says he will be able to do more at home than in the  SNF.    Past Medical History:  Diagnosis Date  . Acid reflux 08/03/2015  . Anxiety 08/03/2015  . Avitaminosis D 05/18/2012  . Benign essential HTN 02/09/2009   Overview:  Benign Essential Hypertension   . Benign prostatic hyperplasia with urinary obstruction 12/07/2013  . Chronic kidney disease (CKD), stage III (moderate) 11/04/2011  . CN (constipation) 08/03/2015  . Cognitive changes 08/07/2015  . Degenerative arthritis of hip 05/14/2013  . Degenerative arthritis of spine 05/14/2013  . Degenerative disorder of muscle 08/03/2015  . Dysrhythmia 2001   ablation for a-flutter  . H/O atrial flutter 08/03/2015   Overview:  Pacemaker placed 2015   . High blood pressure   . High cholesterol   . History of pacemaker 2016  . Hx of cardiac cath 2013  . Loss of balance 08/07/2015  . Lumbar canal stenosis 07/07/2013   Overview:  Lumbar laminectomy 07/07/13 - Dr. Lurene Shadow   . Lumbar scoliosis 05/14/2013  . Macular degeneration 08/07/2015  . Premature contractions, supraventricular 12/15/2008   Overview:  Atrial Premature Complex   . Presence of permanent cardiac pacemaker   . Tremor 08/07/2015  . Urinary frequency 08/07/2015    Past Surgical History:  Procedure Laterality Date  . ATRIAL FLUTTER ABLATION  2001  . CATARACT EXTRACTION Bilateral 2015  . CHOLECYSTECTOMY    . MASTOIDECTOMY  1934   lower back   . PACEMAKER PLACEMENT  2016  . SPINE SURGERY  2016  .  TONSILLECTOMY  1934  . TRIGGER FINGER RELEASE Left 12/14/2015   Procedure: RELEASE TRIGGER FINGER/A-1 PULLEY ring finger left;  Surgeon: Daryll Brod, MD;  Location: [facility];  Service: Orthopedics;  Laterality: Left;  FAB      reports that he quit smoking about 40 years ago. His smoking use included Cigarettes. He quit after 50.00 years of use. He has never used smokeless tobacco. He reports that he drinks about 1.8 oz of alcohol per week . He reports that he does not use drugs. Social History   Social History  . Marital status:  Married    Spouse name: N/A  . Number of children: N/A  . Years of education: N/A   Occupational History  . Not on file.   Social History Main Topics  . Smoking status: Former Smoker    Years: 50.00    Types: Cigarettes    Quit date: 08/03/1975  . Smokeless tobacco: Never Used     Comment: smoked 6 cig dialy   . Alcohol use 1.8 oz/week    3 Standard drinks or equivalent per week     Comment: 3 times a week  . Drug use: No  . Sexual activity: Not on file   Other Topics Concern  . Not on file   Social History Narrative   Lives at [facility]  Since 4 /16/2016 with wife [name]   Diet: n/a   Caffeine: Yes   Married: yes, 1957   House: Yes, 2 persons   Pets: no pets    Current/Past profession: Optometrist    Exercise: No   Living Will: Yes   DNR: Yes   POA/HPOA: Yes   Walks with walker      Functional Status Survey:    Allergies  Allergen Reactions  . Cortisone Other (See Comments)  . Neomycin Rash  . Sulfamethoxazole-Trimethoprim Other (See Comments)    Leg weakness    Pertinent  Health Maintenance Due  Topic Date Due  . PNA vac Low Risk Adult (1 of 2 - PCV13) 07/31/1991  . INFLUENZA VACCINE  09/26/2015    Medications: Allergies as of 05/02/2016      Reactions   Cortisone Other (See Comments)   Neomycin Rash   Sulfamethoxazole-trimethoprim Other (See Comments)   Leg weakness      Medication List       Accurate as of 05/02/16  4:46 PM. Always use your most recent med list.          acetaminophen 325 MG tablet Commonly known as:  TYLENOL Take 325 mg by mouth. Take 2 tablts every 4 hours as needed for minor pain/headaches for 48 hours   ALPRAZolam 0.5 MG tablet Commonly known as:  XANAX Take 0.5 mg by mouth. Take one tablet every 8 hours as needed for anxiety   amLODipine 10 MG tablet Commonly known as:  NORVASC Take 1 tablet (10 mg total) by mouth daily.   aspirin EC 81 MG tablet Take 81 mg by mouth daily.   atorvastatin 40 MG  tablet Commonly known as:  LIPITOR Take 40 mg by mouth daily.   fluticasone 50 MCG/ACT nasal spray Commonly known as:  FLONASE Place 2 sprays into both nostrils daily.   guaiFENesin-dextromethorphan 100-10 MG/5ML syrup Commonly known as:  ROBITUSSIN DM Take 10 mLs by mouth every 4 (four) hours as needed for cough.   loratadine 10 MG tablet Commonly known as:  CLARITIN Take 10 mg by mouth daily.   magnesium  hydroxide 800 MG/5ML suspension Commonly known as:  MILK OF MAGNESIA Take 30 mLs by mouth as needed for constipation.   ondansetron 4 MG tablet Commonly known as:  ZOFRAN Take 1 tablet (4 mg total) by mouth every 6 (six) hours as needed for nausea.   pantoprazole 40 MG tablet Commonly known as:  PROTONIX Take 40 mg by mouth daily.   PRESERVISION AREDS Caps Take 1 capsule by mouth 2 (two) times daily. 2 by mouth daily   propafenone 150 MG tablet Commonly known as:  RYTHMOL Take 225 mg by mouth 4 (four) times daily. 1.5 tablets 4 times daily breakfast, lunch, dinner and at bedtime   tamsulosin 0.4 MG Caps capsule Commonly known as:  FLOMAX Take 0.4 mg by mouth at bedtime.   VITAMIN D (ERGOCALCIFEROL) PO Take 1 tablet by mouth daily. 2,000 units once daily       Review of Systems  Constitutional: Negative for activity change, appetite change, fatigue, fever and unexpected weight change.  HENT: Positive for sinus pressure. Negative for congestion, ear pain, hearing loss, rhinorrhea, sore throat, tinnitus, trouble swallowing and voice change.   Eyes: Positive for visual disturbance (macular degeneration).       Corrective lenses  Respiratory: Positive for chest tightness. Negative for cough, choking, shortness of breath and wheezing.   Cardiovascular: Positive for palpitations and leg swelling (both lower legs). Negative for chest pain.       Pacemakeer  Gastrointestinal: Negative for abdominal distention, abdominal pain, constipation, diarrhea and nausea.   Endocrine: Negative for cold intolerance, heat intolerance, polydipsia, polyphagia and polyuria.  Genitourinary: Positive for frequency. Negative for dysuria, testicular pain and urgency.       Not incontinent. BPH.  Musculoskeletal: Positive for arthralgias, back pain and myalgias. Negative for gait problem and neck pain.  Skin: Negative for color change, pallor and rash.  Allergic/Immunologic: Negative.   Neurological: Negative for dizziness, tremors, syncope, speech difficulty, weakness, numbness and headaches.       Memory loss  Hematological: Negative for adenopathy. Bruises/bleeds easily.  Psychiatric/Behavioral: Positive for confusion. Negative for behavioral problems, decreased concentration, hallucinations and sleep disturbance. The patient is not nervous/anxious.     Vitals:   05/02/16 1640  BP: 116/60  Pulse: (!) 56  Resp: 20  Temp: 98.3 F (36.8 C)  Weight: 160 lb (72.6 kg)  Height: 5\' 9"  (1.753 m)   Body mass index is 23.63 kg/m. Physical Exam  Constitutional: He is oriented to person, place, and time. He appears well-developed and well-nourished. No distress.  HENT:  Head: Normocephalic and atraumatic.  Right Ear: External ear normal.  Left Ear: External ear normal.  Nose: Nose normal.  Mouth/Throat: Oropharynx is clear and moist. No oropharyngeal exudate.  Thick white mucus draining; bilateral cerumen impaction   Eyes: Conjunctivae and EOM are normal. Pupils are equal, round, and reactive to light.  glasses  Neck: Normal range of motion. No JVD present. No tracheal deviation present. No thyromegaly present.  Cardiovascular: Normal rate, regular rhythm, normal heart sounds and intact distal pulses.  Exam reveals no gallop and no friction rub.   No murmur heard. Pacemaker left upper chest.  Pulmonary/Chest: Effort normal and breath sounds normal. No respiratory distress. He has no wheezes. He has no rales. He exhibits no tenderness.  No rhonchi  Abdominal:  Soft. Bowel sounds are normal. He exhibits no distension and no mass. There is no tenderness.  Genitourinary:  Genitourinary Comments: BPH pon prior exam  Musculoskeletal: Normal range of  motion. He exhibits no edema or tenderness.  Unsteady gait. Using a walker.  Lymphadenopathy:    He has no cervical adenopathy.  Neurological: He is alert and oriented to person, place, and time. He has normal reflexes. No cranial nerve deficit. Coordination normal.  mild cognitive changes.  Skin: No rash noted. No erythema. No pallor.  Psychiatric: He has a normal mood and affect. His behavior is normal. Judgment and thought content normal.    Labs reviewed: Basic Metabolic Panel:  Recent Labs  01/22/16 0001 01/23/16 04/21/16 1859 04/22/16 0417  NA 141 141 137 138  K 4.3 4.3 4.1 3.9  CL 106  --  106 108  CO2 27  --  22 25  GLUCOSE 92  --  123* 93  BUN 29* 29* 23* 21*  CREATININE 1.49* 1.5* 1.50* 1.48*  CALCIUM 8.7  --  8.5* 8.1*   Liver Function Tests:  Recent Labs  01/22/16 0001 01/23/16  AST 21 21  ALT 17 17  ALKPHOS 69 69  BILITOT 0.7  --   PROT 6.9  --   ALBUMIN 3.9  --    No results for input(s): LIPASE, AMYLASE in the last 8760 hours. No results for input(s): AMMONIA in the last 8760 hours. CBC:  Recent Labs  04/21/16 1859  WBC 6.6  NEUTROABS 4.5  HGB 11.9*  HCT 34.7*  MCV 91.3  PLT 140*   Cardiac Enzymes: No results for input(s): CKTOTAL, CKMB, CKMBINDEX, TROPONINI in the last 8760 hours. BNP: Invalid input(s): POCBNP CBG:  Recent Labs  04/22/16 0750  GLUCAP 90    Procedures and Imaging Studies During Stay: Dg Chest 2 View  Result Date: 04/21/2016 CLINICAL DATA:  Persistent cough for 2 months.  Hypoxia. EXAM: CHEST  2 VIEW COMPARISON:  None. FINDINGS: Borderline mild cardiomegaly. Tortuous thoracic aorta with atherosclerosis. No pulmonary edema. Dual lead left-sided pacemaker with leads overlying the right atrium and ventricle. Streaky bibasilar  opacities, right greater than left, likely atelectasis or scarring. No confluent airspace disease. No definite pleural fluid. No pneumothorax. Age related degenerative change in the thoracic spine. IMPRESSION: 1. Borderline cardiomegaly. Thoracic aortic atherosclerosis. No evidence of congestive failure. 2. Streaky bibasilar opacities, right greater than left, likely atelectasis or scarring. Electronically Signed   By: Jeb Levering M.D.   On: 04/21/2016 19:43    Assessment/Plan:    1. Acute respiratory failure with hypoxia (HCC) Resolved. Off O2.  2. Benign essential HTN controlled  3. Loss of balance Using 4 wheel walker with brakes and set.  4. Edema, unspecified type improved

## 2016-05-07 DIAGNOSIS — R1313 Dysphagia, pharyngeal phase: Secondary | ICD-10-CM | POA: Diagnosis not present

## 2016-05-07 DIAGNOSIS — I4891 Unspecified atrial fibrillation: Secondary | ICD-10-CM | POA: Diagnosis not present

## 2016-05-07 DIAGNOSIS — M6281 Muscle weakness (generalized): Secondary | ICD-10-CM | POA: Diagnosis not present

## 2016-05-07 DIAGNOSIS — Z9181 History of falling: Secondary | ICD-10-CM | POA: Diagnosis not present

## 2016-05-07 DIAGNOSIS — R41841 Cognitive communication deficit: Secondary | ICD-10-CM | POA: Diagnosis not present

## 2016-05-07 DIAGNOSIS — K219 Gastro-esophageal reflux disease without esophagitis: Secondary | ICD-10-CM | POA: Diagnosis not present

## 2016-05-07 DIAGNOSIS — R2681 Unsteadiness on feet: Secondary | ICD-10-CM | POA: Diagnosis not present

## 2016-05-08 DIAGNOSIS — R1313 Dysphagia, pharyngeal phase: Secondary | ICD-10-CM | POA: Diagnosis not present

## 2016-05-08 DIAGNOSIS — H353132 Nonexudative age-related macular degeneration, bilateral, intermediate dry stage: Secondary | ICD-10-CM | POA: Diagnosis not present

## 2016-05-08 DIAGNOSIS — M6281 Muscle weakness (generalized): Secondary | ICD-10-CM | POA: Diagnosis not present

## 2016-05-08 DIAGNOSIS — H35371 Puckering of macula, right eye: Secondary | ICD-10-CM | POA: Diagnosis not present

## 2016-05-08 DIAGNOSIS — R2681 Unsteadiness on feet: Secondary | ICD-10-CM | POA: Diagnosis not present

## 2016-05-08 DIAGNOSIS — H43313 Vitreous membranes and strands, bilateral: Secondary | ICD-10-CM | POA: Diagnosis not present

## 2016-05-08 DIAGNOSIS — K219 Gastro-esophageal reflux disease without esophagitis: Secondary | ICD-10-CM | POA: Diagnosis not present

## 2016-05-08 DIAGNOSIS — R41841 Cognitive communication deficit: Secondary | ICD-10-CM | POA: Diagnosis not present

## 2016-05-08 DIAGNOSIS — H04123 Dry eye syndrome of bilateral lacrimal glands: Secondary | ICD-10-CM | POA: Diagnosis not present

## 2016-05-08 DIAGNOSIS — Z9181 History of falling: Secondary | ICD-10-CM | POA: Diagnosis not present

## 2016-05-09 ENCOUNTER — Encounter: Payer: Self-pay | Admitting: Internal Medicine

## 2016-05-09 ENCOUNTER — Non-Acute Institutional Stay: Payer: Medicare Other | Admitting: Internal Medicine

## 2016-05-09 VITALS — BP 120/62 | HR 70 | Temp 97.8°F | Ht 69.0 in | Wt 167.0 lb

## 2016-05-09 DIAGNOSIS — D170 Benign lipomatous neoplasm of skin and subcutaneous tissue of head, face and neck: Secondary | ICD-10-CM | POA: Diagnosis not present

## 2016-05-09 DIAGNOSIS — N183 Chronic kidney disease, stage 3 unspecified: Secondary | ICD-10-CM

## 2016-05-09 DIAGNOSIS — R2689 Other abnormalities of gait and mobility: Secondary | ICD-10-CM

## 2016-05-09 DIAGNOSIS — R41841 Cognitive communication deficit: Secondary | ICD-10-CM | POA: Diagnosis not present

## 2016-05-09 DIAGNOSIS — D179 Benign lipomatous neoplasm, unspecified: Secondary | ICD-10-CM | POA: Insufficient documentation

## 2016-05-09 DIAGNOSIS — I10 Essential (primary) hypertension: Secondary | ICD-10-CM

## 2016-05-09 DIAGNOSIS — R609 Edema, unspecified: Secondary | ICD-10-CM

## 2016-05-09 DIAGNOSIS — M6281 Muscle weakness (generalized): Secondary | ICD-10-CM | POA: Diagnosis not present

## 2016-05-09 DIAGNOSIS — M48061 Spinal stenosis, lumbar region without neurogenic claudication: Secondary | ICD-10-CM | POA: Diagnosis not present

## 2016-05-09 DIAGNOSIS — R2681 Unsteadiness on feet: Secondary | ICD-10-CM | POA: Diagnosis not present

## 2016-05-09 DIAGNOSIS — K5900 Constipation, unspecified: Secondary | ICD-10-CM | POA: Diagnosis not present

## 2016-05-09 DIAGNOSIS — J101 Influenza due to other identified influenza virus with other respiratory manifestations: Secondary | ICD-10-CM

## 2016-05-09 DIAGNOSIS — Z9181 History of falling: Secondary | ICD-10-CM | POA: Diagnosis not present

## 2016-05-09 DIAGNOSIS — R1313 Dysphagia, pharyngeal phase: Secondary | ICD-10-CM | POA: Diagnosis not present

## 2016-05-09 DIAGNOSIS — K219 Gastro-esophageal reflux disease without esophagitis: Secondary | ICD-10-CM | POA: Diagnosis not present

## 2016-05-09 NOTE — Progress Notes (Signed)
Facility  [facility]    Place of Service: Clinic (12)     Allergies  Allergen Reactions  . Cortisone Other (See Comments)  . Neomycin Rash  . Sulfamethoxazole-Trimethoprim Other (See Comments)    Leg weakness    Chief Complaint  Patient presents with  . Medical Management of Chronic Issues    Recently dischaged from SNF to home    HPI:  Discharged from [facility] SNF following stay from 04/22/16 to 05/03/16. Hospitalized 2/25//18 to 04/22/16 for influenza B. Admitted to SNF for strengthening and safe mobility. Accomplished goals and was discharged to apt with his wife.  Chronic sinus drainage.   Has a small nodule in the left upper posterior neck. Has been there most of his life. Np pain.  Medications: Patient's Medications  New Prescriptions   No medications on file  Previous Medications   ACETAMINOPHEN (TYLENOL) 325 MG TABLET    Take 325 mg by mouth. Take 2 tablts every 4 hours as needed for minor pain/headaches for 48 hours   ALPRAZOLAM (XANAX) 0.5 MG TABLET    Take 0.5 mg by mouth. Take one tablet every 8 hours as needed for anxiety   AMLODIPINE (NORVASC) 10 MG TABLET    Take 1 tablet (10 mg total) by mouth daily.   ASPIRIN EC 81 MG TABLET    Take 81 mg by mouth daily.   ATORVASTATIN (LIPITOR) 40 MG TABLET    Take 40 mg by mouth daily.   FLUTICASONE (FLONASE) 50 MCG/ACT NASAL SPRAY    Place 2 sprays into both nostrils daily.   GUAIFENESIN-DEXTROMETHORPHAN (ROBITUSSIN DM) 100-10 MG/5ML SYRUP    Take 10 mLs by mouth every 4 (four) hours as needed for cough.   LORATADINE (CLARITIN) 10 MG TABLET    Take 10 mg by mouth daily.   MAGNESIUM HYDROXIDE (MILK OF MAGNESIA) 800 MG/5ML SUSPENSION    Take 30 mLs by mouth as needed for constipation.   MULTIPLE VITAMINS-MINERALS (PRESERVISION AREDS) CAPS    Take 1 capsule by mouth 2 (two) times daily. 2 by mouth daily    ONDANSETRON (ZOFRAN) 4 MG TABLET    Take 1 tablet (4 mg total) by mouth every 6 (six) hours as needed for nausea.   PANTOPRAZOLE  (PROTONIX) 40 MG TABLET    Take 40 mg by mouth daily.   PROPAFENONE (RYTHMOL) 150 MG TABLET    Take 225 mg by mouth 4 (four) times daily. 1.5 tablets 4 times daily breakfast, lunch, dinner and at bedtime    TAMSULOSIN (FLOMAX) 0.4 MG CAPS CAPSULE    Take 0.4 mg by mouth at bedtime.   VITAMIN D, ERGOCALCIFEROL, PO    Take 1 tablet by mouth daily. 2,000 units once daily  Modified Medications   No medications on file  Discontinued Medications   No medications on file     Review of Systems  Constitutional: Negative for activity change, appetite change, fatigue, fever and unexpected weight change.  HENT: Positive for sinus pressure. Negative for congestion, ear pain, hearing loss, rhinorrhea, sore throat, tinnitus, trouble swallowing and voice change.   Eyes: Positive for visual disturbance (macular degeneration).       Corrective lenses  Respiratory: Positive for chest tightness. Negative for cough, choking, shortness of breath and wheezing.   Cardiovascular: Positive for palpitations and leg swelling (both lower legs). Negative for chest pain.       Pacemakeer  Gastrointestinal: Negative for abdominal distention, abdominal pain, constipation, diarrhea and nausea.  Endocrine: Negative for  cold intolerance, heat intolerance, polydipsia, polyphagia and polyuria.  Genitourinary: Positive for frequency. Negative for dysuria, testicular pain and urgency.       Not incontinent. BPH.  Musculoskeletal: Positive for arthralgias, back pain and myalgias. Negative for gait problem and neck pain.  Skin: Negative for color change, pallor and rash.  Allergic/Immunologic: Negative.   Neurological: Negative for dizziness, tremors, syncope, speech difficulty, weakness, numbness and headaches.       Memory loss  Hematological: Negative for adenopathy. Bruises/bleeds easily.  Psychiatric/Behavioral: Positive for confusion. Negative for behavioral problems, decreased concentration, hallucinations and sleep  disturbance. The patient is not nervous/anxious.     There were no vitals filed for this visit. Wt Readings from Last 3 Encounters:  05/02/16 160 lb (72.6 kg)  04/29/16 157 lb (71.2 kg)  04/22/16 160 lb (72.6 kg)    There is no height or weight on file to calculate BMI.  Physical Exam  Constitutional: He is oriented to person, place, and time. He appears well-developed and well-nourished. No distress.  HENT:  Head: Normocephalic and atraumatic.  Right Ear: External ear normal.  Left Ear: External ear normal.  Nose: Nose normal.  Mouth/Throat: Oropharynx is clear and moist. No oropharyngeal exudate.  Thick white mucus draining; bilateral cerumen impaction   Eyes: Conjunctivae and EOM are normal. Pupils are equal, round, and reactive to light.  glasses  Neck: Normal range of motion. No JVD present. No tracheal deviation present. No thyromegaly present.  1 x 2 cm nodule left upper posterior neck that is mobile, not tender, and unchanging for most of his life. Feels like a lipoma  Cardiovascular: Normal rate, regular rhythm, normal heart sounds and intact distal pulses.  Exam reveals no gallop and no friction rub.   No murmur heard. Pacemaker left upper chest.  Pulmonary/Chest: Effort normal and breath sounds normal. No respiratory distress. He has no wheezes. He has no rales. He exhibits no tenderness.  No rhonchi  Abdominal: Soft. Bowel sounds are normal. He exhibits no distension and no mass. There is no tenderness.  Genitourinary:  Genitourinary Comments: BPH pon prior exam  Musculoskeletal: Normal range of motion. He exhibits no edema or tenderness.  Unsteady gait. Using a walker.  Lymphadenopathy:    He has no cervical adenopathy.  Neurological: He is alert and oriented to person, place, and time. He has normal reflexes. No cranial nerve deficit. Coordination normal.  mild cognitive changes.  Skin: No rash noted. No erythema. No pallor.  Psychiatric: He has a normal mood and  affect. His behavior is normal. Judgment and thought content normal.     Labs reviewed: Lab Summary Latest Ref Rng & Units 04/22/2016 04/21/2016 01/23/2016 01/22/2016  Hemoglobin 13.0 - 17.0 g/dL (None) 11.9(L) (None) (None)  Hematocrit 39.0 - 52.0 % (None) 34.7(L) (None) (None)  White count 4.0 - 10.5 K/uL (None) 6.6 (None) (None)  Platelet count 150 - 400 K/uL (None) 140(L) (None) (None)  Sodium 135 - 145 mmol/L 138 137 141 141  Potassium 3.5 - 5.1 mmol/L 3.9 4.1 4.3 4.3  Calcium 8.9 - 10.3 mg/dL 8.1(L) 8.5(L) (None) 8.7  Phosphorus - (None) (None) (None) (None)  Creatinine 0.61 - 1.24 mg/dL 1.48(H) 1.50(H) 1.5(A) 1.49(H)  AST 14 - 40 U/L (None) (None) 21 21  Alk Phos 25 - 125 U/L (None) (None) 69 69  Bilirubin 0.2 - 1.2 mg/dL (None) (None) (None) 0.7  Glucose 65 - 99 mg/dL 93 123(H) 92 92  Cholesterol 0 - 200 mg/dL (None) (None) 151  151  HDL cholesterol 35 - 70 mg/dL (None) (None) 78(A) 78  Triglycerides 40 - 160 mg/dL (None) (None) 44 44  LDL Direct - (None) (None) (None) (None)  LDL Calc mg/dL (None) (None) 64 64  Total protein 6.1 - 8.1 g/dL (None) (None) (None) 6.9  Albumin 3.6 - 5.1 g/dL (None) (None) (None) 3.9  Some recent data might be hidden   No results found for: TSH Lab Results  Component Value Date   BUN 21 (H) 04/22/2016   BUN 23 (H) 04/21/2016   BUN 29 (A) 01/23/2016   Lab Results  Component Value Date   CREATININE 1.48 (H) 04/22/2016   CREATININE 1.50 (H) 04/21/2016   CREATININE 1.5 (A) 01/23/2016   No results found for: HGBA1C     Assessment/Plan  1. Influenza B Resolved with some residual weakness  2. Edema, unspecified type 1-2 + and stable in both lower legs.  3. Constipation, unspecified constipation type controlled  4. Chronic kidney disease (CKD), stage III (moderate) stable  5. Benign essential HTN controlled  6. Loss of balance Unchanged. Using cane and walker.  7. Spinal stenosis of lumbar region without neurogenic  claudication Chronic low back discomfort that is not changed  8. Lipoma of neck Stable benign nodule

## 2016-05-13 DIAGNOSIS — K219 Gastro-esophageal reflux disease without esophagitis: Secondary | ICD-10-CM | POA: Diagnosis not present

## 2016-05-13 DIAGNOSIS — Z9181 History of falling: Secondary | ICD-10-CM | POA: Diagnosis not present

## 2016-05-13 DIAGNOSIS — M6281 Muscle weakness (generalized): Secondary | ICD-10-CM | POA: Diagnosis not present

## 2016-05-13 DIAGNOSIS — R1313 Dysphagia, pharyngeal phase: Secondary | ICD-10-CM | POA: Diagnosis not present

## 2016-05-13 DIAGNOSIS — R41841 Cognitive communication deficit: Secondary | ICD-10-CM | POA: Diagnosis not present

## 2016-05-13 DIAGNOSIS — R2681 Unsteadiness on feet: Secondary | ICD-10-CM | POA: Diagnosis not present

## 2016-05-14 DIAGNOSIS — R41841 Cognitive communication deficit: Secondary | ICD-10-CM | POA: Diagnosis not present

## 2016-05-14 DIAGNOSIS — R2681 Unsteadiness on feet: Secondary | ICD-10-CM | POA: Diagnosis not present

## 2016-05-14 DIAGNOSIS — R1313 Dysphagia, pharyngeal phase: Secondary | ICD-10-CM | POA: Diagnosis not present

## 2016-05-14 DIAGNOSIS — M6281 Muscle weakness (generalized): Secondary | ICD-10-CM | POA: Diagnosis not present

## 2016-05-14 DIAGNOSIS — K219 Gastro-esophageal reflux disease without esophagitis: Secondary | ICD-10-CM | POA: Diagnosis not present

## 2016-05-14 DIAGNOSIS — Z9181 History of falling: Secondary | ICD-10-CM | POA: Diagnosis not present

## 2016-05-15 DIAGNOSIS — Z9181 History of falling: Secondary | ICD-10-CM | POA: Diagnosis not present

## 2016-05-15 DIAGNOSIS — M6281 Muscle weakness (generalized): Secondary | ICD-10-CM | POA: Diagnosis not present

## 2016-05-15 DIAGNOSIS — R41841 Cognitive communication deficit: Secondary | ICD-10-CM | POA: Diagnosis not present

## 2016-05-15 DIAGNOSIS — K219 Gastro-esophageal reflux disease without esophagitis: Secondary | ICD-10-CM | POA: Diagnosis not present

## 2016-05-15 DIAGNOSIS — R1313 Dysphagia, pharyngeal phase: Secondary | ICD-10-CM | POA: Diagnosis not present

## 2016-05-15 DIAGNOSIS — R2681 Unsteadiness on feet: Secondary | ICD-10-CM | POA: Diagnosis not present

## 2016-05-16 DIAGNOSIS — R1313 Dysphagia, pharyngeal phase: Secondary | ICD-10-CM | POA: Diagnosis not present

## 2016-05-16 DIAGNOSIS — Z9181 History of falling: Secondary | ICD-10-CM | POA: Diagnosis not present

## 2016-05-16 DIAGNOSIS — K219 Gastro-esophageal reflux disease without esophagitis: Secondary | ICD-10-CM | POA: Diagnosis not present

## 2016-05-16 DIAGNOSIS — R41841 Cognitive communication deficit: Secondary | ICD-10-CM | POA: Diagnosis not present

## 2016-05-16 DIAGNOSIS — M6281 Muscle weakness (generalized): Secondary | ICD-10-CM | POA: Diagnosis not present

## 2016-05-16 DIAGNOSIS — R2681 Unsteadiness on feet: Secondary | ICD-10-CM | POA: Diagnosis not present

## 2016-05-22 DIAGNOSIS — R2681 Unsteadiness on feet: Secondary | ICD-10-CM | POA: Diagnosis not present

## 2016-05-22 DIAGNOSIS — Z9181 History of falling: Secondary | ICD-10-CM | POA: Diagnosis not present

## 2016-05-22 DIAGNOSIS — R41841 Cognitive communication deficit: Secondary | ICD-10-CM | POA: Diagnosis not present

## 2016-05-22 DIAGNOSIS — R1313 Dysphagia, pharyngeal phase: Secondary | ICD-10-CM | POA: Diagnosis not present

## 2016-05-22 DIAGNOSIS — M6281 Muscle weakness (generalized): Secondary | ICD-10-CM | POA: Diagnosis not present

## 2016-05-22 DIAGNOSIS — K219 Gastro-esophageal reflux disease without esophagitis: Secondary | ICD-10-CM | POA: Diagnosis not present

## 2016-05-27 DIAGNOSIS — Z950 Presence of cardiac pacemaker: Secondary | ICD-10-CM | POA: Diagnosis not present

## 2016-05-27 DIAGNOSIS — Z45018 Encounter for adjustment and management of other part of cardiac pacemaker: Secondary | ICD-10-CM | POA: Diagnosis not present

## 2016-06-06 ENCOUNTER — Encounter: Payer: Medicare Other | Admitting: Internal Medicine

## 2016-07-03 DIAGNOSIS — L570 Actinic keratosis: Secondary | ICD-10-CM | POA: Diagnosis not present

## 2016-07-03 DIAGNOSIS — D2271 Melanocytic nevi of right lower limb, including hip: Secondary | ICD-10-CM | POA: Diagnosis not present

## 2016-07-03 DIAGNOSIS — L814 Other melanin hyperpigmentation: Secondary | ICD-10-CM | POA: Diagnosis not present

## 2016-07-08 DIAGNOSIS — R609 Edema, unspecified: Secondary | ICD-10-CM | POA: Diagnosis not present

## 2016-07-09 ENCOUNTER — Other Ambulatory Visit: Payer: Self-pay | Admitting: *Deleted

## 2016-07-09 ENCOUNTER — Other Ambulatory Visit: Payer: Medicare Other

## 2016-07-09 LAB — COMPREHENSIVE METABOLIC PANEL
ALBUMIN: 3.8 g/dL (ref 3.6–5.1)
ALT: 16 U/L (ref 9–46)
AST: 18 U/L (ref 10–35)
Alkaline Phosphatase: 72 U/L (ref 40–115)
BILIRUBIN TOTAL: 0.4 mg/dL (ref 0.2–1.2)
BUN: 35 mg/dL — ABNORMAL HIGH (ref 7–25)
CALCIUM: 8.5 mg/dL — AB (ref 8.6–10.3)
CO2: 27 mmol/L (ref 20–31)
Chloride: 105 mmol/L (ref 98–110)
Creat: 1.71 mg/dL — ABNORMAL HIGH (ref 0.70–1.11)
Glucose, Bld: 90 mg/dL (ref 65–99)
Potassium: 4.5 mmol/L (ref 3.5–5.3)
Sodium: 140 mmol/L (ref 135–146)
TOTAL PROTEIN: 6.6 g/dL (ref 6.1–8.1)

## 2016-07-09 MED ORDER — TAMSULOSIN HCL 0.4 MG PO CAPS: 0.4000 mg | ORAL_CAPSULE | Freq: Every day | ORAL | 3 refills | Status: DC

## 2016-07-09 NOTE — Telephone Encounter (Signed)
Patient requested Rx to be sent to [facility].  

## 2016-07-17 ENCOUNTER — Telehealth: Payer: Self-pay | Admitting: *Deleted

## 2016-07-17 NOTE — Telephone Encounter (Signed)
Called patient to inform him of results, unable to catch them at home will try again today.

## 2016-07-18 ENCOUNTER — Encounter: Payer: Self-pay | Admitting: Internal Medicine

## 2016-07-25 ENCOUNTER — Encounter: Payer: Self-pay | Admitting: Nurse Practitioner

## 2016-07-25 ENCOUNTER — Non-Acute Institutional Stay: Payer: Medicare Other | Admitting: Nurse Practitioner

## 2016-07-25 DIAGNOSIS — R609 Edema, unspecified: Secondary | ICD-10-CM | POA: Diagnosis not present

## 2016-07-25 DIAGNOSIS — N138 Other obstructive and reflux uropathy: Secondary | ICD-10-CM

## 2016-07-25 DIAGNOSIS — F419 Anxiety disorder, unspecified: Secondary | ICD-10-CM

## 2016-07-25 DIAGNOSIS — N183 Chronic kidney disease, stage 3 unspecified: Secondary | ICD-10-CM

## 2016-07-25 DIAGNOSIS — J329 Chronic sinusitis, unspecified: Secondary | ICD-10-CM | POA: Diagnosis not present

## 2016-07-25 DIAGNOSIS — K5900 Constipation, unspecified: Secondary | ICD-10-CM | POA: Diagnosis not present

## 2016-07-25 DIAGNOSIS — N401 Enlarged prostate with lower urinary tract symptoms: Secondary | ICD-10-CM

## 2016-07-25 DIAGNOSIS — K219 Gastro-esophageal reflux disease without esophagitis: Secondary | ICD-10-CM

## 2016-07-25 DIAGNOSIS — I495 Sick sinus syndrome: Secondary | ICD-10-CM

## 2016-07-25 DIAGNOSIS — M479 Spondylosis, unspecified: Secondary | ICD-10-CM | POA: Diagnosis not present

## 2016-07-25 NOTE — Assessment & Plan Note (Signed)
Continue Tamsulosin 0.4mg , no urinary retention presently, 1-2x/night

## 2016-07-25 NOTE — Assessment & Plan Note (Signed)
Continue Alprazolam prn

## 2016-07-25 NOTE — Assessment & Plan Note (Signed)
S/p ablation, Pacemaker, taking Rythmol, f/u Cardiology.

## 2016-07-25 NOTE — Assessment & Plan Note (Signed)
Stable, continue Pantoprazole 40mg daily.   

## 2016-07-25 NOTE — Assessment & Plan Note (Signed)
Continue Claritin, Flonase, adding Singulair.

## 2016-07-25 NOTE — Assessment & Plan Note (Signed)
Down at night, 1+ BLE

## 2016-07-25 NOTE — Progress Notes (Signed)
Location:   [facility]   Place of Service:  Clinic (12)clinic [facility]  Provider: Marlana Latus NP  Code Status: DNR  Goals of Care: [place] 07/25/2016  Does Patient Have a Medical Advance Directive? Yes  Type of Paramedic of [place];Living will;Out of facility DNR (pink MOST or yellow form)  Does patient want to make changes to medical advance directive? No - Patient declined  Copy of [place] in Chart? Yes  Pre-existing out of facility DNR order (yellow form or pink MOST form) Yellow form placed in chart (order not valid for inpatient use)     Chief Complaint  Patient presents with  . Medical Management of Chronic Issues    (Lt) low back pain,    HPI: Patient is a 83 y.o. male seen today for the back pain, on his feet 10-15 minutes at a time, s/p back surgery, takes Tylenol q4h prn, this is chronic in nature. C/o sinusitis, post nasal drip, Flonase and Claritin are not adequate. CKD, last creat 1.71, the patient desires watchful waiting, agreed to BMP 6 months.  Hx of HTN controlled on Amlodipine 10mg , Atorvastatin 40mg , ASA 81mg . BPH no urinary retention while on Tamsulosin 0.4mg , GERD stable, taking Pantoprazole 40mg , prn MOM 27ml for constipation, atrial premature complex,  heart rate is in control, on Profatenone 150mg  daily, f/u Cardiology, had ablation, Pacemaker.  prn Alprazolam for nerves used occasionally.   Past Medical History:  Diagnosis Date  . Acid reflux 08/03/2015  . Anxiety 08/03/2015  . Avitaminosis D 05/18/2012  . Benign essential HTN 02/09/2009   Overview:  Benign Essential Hypertension   . Benign prostatic hyperplasia with urinary obstruction 12/07/2013  . Chronic kidney disease (CKD), stage III (moderate) 11/04/2011  . CN (constipation) 08/03/2015  . Cognitive changes 08/07/2015  . Degenerative arthritis of hip 05/14/2013  . Degenerative arthritis of spine 05/14/2013  . Degenerative disorder of muscle 08/03/2015  .  Dysrhythmia 2001   ablation for a-flutter  . H/O atrial flutter 08/03/2015   Overview:  Pacemaker placed 2015   . High blood pressure   . High cholesterol   . History of pacemaker 2016  . Hx of cardiac cath 2013  . Loss of balance 08/07/2015  . Lumbar canal stenosis 07/07/2013   Overview:  Lumbar laminectomy 07/07/13 - Dr. Lurene Shadow   . Lumbar scoliosis 05/14/2013  . Macular degeneration 08/07/2015  . Premature contractions, supraventricular 12/15/2008   Overview:  Atrial Premature Complex   . Presence of permanent cardiac pacemaker   . Tremor 08/07/2015  . Urinary frequency 08/07/2015    Past Surgical History:  Procedure Laterality Date  . ATRIAL FLUTTER ABLATION  2001  . CATARACT EXTRACTION Bilateral 2015  . CHOLECYSTECTOMY    . MASTOIDECTOMY  1934   lower back   . PACEMAKER PLACEMENT  2016  . SPINE SURGERY  2016  . TONSILLECTOMY  1934  . TRIGGER FINGER RELEASE Left 12/14/2015   Procedure: RELEASE TRIGGER FINGER/A-1 PULLEY ring finger left;  Surgeon: Daryll Brod, MD;  Location: [facility];  Service: Orthopedics;  Laterality: Left;  FAB    Allergies  Allergen Reactions  . Cortisone Other (See Comments)  . Neomycin Rash  . Sulfamethoxazole-Trimethoprim Other (See Comments)    Leg weakness    Allergies as of 07/25/2016      Reactions   Cortisone Other (See Comments)   Neomycin Rash   Sulfamethoxazole-trimethoprim Other (See Comments)   Leg weakness  Medication List       Accurate as of 07/25/16  2:24 PM. Always use your most recent med list.          acetaminophen 325 MG tablet Commonly known as:  TYLENOL Take 325 mg by mouth. Take 2 tablts every 4 hours as needed for minor pain/headaches for 48 hours   ALPRAZolam 0.5 MG tablet Commonly known as:  XANAX Take 0.5 mg by mouth. Take one tablet every 8 hours as needed for anxiety   amLODipine 10 MG tablet Commonly known as:  NORVASC Take 1 tablet (10 mg total) by mouth daily.   aspirin EC 81 MG  tablet Take 81 mg by mouth daily.   atorvastatin 40 MG tablet Commonly known as:  LIPITOR Take 40 mg by mouth daily.   loratadine 10 MG tablet Commonly known as:  CLARITIN Take 10 mg by mouth daily.   magnesium hydroxide 800 MG/5ML suspension Commonly known as:  MILK OF MAGNESIA Take 30 mLs by mouth as needed for constipation.   ondansetron 4 MG tablet Commonly known as:  ZOFRAN Take 1 tablet (4 mg total) by mouth every 6 (six) hours as needed for nausea.   pantoprazole 40 MG tablet Commonly known as:  PROTONIX Take 40 mg by mouth daily.   PRESERVISION AREDS Caps Take 1 capsule by mouth 2 (two) times daily. 2 by mouth daily   propafenone 150 MG tablet Commonly known as:  RYTHMOL Take 225 mg by mouth 4 (four) times daily. 1.5 tablets 4 times daily breakfast, lunch, dinner and at bedtime   tamsulosin 0.4 MG Caps capsule Commonly known as:  FLOMAX Take 1 capsule (0.4 mg total) by mouth at bedtime.   VITAMIN D (ERGOCALCIFEROL) PO Take 1 tablet by mouth daily. 2,000 units once daily       Review of Systems:  Review of Systems  Constitutional: Negative for activity change, appetite change, fatigue, fever and unexpected weight change.  HENT: Positive for postnasal drip and sinus pressure. Negative for congestion, ear pain, hearing loss, rhinorrhea, sore throat, tinnitus, trouble swallowing and voice change.   Eyes: Positive for visual disturbance (macular degeneration).       Corrective lenses  Respiratory: Positive for chest tightness. Negative for cough, choking, shortness of breath and wheezing.   Cardiovascular: Positive for palpitations and leg swelling. Negative for chest pain.       Pacemaker. Trace ankle edema.   Gastrointestinal: Negative for abdominal distention, abdominal pain, constipation, diarrhea and nausea.  Endocrine: Negative for cold intolerance, heat intolerance, polydipsia, polyphagia and polyuria.  Genitourinary: Positive for frequency. Negative for  dysuria, testicular pain and urgency.       Not incontinent  Musculoskeletal: Positive for arthralgias, back pain and myalgias. Negative for gait problem and neck pain.  Skin: Negative for color change, pallor and rash.  Allergic/Immunologic: Negative.   Neurological: Negative for dizziness, tremors, syncope, speech difficulty, weakness, numbness and headaches.       Memory loss  Hematological: Negative for adenopathy. Bruises/bleeds easily.  Psychiatric/Behavioral: Positive for confusion. Negative for behavioral problems, decreased concentration, hallucinations and sleep disturbance. The patient is not nervous/anxious.     Health Maintenance  Topic Date Due  . PNA vac Low Risk Adult (1 of 2 - PCV13) 07/31/1991  . TETANUS/TDAP  02/25/2006  . INFLUENZA VACCINE  09/25/2016    Physical Exam: Vitals:   07/25/16 1334  BP: 112/60  Pulse: 84  Resp: 20  Temp: 98.1 F (36.7 C)  SpO2: 92%  Weight: 168 lb 3.2 oz (76.3 kg)  Height: 5\' 9"  (1.753 m)   Body mass index is 24.84 kg/m. Physical Exam  Constitutional: He is oriented to person, place, and time. He appears well-developed and well-nourished. No distress.  HENT:  Right Ear: External ear normal.  Left Ear: External ear normal.  Nose: Nose normal.  Mouth/Throat: Oropharynx is clear and moist. No oropharyngeal exudate.  Eyes: Conjunctivae and EOM are normal. Pupils are equal, round, and reactive to light.  Neck: No JVD present. No tracheal deviation present. No thyromegaly present.  Cardiovascular: Normal rate, regular rhythm, normal heart sounds and intact distal pulses.  Exam reveals no gallop and no friction rub.   No murmur heard. Pulmonary/Chest: No respiratory distress. He has no wheezes. He has no rales. He exhibits no tenderness.  Abdominal: He exhibits no distension and no mass. There is no tenderness.  Musculoskeletal: Normal range of motion. He exhibits edema. He exhibits no tenderness.  Trace edema in ankles, chronic.    Lymphadenopathy:    He has no cervical adenopathy.  Neurological: He is alert and oriented to person, place, and time. He has normal reflexes. No cranial nerve deficit. Coordination normal.  Skin: No rash noted. No erythema. No pallor.  Psychiatric: He has a normal mood and affect. His behavior is normal. Judgment and thought content normal.    Labs reviewed: Basic Metabolic Panel:  Recent Labs  04/21/16 1859 04/22/16 0417 07/08/16 0811  NA 137 138 140  K 4.1 3.9 4.5  CL 106 108 105  CO2 22 25 27   GLUCOSE 123* 93 90  BUN 23* 21* 35*  CREATININE 1.50* 1.48* 1.71*  CALCIUM 8.5* 8.1* 8.5*   Liver Function Tests:  Recent Labs  01/22/16 0001 01/23/16 07/08/16 0811  AST 21 21 18   ALT 17 17 16   ALKPHOS 69 69 72  BILITOT 0.7  --  0.4  PROT 6.9  --  6.6  ALBUMIN 3.9  --  3.8   No results for input(s): LIPASE, AMYLASE in the last 8760 hours. No results for input(s): AMMONIA in the last 8760 hours. CBC:  Recent Labs  04/21/16 1859  WBC 6.6  NEUTROABS 4.5  HGB 11.9*  HCT 34.7*  MCV 91.3  PLT 140*   Lipid Panel:  Recent Labs  01/22/16 0001 01/23/16  CHOL 151 151  HDL 78 78*  LDLCALC 64 64  TRIG 44 44  CHOLHDL 1.9  --    No results found for: HGBA1C  Procedures since last visit: No results found.  Assessment/Plan Chronic kidney disease (CKD), stage III (moderate) 07/25/16 Na 138, K 3.9, Bun 25, creat 1.71 The patient delayed Nephrology, desires CMP 6 months, observe  Sick sinus syndrome [facility]) S/p ablation, Pacemaker, taking Rythmol, f/u Cardiology.   Constipation MOM as needed  Acid reflux Stable, continue Pantoprazole 40mg  daily.  Degenerative arthritis of spine S/p back surgery, pain, on feet no longer than 10-75minutes  Benign prostatic hyperplasia with urinary obstruction Continue Tamsulosin 0.4mg , no urinary retention presently, 1-2x/night  Anxiety Continue Alprazolam prn  Edema Down at night, 1+ BLE  Sinusitis Continue Claritin,  Flonase, adding Singulair.     Labs/tests ordered: BMP prior to next appointment.   Next appt:  6 months

## 2016-07-25 NOTE — Assessment & Plan Note (Signed)
07/25/16 Na 138, K 3.9, Bun 25, creat 1.71 The patient delayed Nephrology, desires CMP 6 months, observe

## 2016-07-25 NOTE — Assessment & Plan Note (Signed)
MOM as needed

## 2016-07-25 NOTE — Assessment & Plan Note (Signed)
S/p back surgery, pain, on feet no longer than 10-20minutes

## 2016-08-14 ENCOUNTER — Other Ambulatory Visit: Payer: Self-pay | Admitting: *Deleted

## 2016-08-14 MED ORDER — ATORVASTATIN CALCIUM 40 MG PO TABS: 40.0000 mg | ORAL_TABLET | Freq: Every day | ORAL | 0 refills | Status: DC

## 2016-08-20 DIAGNOSIS — Z45018 Encounter for adjustment and management of other part of cardiac pacemaker: Secondary | ICD-10-CM | POA: Diagnosis not present

## 2016-08-20 DIAGNOSIS — I495 Sick sinus syndrome: Secondary | ICD-10-CM | POA: Diagnosis not present

## 2016-08-20 DIAGNOSIS — Z45010 Encounter for checking and testing of cardiac pacemaker pulse generator [battery]: Secondary | ICD-10-CM | POA: Diagnosis not present

## 2016-08-20 DIAGNOSIS — Z950 Presence of cardiac pacemaker: Secondary | ICD-10-CM | POA: Diagnosis not present

## 2016-08-20 DIAGNOSIS — R002 Palpitations: Secondary | ICD-10-CM | POA: Diagnosis not present

## 2016-08-29 ENCOUNTER — Other Ambulatory Visit: Payer: Self-pay | Admitting: *Deleted

## 2016-08-29 MED ORDER — PANTOPRAZOLE SODIUM 40 MG PO TBEC: 40.0000 mg | DELAYED_RELEASE_TABLET | Freq: Every day | ORAL | 3 refills | Status: DC

## 2016-09-02 DIAGNOSIS — I495 Sick sinus syndrome: Secondary | ICD-10-CM | POA: Diagnosis not present

## 2016-09-17 ENCOUNTER — Encounter: Payer: Self-pay | Admitting: Internal Medicine

## 2016-09-17 ENCOUNTER — Non-Acute Institutional Stay: Payer: Medicare Other | Admitting: Internal Medicine

## 2016-09-17 VITALS — BP 124/60 | HR 60 | Temp 97.7°F | Resp 16 | Ht 69.0 in | Wt 169.4 lb

## 2016-09-17 DIAGNOSIS — R600 Localized edema: Secondary | ICD-10-CM | POA: Diagnosis not present

## 2016-09-17 DIAGNOSIS — M48062 Spinal stenosis, lumbar region with neurogenic claudication: Secondary | ICD-10-CM

## 2016-09-17 DIAGNOSIS — M545 Low back pain, unspecified: Secondary | ICD-10-CM

## 2016-09-17 MED ORDER — METHOCARBAMOL 500 MG PO TABS: 500.0000 mg | ORAL_TABLET | Freq: Two times a day (BID) | ORAL | 0 refills | Status: DC

## 2016-09-17 MED ORDER — NAPROXEN 500 MG PO TABS: 500.0000 mg | ORAL_TABLET | Freq: Two times a day (BID) | ORAL | 0 refills | Status: DC

## 2016-09-17 NOTE — Progress Notes (Signed)
Facility  [facility]    Place of Service: Clinic (12)     Allergies  Allergen Reactions  . Cortisone Other (See Comments)  . Neomycin Rash  . Sulfamethoxazole-Trimethoprim Other (See Comments)    Leg weakness    Chief Complaint  Patient presents with  . Acute Visit    Left lower back pain( chronic). Patient stated that the pain has gotten worse which is why he came to clinic today. While getting up from the chair the patient stated that he sometimes has spasms in his lower back.  . Medication Refill    Patient stated that he doesn't need any refills at this time  . Medication Management    Patient is taking naproxen versus Tylenol he stated that its stronger. He is also no longer taking his xanax or zofran    HPI:  83 y/o male pt seen today for acute visit. He has been having acute on chronic back pain. The pain has worsened over past few months. He has been experiencing bad muscle spasm to left lower back that has been more frequent. The pain does not radiate to his buttock or legs. Occasional numbness and tingling to both his legs. Denies leg pain or muscle spasm to legs. His pain is limiting his mobility, making his transfer harder. He is using walker now even for short distances and sometimes cane. Earlier, he had to use a walker only for long distance. He denies weakness to his legs. Denies any shortness of breath. Of note, He was picking some object between bed and dresser and had a fall 5 months back. No other injury or fall reported. Following this, he was in the hospital with influenza. He has history of chronic back pain and he has undergone L4-5 decompression surgery in 06/2013.              Medications: Patient's Medications  New Prescriptions   No medications on file  Previous Medications   ACETAMINOPHEN (TYLENOL) 325 MG TABLET    Take 325 mg by mouth. Take 2 tablts every 4 hours as needed for minor pain/headaches for 48 hours   ALPRAZOLAM (XANAX) 0.5 MG TABLET    Take 0.5  mg by mouth. Take one tablet every 8 hours as needed for anxiety   AMLODIPINE (NORVASC) 10 MG TABLET    Take 1 tablet (10 mg total) by mouth daily.   ASPIRIN EC 81 MG TABLET    Take 81 mg by mouth daily.   ATORVASTATIN (LIPITOR) 40 MG TABLET    Take 1 tablet (40 mg total) by mouth daily.   LORATADINE (CLARITIN) 10 MG TABLET    Take 10 mg by mouth daily.   MAGNESIUM HYDROXIDE (MILK OF MAGNESIA) 800 MG/5ML SUSPENSION    Take 30 mLs by mouth as needed for constipation.   MULTIPLE VITAMINS-MINERALS (PRESERVISION AREDS) CAPS    Take 1 capsule by mouth 2 (two) times daily. 2 by mouth daily    ONDANSETRON (ZOFRAN) 4 MG TABLET    Take 1 tablet (4 mg total) by mouth every 6 (six) hours as needed for nausea.   PANTOPRAZOLE (PROTONIX) 40 MG TABLET    Take 1 tablet (40 mg total) by mouth daily.   PROPAFENONE (RYTHMOL) 150 MG TABLET    Take 225 mg by mouth 4 (four) times daily. 1.5 tablets 4 times daily breakfast, lunch, dinner and at bedtime    TAMSULOSIN (FLOMAX) 0.4 MG CAPS CAPSULE    Take 1 capsule (0.4 mg  total) by mouth at bedtime.   VITAMIN D, ERGOCALCIFEROL, PO    Take 1 tablet by mouth daily. 2,000 units once daily  Modified Medications   No medications on file  Discontinued Medications   No medications on file     Review of Systems  Constitutional: Positive for activity change. Negative for appetite change, chills, fatigue, fever and unexpected weight change.       Limited mobility with pain  HENT: Positive for postnasal drip. Negative for congestion, ear pain, hearing loss, mouth sores, rhinorrhea, sinus pressure, sore throat, tinnitus, trouble swallowing and voice change.   Eyes: Positive for visual disturbance (macular degeneration).       Corrective lenses  Respiratory: Negative for cough, choking, chest tightness, shortness of breath and wheezing.   Cardiovascular: Positive for palpitations and leg swelling (both lower legs). Negative for chest pain.       Pacemakeer  Gastrointestinal:  Positive for constipation. Negative for abdominal pain, diarrhea, nausea and vomiting.  Genitourinary: Negative for dysuria, flank pain and frequency.       Not incontinent. BPH.  Musculoskeletal: Positive for arthralgias, back pain and gait problem. Negative for myalgias and neck pain.  Skin: Negative for rash.  Allergic/Immunologic: Negative.   Neurological: Positive for dizziness, tremors and numbness. Negative for seizures, syncope, speech difficulty, weakness and headaches.       Change in position makes him dizzy  Hematological: Negative for adenopathy. Bruises/bleeds easily.  Psychiatric/Behavioral: Positive for confusion. Negative for behavioral problems, decreased concentration, hallucinations and sleep disturbance. The patient is not nervous/anxious.     Vitals:   09/17/16 1103  BP: 124/60  Pulse: 60  Resp: 16  Temp: 97.7 F (36.5 C)  TempSrc: Oral  SpO2: 94%  Weight: 169 lb 6.4 oz (76.8 kg)  Height: 5' 9" (1.753 m)   Wt Readings from Last 3 Encounters:  09/17/16 169 lb 6.4 oz (76.8 kg)  07/25/16 168 lb 3.2 oz (76.3 kg)  05/09/16 167 lb (75.8 kg)    Body mass index is 25.02 kg/m.  Physical Exam  Constitutional: He is oriented to person, place, and time. He appears well-developed and well-nourished. No distress.  HENT:  Head: Normocephalic and atraumatic.  Mouth/Throat: Oropharynx is clear and moist. No oropharyngeal exudate.  Eyes: EOM are normal.  glasses  Neck: Normal range of motion. Neck supple.  1 x 2 cm nodule left upper posterior neck that is mobile, not tender, and unchanging for most of his life. Feels like a lipoma  Cardiovascular: Normal rate and regular rhythm.   No murmur heard. Pacemaker left upper chest.  Pulmonary/Chest: Effort normal and breath sounds normal. No respiratory distress. He has no wheezes.  No rhonchi  Abdominal: Soft. Bowel sounds are normal. He exhibits no distension. There is no tenderness.  Musculoskeletal: Normal range of  motion. He exhibits edema. He exhibits no tenderness.  Stoops forward. Unsteady gait. Using a walker. No spinal tenderness. Right Lumbosacral para-spinal tenderness on exam with muscle tightness. 1+ right and trace left leg edema  Neurological: He is alert and oriented to person, place, and time. He has normal reflexes. No cranial nerve deficit.  mild cognitive changes.  Skin: Skin is warm and dry. No rash noted. No erythema. No pallor.  Psychiatric: He has a normal mood and affect. His behavior is normal. Judgment and thought content normal.     Labs reviewed: Lab Summary Latest Ref Rng & Units 07/08/2016 04/22/2016 04/21/2016 01/23/2016 01/22/2016  Hemoglobin 13.0 - 17.0 g/dL (None) (  None) 11.9(L) (None) (None)  Hematocrit 39.0 - 52.0 % (None) (None) 34.7(L) (None) (None)  White count 4.0 - 10.5 K/uL (None) (None) 6.6 (None) (None)  Platelet count 150 - 400 K/uL (None) (None) 140(L) (None) (None)  Sodium 135 - 146 mmol/L 140 138 137 141 141  Potassium 3.5 - 5.3 mmol/L 4.5 3.9 4.1 4.3 4.3  Calcium 8.6 - 10.3 mg/dL 8.5(L) 8.1(L) 8.5(L) (None) 8.7  Phosphorus - (None) (None) (None) (None) (None)  Creatinine 0.70 - 1.11 mg/dL 1.71(H) 1.48(H) 1.50(H) 1.5(A) 1.49(H)  AST 10 - 35 U/L 18 (None) (None) 21 21  Alk Phos 40 - 115 U/L 72 (None) (None) 69 69  Bilirubin 0.2 - 1.2 mg/dL 0.4 (None) (None) (None) 0.7  Glucose 65 - 99 mg/dL 90 93 123(H) 92 92  Cholesterol 0 - 200 mg/dL (None) (None) (None) 151 151  HDL cholesterol 35 - 70 mg/dL (None) (None) (None) 78(A) 78  Triglycerides 40 - 160 mg/dL (None) (None) (None) 44 44  LDL Direct - (None) (None) (None) (None) (None)  LDL Calc mg/dL (None) (None) (None) 64 64  Total protein 6.1 - 8.1 g/dL 6.6 (None) (None) (None) 6.9  Albumin 3.6 - 5.1 g/dL 3.8 (None) (None) (None) 3.9  Some recent data might be hidden   No results found for: TSH Lab Results  Component Value Date   BUN 35 (H) 07/08/2016   BUN 21 (H) 04/22/2016   BUN 23 (H) 04/21/2016    Lab Results  Component Value Date   CREATININE 1.71 (H) 07/08/2016   CREATININE 1.48 (H) 04/22/2016   CREATININE 1.50 (H) 04/21/2016   No results found for: HGBA1C     Assessment/Plan  1. Lumbosacral pain Has history of lumbar spinal stenosis, lumbar scoliosis. Progression of this along with recent fall could have exacerbated his pain. No neurological deficit on exam. Obtain xray of lumbar and sacral area to rule out fracture/ dislocation with his hx of fall. Back precautions. Informed about warning signs of cord compression. Pt and wife aware.  - methocarbamol (ROBAXIN) 500 MG tablet; Take 1 tablet (500 mg total) by mouth 2 (two) times daily. For 2 weeks for muscle spasm  Dispense: 30 tablet; Refill: 0 - naproxen (NAPROSYN) 500 MG tablet; Take 1 tablet (500 mg total) by mouth 2 (two) times daily with a meal.  Dispense: 60 tablet; Refill: 0 - DG Lumbar Spine Complete; Future - DG Sacrum/Coccyx; Future - Ambulatory referral to Orthopedic Surgery  2. Spinal stenosis of lumbar region with neurogenic claudication Acute on chronic back pain With signs of neurogenic claudication at present. Pain worsens with movement. No sensory loss. Strength adequate to both his legs. Referral to orthopedic with his known history of stenosis and now new symptoms. No signs of cord compression noted.   3. Leg edema, right Worsened compared to left which is new per pt. No signs of infection. homan's sign negative. Good pulses. Monitor clinically. Advised to keep legs elevated at rest.   Follow up in 1 week if symptoms dont improve   , MD Internal Medicine [facility] [address] [place], [place] [address] Cell Phone (Monday-Friday 8 am - 5 pm): [phone] On Call: [phone] and follow prompts after 5 pm and on weekends Office Phone: [phone] Office Fax: [phone]   

## 2016-09-24 ENCOUNTER — Encounter: Payer: Self-pay | Admitting: Internal Medicine

## 2016-09-24 ENCOUNTER — Non-Acute Institutional Stay: Payer: Medicare Other | Admitting: Internal Medicine

## 2016-09-24 VITALS — BP 130/62 | HR 65 | Temp 97.9°F | Resp 16 | Ht 69.0 in | Wt 169.6 lb

## 2016-09-24 DIAGNOSIS — M545 Low back pain, unspecified: Secondary | ICD-10-CM

## 2016-09-24 DIAGNOSIS — R296 Repeated falls: Secondary | ICD-10-CM

## 2016-09-24 DIAGNOSIS — Z289 Immunization not carried out for unspecified reason: Secondary | ICD-10-CM

## 2016-09-24 DIAGNOSIS — R2681 Unsteadiness on feet: Secondary | ICD-10-CM

## 2016-09-24 DIAGNOSIS — R4189 Other symptoms and signs involving cognitive functions and awareness: Secondary | ICD-10-CM | POA: Diagnosis not present

## 2016-09-24 NOTE — Progress Notes (Signed)
Facility  [facility]    Place of Service: Clinic (12)     Allergies  Allergen Reactions  . Cortisone Other (See Comments)  . Neomycin Rash  . Sulfamethoxazole-Trimethoprim Other (See Comments)    Leg weakness    Chief Complaint  Patient presents with  . Acute Visit    back pain follow up    HPI:  83 y/o male pt seen today for follow up visit for acute on chronic back pain. The pain has somewhat improved but persists. Denies any further muscle spasm. Robaxin has been helpful. He is taking naproxen as needed only and this seems to help with his pain. Denies any new neurological signs.Occasional numbness and tingling to both his legs. Denies leg pain or muscle spasm to legs. His pain is limiting his mobility, making his transfer harder. He continues to use his walker. No new fall reported. He has not done xray of his spine yet. He denies weakness to his legs. Of note, He was picking some object between bed and dresser and had a fall 5 months back. No other injury or fall reported. Following this, he was in the hospital with influenza. He has history of chronic back pain and he has undergone L4-5 decompression surgery in 06/2013.   He is due for tDAP vaccine but wants to wait on the vaccine  He had a fall 5 months back as above and wife mentions about another fall 6 months back from his chair that was unsteady. No apparent injury reported besides ongoing back pain.   During today's visit, pt appears to have some memory issue. He does not remember being asked to go for his xray. He mentions not having received any call from our office or orthopedic office about appointment but on chart review, has new pt appointment with orthopedic service.              Medications: Patient's Medications  New Prescriptions   No medications on file  Previous Medications   ALPRAZOLAM (XANAX) 0.5 MG TABLET    Take 0.5 mg by mouth as needed.    AMLODIPINE (NORVASC) 10 MG TABLET    Take 1 tablet (10 mg total)  by mouth daily.   ASPIRIN EC 81 MG TABLET    Take 81 mg by mouth daily.   ATORVASTATIN (LIPITOR) 40 MG TABLET    Take 1 tablet (40 mg total) by mouth daily.   LORATADINE (CLARITIN) 10 MG TABLET    Take 10 mg by mouth daily.   MAGNESIUM HYDROXIDE (MILK OF MAGNESIA) 800 MG/5ML SUSPENSION    Take 30 mLs by mouth as needed for constipation.   METHOCARBAMOL (ROBAXIN) 500 MG TABLET    Take 1,000 mg by mouth daily as needed for muscle spasms.   MULTIPLE VITAMINS-MINERALS (PRESERVISION AREDS) CAPS    Take 2 capsules by mouth daily. 2 by mouth daily    NAPROXEN (NAPROSYN) 500 MG TABLET    Take 1,000 mg by mouth daily as needed.   PANTOPRAZOLE (PROTONIX) 40 MG TABLET    Take 1 tablet (40 mg total) by mouth daily.   PROPAFENONE (RYTHMOL) 150 MG TABLET    Take 225 mg by mouth 4 (four) times daily. 1.5 tablets 4 times daily breakfast, lunch, dinner and at bedtime    TAMSULOSIN (FLOMAX) 0.4 MG CAPS CAPSULE    Take 1 capsule (0.4 mg total) by mouth at bedtime.   VITAMIN D, ERGOCALCIFEROL, PO    Take 1 tablet by mouth daily.  2,000 units once daily  Modified Medications   No medications on file  Discontinued Medications   ACETAMINOPHEN (TYLENOL) 325 MG TABLET    Take 325 mg by mouth. Take 2 tablts every 4 hours as needed for minor pain/headaches for 48 hours   METHOCARBAMOL (ROBAXIN) 500 MG TABLET    Take 1 tablet (500 mg total) by mouth 2 (two) times daily. For 2 weeks for muscle spasm   NAPROXEN (NAPROSYN) 500 MG TABLET    Take 1 tablet (500 mg total) by mouth 2 (two) times daily with a meal.   ONDANSETRON (ZOFRAN) 4 MG TABLET    Take 1 tablet (4 mg total) by mouth every 6 (six) hours as needed for nausea.     Review of Systems  Constitutional: Positive for activity change. Negative for chills and fever.       Limited mobility with pain  HENT: Positive for postnasal drip. Negative for congestion, sore throat, tinnitus, trouble swallowing and voice change.   Eyes: Positive for visual disturbance (macular  degeneration).       Corrective lenses  Respiratory: Negative for cough, choking, chest tightness, shortness of breath and wheezing.   Cardiovascular: Positive for palpitations and leg swelling (both lower legs). Negative for chest pain.       Pacemakeer  Gastrointestinal: Positive for constipation. Negative for abdominal pain, diarrhea, nausea and vomiting.  Genitourinary: Negative for dysuria, flank pain and frequency.       Not incontinent. BPH.  Musculoskeletal: Positive for arthralgias, back pain and gait problem. Negative for myalgias and neck pain.  Skin: Negative for rash.  Allergic/Immunologic: Negative.   Neurological: Positive for tremors and numbness. Negative for dizziness, seizures, syncope, speech difficulty, weakness and headaches.  Hematological: Negative for adenopathy. Bruises/bleeds easily.  Psychiatric/Behavioral: Positive for confusion. Negative for behavioral problems, decreased concentration, hallucinations and sleep disturbance. The patient is not nervous/anxious.     Vitals:   09/24/16 0829  BP: 130/62  Pulse: 65  Resp: 16  Temp: 97.9 F (36.6 C)  TempSrc: Oral  SpO2: 97%  Weight: 169 lb 9.6 oz (76.9 kg)  Height: 5' 9"  (1.753 m)   Wt Readings from Last 3 Encounters:  09/24/16 169 lb 9.6 oz (76.9 kg)  09/17/16 169 lb 6.4 oz (76.8 kg)  07/25/16 168 lb 3.2 oz (76.3 kg)    Body mass index is 25.05 kg/m.  Physical Exam  Constitutional: He is oriented to person, place, and time. He appears well-developed and well-nourished. No distress.  HENT:  Head: Normocephalic and atraumatic.  Mouth/Throat: Oropharynx is clear and moist. No oropharyngeal exudate.  Eyes: EOM are normal.  glasses  Neck: Normal range of motion. Neck supple.  1 x 2 cm nodule left upper posterior neck that is mobile, not tender, and unchanging for most of his life. Feels like a lipoma  Cardiovascular: Normal rate and regular rhythm.   No murmur heard. Pacemaker left upper chest.    Pulmonary/Chest: Effort normal and breath sounds normal. No respiratory distress. He has no wheezes.  No rhonchi  Abdominal: Soft. Bowel sounds are normal. He exhibits no distension. There is no tenderness.  Musculoskeletal: He exhibits edema. He exhibits no tenderness.  Stoops forward. Unsteady gait. Using a walker. No spinal tenderness. Right Lumbosacral para-spinal tenderness on exam. No muscle tightness. 1+ right and trace left leg edema. Uses his hand to support his balance while getting up from chair.   Neurological: He is alert and oriented to person, place, and time. He has  normal reflexes. No cranial nerve deficit.  mild cognitive changes.  Skin: Skin is warm and dry. No rash noted. No erythema. No pallor.  Psychiatric: He has a normal mood and affect. His behavior is normal. Judgment and thought content normal.     Labs reviewed: Lab Summary Latest Ref Rng & Units 07/08/2016 04/22/2016 04/21/2016 01/23/2016 01/22/2016  Hemoglobin 13.0 - 17.0 g/dL (None) (None) 11.9(L) (None) (None)  Hematocrit 39.0 - 52.0 % (None) (None) 34.7(L) (None) (None)  White count 4.0 - 10.5 K/uL (None) (None) 6.6 (None) (None)  Platelet count 150 - 400 K/uL (None) (None) 140(L) (None) (None)  Sodium 135 - 146 mmol/L 140 138 137 141 141  Potassium 3.5 - 5.3 mmol/L 4.5 3.9 4.1 4.3 4.3  Calcium 8.6 - 10.3 mg/dL 8.5(L) 8.1(L) 8.5(L) (None) 8.7  Phosphorus - (None) (None) (None) (None) (None)  Creatinine 0.70 - 1.11 mg/dL 1.71(H) 1.48(H) 1.50(H) 1.5(A) 1.49(H)  AST 10 - 35 U/L 18 (None) (None) 21 21  Alk Phos 40 - 115 U/L 72 (None) (None) 69 69  Bilirubin 0.2 - 1.2 mg/dL 0.4 (None) (None) (None) 0.7  Glucose 65 - 99 mg/dL 90 93 123(H) 92 92  Cholesterol 0 - 200 mg/dL (None) (None) (None) 151 151  HDL cholesterol 35 - 70 mg/dL (None) (None) (None) 78(A) 78  Triglycerides 40 - 160 mg/dL (None) (None) (None) 44 44  LDL Direct - (None) (None) (None) (None) (None)  LDL Calc mg/dL (None) (None) (None) 64 64   Total protein 6.1 - 8.1 g/dL 6.6 (None) (None) (None) 6.9  Albumin 3.6 - 5.1 g/dL 3.8 (None) (None) (None) 3.9  Some recent data might be hidden   No results found for: TSH Lab Results  Component Value Date   BUN 35 (H) 07/08/2016   BUN 21 (H) 04/22/2016   BUN 23 (H) 04/21/2016   Lab Results  Component Value Date   CREATININE 1.71 (H) 07/08/2016   CREATININE 1.48 (H) 04/22/2016   CREATININE 1.50 (H) 04/21/2016   No results found for: HGBA1C     Assessment/Plan  1. Lumbosacral pain Ongoing with acute on chronic, slightly improved since last visit, has some neurologic symptom with numbness and tingling along with pain. Pending xray of lumbar and sacral area to rule out fracture/ dislocation with his hx of 2 falls. Back precautions. Informed about warning signs of cord compression. Advised to use assistive device during transfer and activity. Address for imaging center provided. Has orthopedic appointment is on 10/09/16. Pt uptodate with care plan but appears to have some memory issue.   2. Cognitive impairment Reviewed this with wife. She wants to wait on performing MMSE. She would like to talk with their daughter and revisit further. She will call us to set up appointment for memory evaluation.   3. Tetanus vaccination not carried out Pt does not want tdap this visit. Reassess next visit.   4. Frequent falls 2 falls over last 6 months- last one 5 months back. Advised to use his walker all the time. Safe transfer recommended. Pt to obtain xray and f/u with orthopedic. PT referral. Strength adequate in all extremities at present.   Fall Risk  09/24/2016 08/03/2015  Falls in the past year? Yes No  Number falls in past yr: 2 or more -  Injury with Fall? No -  Risk Factor Category  High Fall Risk -  Risk for fall due to : Impaired balance/gait -  Follow up Falls evaluation completed;Education provided;Falls prevention discussed;Follow up appointment -  5. Unsteady gait PT  referral as above  I have spent greater than 30 minutes in this visit, reviewed care plan with pt and his wife.   Blanchie Serve, MD Internal Medicine [facility] [address] [place], [place] [address] Cell Phone (Monday-Friday 8 am - 5 pm): [phone] On Call: [phone] and follow prompts after 5 pm and on weekends Office Phone: [phone] Office Fax: [phone]

## 2016-09-25 ENCOUNTER — Ambulatory Visit
Admission: RE | Admit: 2016-09-25 | Discharge: 2016-09-25 | Disposition: A | Discharge status: Home / Self Care | Payer: Medicare Other | Source: Ambulatory Visit | Attending: Internal Medicine | Admitting: Internal Medicine

## 2016-09-25 DIAGNOSIS — M533 Sacrococcygeal disorders, not elsewhere classified: Secondary | ICD-10-CM | POA: Diagnosis not present

## 2016-09-25 DIAGNOSIS — M545 Low back pain, unspecified: Secondary | ICD-10-CM

## 2016-09-25 DIAGNOSIS — M5127 Other intervertebral disc displacement, lumbosacral region: Secondary | ICD-10-CM | POA: Diagnosis not present

## 2016-09-30 ENCOUNTER — Other Ambulatory Visit: Payer: Self-pay | Admitting: Internal Medicine

## 2016-10-07 DIAGNOSIS — M6281 Muscle weakness (generalized): Secondary | ICD-10-CM | POA: Diagnosis not present

## 2016-10-07 DIAGNOSIS — K219 Gastro-esophageal reflux disease without esophagitis: Secondary | ICD-10-CM | POA: Diagnosis not present

## 2016-10-07 DIAGNOSIS — I4891 Unspecified atrial fibrillation: Secondary | ICD-10-CM | POA: Diagnosis not present

## 2016-10-07 DIAGNOSIS — R2681 Unsteadiness on feet: Secondary | ICD-10-CM | POA: Diagnosis not present

## 2016-10-07 DIAGNOSIS — M545 Low back pain: Secondary | ICD-10-CM | POA: Diagnosis not present

## 2016-10-07 DIAGNOSIS — F419 Anxiety disorder, unspecified: Secondary | ICD-10-CM | POA: Diagnosis not present

## 2016-10-07 DIAGNOSIS — I739 Peripheral vascular disease, unspecified: Secondary | ICD-10-CM | POA: Diagnosis not present

## 2016-10-09 ENCOUNTER — Encounter (INDEPENDENT_AMBULATORY_CARE_PROVIDER_SITE_OTHER): Payer: Self-pay | Admitting: Orthopaedic Surgery

## 2016-10-09 ENCOUNTER — Ambulatory Visit (INDEPENDENT_AMBULATORY_CARE_PROVIDER_SITE_OTHER): Payer: Medicare Other | Admitting: Orthopaedic Surgery

## 2016-10-09 VITALS — BP 162/76 | HR 64 | Ht 69.0 in | Wt 165.0 lb

## 2016-10-09 DIAGNOSIS — M419 Scoliosis, unspecified: Secondary | ICD-10-CM

## 2016-10-09 DIAGNOSIS — M48062 Spinal stenosis, lumbar region with neurogenic claudication: Secondary | ICD-10-CM

## 2016-10-09 NOTE — Progress Notes (Signed)
Office Visit Note   Patient: Stanley Hunter           Date of Birth: [birth year]           MRN: [id number] Visit Date: 10/09/2016              Requested by: Blanchie Serve, MD [address] [place], [place] [address] PCP: Blanchie Serve, MD   Assessment & Plan: Visit Diagnoses:  1. Scoliosis of lumbar spine, unspecified scoliosis type   2. Spinal stenosis of lumbar region with neurogenic claudication     Plan: We'll obtain a plain CT scan for evaluation. He likely has some foraminal stenosis with a significant scoliosis. Office follow-up  CT scan.  Follow-Up Instructions: No Follow-up on file.   Orders:  No orders of the defined types were placed in this encounter.  No orders of the defined types were placed in this encounter.     Procedures: No procedures performed   Clinical Data: No additional findings.   Subjective: Chief Complaint  Patient presents with  . Lower Back - Pain    HPI 83 year old male seen for first time visit with complaints of persistent back pain. He said 2 falls the last 6 months previous decompression by Dr. Lurene Shadow that NovoLog for L4-5 stenosis. Patient has significant lumbar scoliosis and has had no improvement in his ability to stand more than 5-10 minutes or ambulate since the surgery. His incision was well-healed. He uses Naprosyn and Robaxin without relief. Patient denies associated bowel or bladder symptoms. No fever or chills. Incision healed well and he had no postoperative complications. He has not had any repeat imaging since this procedure and does have a pacemaker left chest wall.  Review of Systems positive for mild osteoarthritis left hip. Significant lumbar scoliosis. Stage III kidney disease , hypertension, history of spinal stenosis surgery L4-5. PVCs. History of pacemaker. Benign prostatic hypertrophy. Otherwise the rest review of systems is negative as it pertains history of present illness   Objective: Vital Signs: BP (!)  162/76   Pulse 64   Ht 5\' 9"  (1.753 m)   Wt 165 lb (74.8 kg)   BMI 24.37 kg/m   Physical Exam  Constitutional: He is oriented to person, place, and time. He appears well-developed and well-nourished.  HENT:  Head: Normocephalic and atraumatic.  Eyes: Pupils are equal, round, and reactive to light. EOM are normal.  Neck: No tracheal deviation present. No thyromegaly present.  Cardiovascular: Normal rate.   Pulmonary/Chest: Effort normal. He has no wheezes.  Abdominal: Soft. Bowel sounds are normal.  Musculoskeletal:  Patient's a majority of the office without limp. Anterior tib EHL gastrocsoleus quads are strong and symmetrical. He has some pitting edema bilaterally get worse later today. Does have palpable posterior tibial pulses. Feet are warm. No rash or exposed skin. Negative straight leg raising 90. Some sciatic notch tenderness worse on the left side than right. Minimal discomfort with internal rotation left hip 30 and 20 on the right. This is not similar to the pain that he has.  Neurological: He is alert and oriented to person, place, and time.  Skin: Skin is warm and dry. Capillary refill takes less than 2 seconds.  Psychiatric: He has a normal mood and affect. His behavior is normal. Judgment and thought content normal.    Ortho Exam well-healed small incision just off the midline at L4-5 well-healed no tenderness.  Specialty Comments:  No specialty comments available.  Imaging: No results found.  PMFS History: Patient Active Problem List   Diagnosis Date Noted  . Sinusitis 07/25/2016  . Lipoma 05/09/2016  . Influenza B 04/21/2016  . Edema 02/01/2016  . Pain 11/24/2015  . Trigger ring finger of left hand 11/16/2015  . Tremor 08/07/2015  . Loss of balance 08/07/2015  . Macular degeneration 08/07/2015  . Urinary frequency 08/07/2015  . Cognitive changes 08/07/2015  . History of pacemaker   . Anxiety 08/03/2015  . Constipation 08/03/2015  . Acid reflux  08/03/2015  . H/O atrial flutter 08/03/2015  . Myalgia 08/03/2015  . Benign prostatic hyperplasia with urinary obstruction 12/07/2013  . Sick sinus syndrome ([place]) 11/18/2013  . Spinal stenosis of lumbar region with neurogenic claudication 07/07/2013  . Degenerative arthritis of hip 05/14/2013  . Degenerative arthritis of spine 05/14/2013  . Lumbar scoliosis 05/14/2013  . Avitaminosis D 05/18/2012  . Chronic kidney disease (CKD), stage III (moderate) 11/04/2011  . Benign essential HTN 02/09/2009  . Premature contractions, supraventricular 12/15/2008  . Pure hypercholesterolemia 10/08/2006   Past Medical History:  Diagnosis Date  . Acid reflux 08/03/2015  . Anxiety 08/03/2015  . Avitaminosis D 05/18/2012  . Benign essential HTN 02/09/2009   Overview:  Benign Essential Hypertension   . Benign prostatic hyperplasia with urinary obstruction 12/07/2013  . Chronic kidney disease (CKD), stage III (moderate) 11/04/2011  . CN (constipation) 08/03/2015  . Cognitive changes 08/07/2015  . Degenerative arthritis of hip 05/14/2013  . Degenerative arthritis of spine 05/14/2013  . Degenerative disorder of muscle 08/03/2015  . Dysrhythmia 2001   ablation for a-flutter  . H/O atrial flutter 08/03/2015   Overview:  Pacemaker placed 2015   . High blood pressure   . High cholesterol   . History of pacemaker 2016  . Hx of cardiac cath 2013  . Loss of balance 08/07/2015  . Lumbar canal stenosis 07/07/2013   Overview:  Lumbar laminectomy 07/07/13 - Dr. Lurene Shadow   . Lumbar scoliosis 05/14/2013  . Macular degeneration 08/07/2015  . Premature contractions, supraventricular 12/15/2008   Overview:  Atrial Premature Complex   . Presence of permanent cardiac pacemaker   . Tremor 08/07/2015  . Urinary frequency 08/07/2015    No family history on file.  Past Surgical History:  Procedure Laterality Date  . ATRIAL FLUTTER ABLATION  2001  . CATARACT EXTRACTION Bilateral 2015  . CHOLECYSTECTOMY    . MASTOIDECTOMY  1934    lower back   . PACEMAKER PLACEMENT  2016  . SPINE SURGERY  2016  . TONSILLECTOMY  1934  . TRIGGER FINGER RELEASE Left 12/14/2015   Procedure: RELEASE TRIGGER FINGER/A-1 PULLEY ring finger left;  Surgeon: Daryll Brod, MD;  Location: [facility];  Service: Orthopedics;  Laterality: Left;  FAB   Social History   Occupational History  . Not on file.   Social History Main Topics  . Smoking status: Former Smoker    Years: 50.00    Types: Cigarettes    Quit date: 08/03/1975  . Smokeless tobacco: Never Used     Comment: smoked 6 cig dialy   . Alcohol use 1.8 oz/week    3 Standard drinks or equivalent per week     Comment: 3 times a week  . Drug use: No  . Sexual activity: Not on file

## 2016-10-10 DIAGNOSIS — M6281 Muscle weakness (generalized): Secondary | ICD-10-CM | POA: Diagnosis not present

## 2016-10-10 DIAGNOSIS — R2681 Unsteadiness on feet: Secondary | ICD-10-CM | POA: Diagnosis not present

## 2016-10-10 DIAGNOSIS — F419 Anxiety disorder, unspecified: Secondary | ICD-10-CM | POA: Diagnosis not present

## 2016-10-10 DIAGNOSIS — I739 Peripheral vascular disease, unspecified: Secondary | ICD-10-CM | POA: Diagnosis not present

## 2016-10-10 DIAGNOSIS — K219 Gastro-esophageal reflux disease without esophagitis: Secondary | ICD-10-CM | POA: Diagnosis not present

## 2016-10-10 DIAGNOSIS — M545 Low back pain: Secondary | ICD-10-CM | POA: Diagnosis not present

## 2016-10-11 DIAGNOSIS — K219 Gastro-esophageal reflux disease without esophagitis: Secondary | ICD-10-CM | POA: Diagnosis not present

## 2016-10-11 DIAGNOSIS — M545 Low back pain: Secondary | ICD-10-CM | POA: Diagnosis not present

## 2016-10-11 DIAGNOSIS — F419 Anxiety disorder, unspecified: Secondary | ICD-10-CM | POA: Diagnosis not present

## 2016-10-11 DIAGNOSIS — I739 Peripheral vascular disease, unspecified: Secondary | ICD-10-CM | POA: Diagnosis not present

## 2016-10-11 DIAGNOSIS — R2681 Unsteadiness on feet: Secondary | ICD-10-CM | POA: Diagnosis not present

## 2016-10-11 DIAGNOSIS — M6281 Muscle weakness (generalized): Secondary | ICD-10-CM | POA: Diagnosis not present

## 2016-10-16 ENCOUNTER — Ambulatory Visit
Admission: RE | Admit: 2016-10-16 | Discharge: 2016-10-16 | Disposition: A | Discharge status: Home / Self Care | Payer: Medicare Other | Source: Ambulatory Visit | Attending: Orthopaedic Surgery | Admitting: Orthopaedic Surgery

## 2016-10-16 DIAGNOSIS — M5127 Other intervertebral disc displacement, lumbosacral region: Secondary | ICD-10-CM | POA: Diagnosis not present

## 2016-10-16 DIAGNOSIS — M48062 Spinal stenosis, lumbar region with neurogenic claudication: Secondary | ICD-10-CM

## 2016-10-16 DIAGNOSIS — M419 Scoliosis, unspecified: Secondary | ICD-10-CM

## 2016-10-17 DIAGNOSIS — K219 Gastro-esophageal reflux disease without esophagitis: Secondary | ICD-10-CM | POA: Diagnosis not present

## 2016-10-17 DIAGNOSIS — I739 Peripheral vascular disease, unspecified: Secondary | ICD-10-CM | POA: Diagnosis not present

## 2016-10-17 DIAGNOSIS — M6281 Muscle weakness (generalized): Secondary | ICD-10-CM | POA: Diagnosis not present

## 2016-10-17 DIAGNOSIS — M545 Low back pain: Secondary | ICD-10-CM | POA: Diagnosis not present

## 2016-10-17 DIAGNOSIS — R2681 Unsteadiness on feet: Secondary | ICD-10-CM | POA: Diagnosis not present

## 2016-10-17 DIAGNOSIS — F419 Anxiety disorder, unspecified: Secondary | ICD-10-CM | POA: Diagnosis not present

## 2016-10-18 DIAGNOSIS — F419 Anxiety disorder, unspecified: Secondary | ICD-10-CM | POA: Diagnosis not present

## 2016-10-18 DIAGNOSIS — M6281 Muscle weakness (generalized): Secondary | ICD-10-CM | POA: Diagnosis not present

## 2016-10-18 DIAGNOSIS — R2681 Unsteadiness on feet: Secondary | ICD-10-CM | POA: Diagnosis not present

## 2016-10-18 DIAGNOSIS — K219 Gastro-esophageal reflux disease without esophagitis: Secondary | ICD-10-CM | POA: Diagnosis not present

## 2016-10-18 DIAGNOSIS — I739 Peripheral vascular disease, unspecified: Secondary | ICD-10-CM | POA: Diagnosis not present

## 2016-10-18 DIAGNOSIS — M545 Low back pain: Secondary | ICD-10-CM | POA: Diagnosis not present

## 2016-10-21 DIAGNOSIS — K219 Gastro-esophageal reflux disease without esophagitis: Secondary | ICD-10-CM | POA: Diagnosis not present

## 2016-10-21 DIAGNOSIS — R2681 Unsteadiness on feet: Secondary | ICD-10-CM | POA: Diagnosis not present

## 2016-10-21 DIAGNOSIS — M6281 Muscle weakness (generalized): Secondary | ICD-10-CM | POA: Diagnosis not present

## 2016-10-21 DIAGNOSIS — I739 Peripheral vascular disease, unspecified: Secondary | ICD-10-CM | POA: Diagnosis not present

## 2016-10-21 DIAGNOSIS — M545 Low back pain: Secondary | ICD-10-CM | POA: Diagnosis not present

## 2016-10-21 DIAGNOSIS — F419 Anxiety disorder, unspecified: Secondary | ICD-10-CM | POA: Diagnosis not present

## 2016-10-23 DIAGNOSIS — I739 Peripheral vascular disease, unspecified: Secondary | ICD-10-CM | POA: Diagnosis not present

## 2016-10-23 DIAGNOSIS — R2681 Unsteadiness on feet: Secondary | ICD-10-CM | POA: Diagnosis not present

## 2016-10-23 DIAGNOSIS — M6281 Muscle weakness (generalized): Secondary | ICD-10-CM | POA: Diagnosis not present

## 2016-10-23 DIAGNOSIS — M545 Low back pain: Secondary | ICD-10-CM | POA: Diagnosis not present

## 2016-10-23 DIAGNOSIS — F419 Anxiety disorder, unspecified: Secondary | ICD-10-CM | POA: Diagnosis not present

## 2016-10-23 DIAGNOSIS — K219 Gastro-esophageal reflux disease without esophagitis: Secondary | ICD-10-CM | POA: Diagnosis not present

## 2016-10-25 DIAGNOSIS — K219 Gastro-esophageal reflux disease without esophagitis: Secondary | ICD-10-CM | POA: Diagnosis not present

## 2016-10-25 DIAGNOSIS — F419 Anxiety disorder, unspecified: Secondary | ICD-10-CM | POA: Diagnosis not present

## 2016-10-25 DIAGNOSIS — M545 Low back pain: Secondary | ICD-10-CM | POA: Diagnosis not present

## 2016-10-25 DIAGNOSIS — I739 Peripheral vascular disease, unspecified: Secondary | ICD-10-CM | POA: Diagnosis not present

## 2016-10-25 DIAGNOSIS — R2681 Unsteadiness on feet: Secondary | ICD-10-CM | POA: Diagnosis not present

## 2016-10-25 DIAGNOSIS — M6281 Muscle weakness (generalized): Secondary | ICD-10-CM | POA: Diagnosis not present

## 2016-10-28 DIAGNOSIS — I4891 Unspecified atrial fibrillation: Secondary | ICD-10-CM | POA: Diagnosis not present

## 2016-10-28 DIAGNOSIS — R2681 Unsteadiness on feet: Secondary | ICD-10-CM | POA: Diagnosis not present

## 2016-10-28 DIAGNOSIS — K219 Gastro-esophageal reflux disease without esophagitis: Secondary | ICD-10-CM | POA: Diagnosis not present

## 2016-10-28 DIAGNOSIS — M545 Low back pain: Secondary | ICD-10-CM | POA: Diagnosis not present

## 2016-10-28 DIAGNOSIS — M6281 Muscle weakness (generalized): Secondary | ICD-10-CM | POA: Diagnosis not present

## 2016-10-28 DIAGNOSIS — F419 Anxiety disorder, unspecified: Secondary | ICD-10-CM | POA: Diagnosis not present

## 2016-10-28 DIAGNOSIS — Z9181 History of falling: Secondary | ICD-10-CM | POA: Diagnosis not present

## 2016-10-29 ENCOUNTER — Ambulatory Visit (INDEPENDENT_AMBULATORY_CARE_PROVIDER_SITE_OTHER): Payer: Medicare Other | Admitting: Orthopaedic Surgery

## 2016-10-29 ENCOUNTER — Encounter (INDEPENDENT_AMBULATORY_CARE_PROVIDER_SITE_OTHER): Payer: Self-pay | Admitting: Orthopaedic Surgery

## 2016-10-29 VITALS — BP 145/67 | HR 65 | Ht 69.0 in | Wt 160.0 lb

## 2016-10-29 DIAGNOSIS — M48062 Spinal stenosis, lumbar region with neurogenic claudication: Secondary | ICD-10-CM

## 2016-10-29 NOTE — Progress Notes (Signed)
Office Visit Note   Patient: Stanley Hunter           Date of Birth: 02-27-1924           MRN: [id number] Visit Date: 10/29/2016              Requested by: Blanchie Serve, MD [address] [place], [place] [address] PCP: Blanchie Serve, MD   Assessment & Plan: Visit Diagnoses:  1. Spinal stenosis of lumbar region with neurogenic claudication     Plan: Patient's having primarily neurogenic claudication symptoms. His CT scan shows lateral recess stenosis at L4-5 on the left. He did not get any improvement from his previous procedure on the right side at L4-5. He gets relief with sitting. I discussed the lamina progressive the point where he is not able to walk and it's worthwhile to consider reevaluation. At present I recommend a single epidural seepage of some improvement. The severe lateral recess stenosis that he has is not accompanied by radicular symptoms and as such I'm not sure that flow recess decompression very likely to give him significant improvement.  Follow-Up Instructions: Return if symptoms worsen or fail to improve.   Orders:  Orders Placed This Encounter  Procedures  . Ambulatory referral to Physical Medicine Rehab   No orders of the defined types were placed in this encounter.     Procedures: No procedures performed   Clinical Data: No additional findings.   Subjective: Chief Complaint  Patient presents with  . Results    CT lumber    HPI 83 year old male returns with persistent back pain. Previous surgery on the right at L4-5. He continues to have back pain and feels that the surgery had he feels really did not help his pain. He is ambulatory. He has increased pain after prolonged standing and walking. Pain increases after 5-10 minutes of being upright. No bowel or bladder symptoms no fever no chills. Patient's had a plain CT scan and is available for review. CT scan shows moderate to severe left L4-5 lateral recess stenosis and mild to moderate central  stenosis and previous laminectomy without evidence of discitis. Advanced disc degeneration at L4-5. With this advancing scoliosis and multilevel disc degeneration he understands that further decompression can be associated with progression of instability. Hopefully the injection will give him some relief.  Review of Systems 14 point review of systems is updated unchanged from 10/09/2016 office note. Of note is the scoliosis stage III kidney disease history of pacemaker prostate hypertrophy and previous right L4-5 laminectomy.   Objective: Vital Signs: BP (!) 145/67   Pulse 65   Ht 5\' 9"  (1.753 m)   Wt 160 lb (72.6 kg)   BMI 23.63 kg/m   Physical Exam  Constitutional: He is oriented to person, place, and time. He appears well-developed and well-nourished.  HENT:  Head: Normocephalic and atraumatic.  Eyes: Pupils are equal, round, and reactive to light. EOM are normal.  Neck: No tracheal deviation present. No thyromegaly present.  Cardiovascular: Normal rate.   Pulmonary/Chest: Effort normal. He has no wheezes.  Abdominal: Soft. Bowel sounds are normal.  Neurological: He is alert and oriented to person, place, and time.  Skin: Skin is warm and dry. Capillary refill takes less than 2 seconds.  Psychiatric: He has a normal mood and affect. His behavior is normal. Judgment and thought content normal.    Ortho Exam patient's able ambulate without a limp. No weakness of L5 or S1 right or left side. Slight  edema in lower extremities has good palpable posterior tibial pulses both right and left. Foot is warm nor evidence or findings that would suggest arterial insufficiency. Mild sciatic notch tenderness worse on the left than right. Mild discomfort with limitation of hip rotation 30 on the left and 20 on the right but this does not really reproduce his typical pain. Well-healed lumbar incision at L4-5 fusion millimeters to the right of midline. No tenderness and no erythema of the incision no  drainage.  Specialty Comments:  No specialty comments available.  Imaging: Study Result   CLINICAL DATA:  83 year old male with lumbar back pain, left leg numbness, spasms. Scoliosis, prior fusion.  EXAM: CT LUMBAR SPINE WITHOUT CONTRAST  TECHNIQUE: Multidetector CT imaging of the lumbar spine was performed without intravenous contrast administration. Multiplanar CT image reconstructions were also generated.  COMPARISON:  Lumbar radiographs 09/25/2016. Chest radiographs 04/21/2016.  FINDINGS: Segmentation: Normal.  Alignment: Stable from the recent lumbar radiographs. Moderate dextroconvex lumbar scoliosis. Straightening of lumbar lordosis, mild reversal of lordosis in the upper lumbar spine.  Vertebrae: Solid interbody ankylosis or arthrodesis at L1-L2. Diffuse bulky lower thoracic and lumbar endplate osteophytosis. Advanced lower lumbar facet degeneration. No acute osseous abnormality identified. Visible sacrum and SI joints intact.  Paraspinal and other soft tissues: Aortoiliac calcified atherosclerosis. Negative visualized abdominal viscera. Diverticulosis in the distal colon. Partially visible urinary bladder distension. Negative paraspinal soft tissues.  Disc levels: T11-T12: Relatively preserved disc height but vacuum disc. Anterior eccentric disc osteophyte complex. Suggestion of biforaminal, but no spinal stenosis.  T12-L1: Diffuse vacuum disc. Right eccentric circumferential disc osteophyte complex. Mild to moderate right T12 foraminal stenosis but no significant spinal stenosis.  L1-L2: Solid interbody fusion with left eccentric disc osteophyte complex. Mild to moderate left lateral recess stenosis (descending left L2 nerve root level), with no significant spinal stenosis. Mild to moderate osseous left L1 foraminal stenosis.  L2-L3: Complete disc space loss with vacuum disc. Bulky circumferential disc osteophyte complex, broad-based  posterior component. No significant spinal stenosis. Mild to moderate lateral recess stenosis greater on the left. Mild left greater than right L2 foraminal stenosis.  L3-L4: Complete disc space loss. Vacuum disc. Bulky circumferential disc osteophyte complex with broad-based posterior component. No significant spinal stenosis. Possible mild to moderate bilateral lateral recess stenosis. Mild to moderate left and mild right L3 foraminal stenosis.  L4-L5: Complete disc space loss. Vacuum disc. Bulky circumferential disc osteophyte complex. Broad-based posterior component. Previous right laminectomy. Moderate to severe facet hypertrophy with vacuum phenomena. Left ligament flavum hypertrophy or less likely synovial cyst (series 3, image 57). Moderate to severe lateral recess stenosis suspected greater on the left. Mild to moderate spinal stenosis. Moderate to severe left and mild to moderate right L4 foraminal stenosis.  L5-S1: Relatively preserved disc space. Mild circumferential disc bulge and endplate spurring. Mild to moderate facet hypertrophy. No significant stenosis.  IMPRESSION: 1. No acute osseous abnormality identified. In the lumbar spine. Dextroconvex scoliosis and solid interbody fusion at L1-L2. 2. Previous right laminectomy at L4-L5. Advanced disc, endplate, and posterior element degeneration at that level with mild to moderate spinal, moderate to severe lateral recess and left neural foraminal stenosis. Query left L4 and/or L5 radiculitis. 3. Advanced lumbar disc and endplate degeneration elsewhere. No other significant spinal stenosis. Possible mild to moderate lateral recess stenosis on the left at L2-L3 and bilaterally at L3-L4. Mild to moderate left neural foraminal stenosis at the L1 through L3 nerve levels. 4.  Aortic Atherosclerosis (ICD10-I70.0).   Electronically  Signed   By: Genevie Ann M.D.   On: 10/16/2016 10:04       PMFS History: Patient  Active Problem List   Diagnosis Date Noted  . Sinusitis 07/25/2016  . Lipoma 05/09/2016  . Influenza B 04/21/2016  . Edema 02/01/2016  . Pain 11/24/2015  . Trigger ring finger of left hand 11/16/2015  . Tremor 08/07/2015  . Loss of balance 08/07/2015  . Macular degeneration 08/07/2015  . Urinary frequency 08/07/2015  . Cognitive changes 08/07/2015  . History of pacemaker   . Anxiety 08/03/2015  . Constipation 08/03/2015  . Acid reflux 08/03/2015  . H/O atrial flutter 08/03/2015  . Myalgia 08/03/2015  . Benign prostatic hyperplasia with urinary obstruction 12/07/2013  . Sick sinus syndrome ([place]) 11/18/2013  . Spinal stenosis of lumbar region with neurogenic claudication 07/07/2013  . Degenerative arthritis of hip 05/14/2013  . Degenerative arthritis of spine 05/14/2013  . Lumbar scoliosis 05/14/2013  . Avitaminosis D 05/18/2012  . Chronic kidney disease (CKD), stage III (moderate) 11/04/2011  . Benign essential HTN 02/09/2009  . Premature contractions, supraventricular 12/15/2008  . Pure hypercholesterolemia 10/08/2006   Past Medical History:  Diagnosis Date  . Acid reflux 08/03/2015  . Anxiety 08/03/2015  . Avitaminosis D 05/18/2012  . Benign essential HTN 02/09/2009   Overview:  Benign Essential Hypertension   . Benign prostatic hyperplasia with urinary obstruction 12/07/2013  . Chronic kidney disease (CKD), stage III (moderate) 11/04/2011  . CN (constipation) 08/03/2015  . Cognitive changes 08/07/2015  . Degenerative arthritis of hip 05/14/2013  . Degenerative arthritis of spine 05/14/2013  . Degenerative disorder of muscle 08/03/2015  . Dysrhythmia 2001   ablation for a-flutter  . H/O atrial flutter 08/03/2015   Overview:  Pacemaker placed 2015   . High blood pressure   . High cholesterol   . History of pacemaker 2016  . Hx of cardiac cath 2013  . Loss of balance 08/07/2015  . Lumbar canal stenosis 07/07/2013   Overview:  Lumbar laminectomy 07/07/13 - Dr. Lurene Shadow   . Lumbar  scoliosis 05/14/2013  . Macular degeneration 08/07/2015  . Premature contractions, supraventricular 12/15/2008   Overview:  Atrial Premature Complex   . Presence of permanent cardiac pacemaker   . Tremor 08/07/2015  . Urinary frequency 08/07/2015    No family history on file.  Past Surgical History:  Procedure Laterality Date  . ATRIAL FLUTTER ABLATION  2001  . CATARACT EXTRACTION Bilateral 2015  . CHOLECYSTECTOMY    . MASTOIDECTOMY  1934   lower back   . PACEMAKER PLACEMENT  2016  . SPINE SURGERY  2016  . TONSILLECTOMY  1934  . TRIGGER FINGER RELEASE Left 12/14/2015   Procedure: RELEASE TRIGGER FINGER/A-1 PULLEY ring finger left;  Surgeon: Daryll Brod, MD;  Location: [facility];  Service: Orthopedics;  Laterality: Left;  FAB   Social History   Occupational History  . Not on file.   Social History Main Topics  . Smoking status: Former Smoker    Years: 50.00    Types: Cigarettes    Quit date: 08/03/1975  . Smokeless tobacco: Never Used     Comment: smoked 6 cig dialy   . Alcohol use 1.8 oz/week    3 Standard drinks or equivalent per week     Comment: 3 times a week  . Drug use: No  . Sexual activity: Not on file

## 2016-10-30 DIAGNOSIS — F419 Anxiety disorder, unspecified: Secondary | ICD-10-CM | POA: Diagnosis not present

## 2016-10-30 DIAGNOSIS — K219 Gastro-esophageal reflux disease without esophagitis: Secondary | ICD-10-CM | POA: Diagnosis not present

## 2016-10-30 DIAGNOSIS — M545 Low back pain: Secondary | ICD-10-CM | POA: Diagnosis not present

## 2016-10-30 DIAGNOSIS — R2681 Unsteadiness on feet: Secondary | ICD-10-CM | POA: Diagnosis not present

## 2016-10-30 DIAGNOSIS — Z9181 History of falling: Secondary | ICD-10-CM | POA: Diagnosis not present

## 2016-10-30 DIAGNOSIS — M6281 Muscle weakness (generalized): Secondary | ICD-10-CM | POA: Diagnosis not present

## 2016-11-01 DIAGNOSIS — R2681 Unsteadiness on feet: Secondary | ICD-10-CM | POA: Diagnosis not present

## 2016-11-01 DIAGNOSIS — Z9181 History of falling: Secondary | ICD-10-CM | POA: Diagnosis not present

## 2016-11-01 DIAGNOSIS — M545 Low back pain: Secondary | ICD-10-CM | POA: Diagnosis not present

## 2016-11-01 DIAGNOSIS — M6281 Muscle weakness (generalized): Secondary | ICD-10-CM | POA: Diagnosis not present

## 2016-11-01 DIAGNOSIS — K219 Gastro-esophageal reflux disease without esophagitis: Secondary | ICD-10-CM | POA: Diagnosis not present

## 2016-11-01 DIAGNOSIS — F419 Anxiety disorder, unspecified: Secondary | ICD-10-CM | POA: Diagnosis not present

## 2016-11-02 ENCOUNTER — Other Ambulatory Visit: Payer: Self-pay | Admitting: Internal Medicine

## 2016-11-04 DIAGNOSIS — Z9181 History of falling: Secondary | ICD-10-CM | POA: Diagnosis not present

## 2016-11-04 DIAGNOSIS — R2681 Unsteadiness on feet: Secondary | ICD-10-CM | POA: Diagnosis not present

## 2016-11-04 DIAGNOSIS — M545 Low back pain: Secondary | ICD-10-CM | POA: Diagnosis not present

## 2016-11-04 DIAGNOSIS — M6281 Muscle weakness (generalized): Secondary | ICD-10-CM | POA: Diagnosis not present

## 2016-11-04 DIAGNOSIS — F419 Anxiety disorder, unspecified: Secondary | ICD-10-CM | POA: Diagnosis not present

## 2016-11-04 DIAGNOSIS — K219 Gastro-esophageal reflux disease without esophagitis: Secondary | ICD-10-CM | POA: Diagnosis not present

## 2016-11-06 DIAGNOSIS — Z9181 History of falling: Secondary | ICD-10-CM | POA: Diagnosis not present

## 2016-11-06 DIAGNOSIS — M6281 Muscle weakness (generalized): Secondary | ICD-10-CM | POA: Diagnosis not present

## 2016-11-06 DIAGNOSIS — K219 Gastro-esophageal reflux disease without esophagitis: Secondary | ICD-10-CM | POA: Diagnosis not present

## 2016-11-06 DIAGNOSIS — M545 Low back pain: Secondary | ICD-10-CM | POA: Diagnosis not present

## 2016-11-06 DIAGNOSIS — F419 Anxiety disorder, unspecified: Secondary | ICD-10-CM | POA: Diagnosis not present

## 2016-11-06 DIAGNOSIS — R2681 Unsteadiness on feet: Secondary | ICD-10-CM | POA: Diagnosis not present

## 2016-11-07 DIAGNOSIS — K219 Gastro-esophageal reflux disease without esophagitis: Secondary | ICD-10-CM | POA: Diagnosis not present

## 2016-11-07 DIAGNOSIS — R2681 Unsteadiness on feet: Secondary | ICD-10-CM | POA: Diagnosis not present

## 2016-11-07 DIAGNOSIS — M6281 Muscle weakness (generalized): Secondary | ICD-10-CM | POA: Diagnosis not present

## 2016-11-07 DIAGNOSIS — Z9181 History of falling: Secondary | ICD-10-CM | POA: Diagnosis not present

## 2016-11-07 DIAGNOSIS — M545 Low back pain: Secondary | ICD-10-CM | POA: Diagnosis not present

## 2016-11-07 DIAGNOSIS — F419 Anxiety disorder, unspecified: Secondary | ICD-10-CM | POA: Diagnosis not present

## 2016-11-11 DIAGNOSIS — H04123 Dry eye syndrome of bilateral lacrimal glands: Secondary | ICD-10-CM | POA: Diagnosis not present

## 2016-11-11 DIAGNOSIS — H353132 Nonexudative age-related macular degeneration, bilateral, intermediate dry stage: Secondary | ICD-10-CM | POA: Diagnosis not present

## 2016-11-11 DIAGNOSIS — Z9181 History of falling: Secondary | ICD-10-CM | POA: Diagnosis not present

## 2016-11-11 DIAGNOSIS — H353211 Exudative age-related macular degeneration, right eye, with active choroidal neovascularization: Secondary | ICD-10-CM | POA: Diagnosis not present

## 2016-11-11 DIAGNOSIS — F419 Anxiety disorder, unspecified: Secondary | ICD-10-CM | POA: Diagnosis not present

## 2016-11-11 DIAGNOSIS — K219 Gastro-esophageal reflux disease without esophagitis: Secondary | ICD-10-CM | POA: Diagnosis not present

## 2016-11-11 DIAGNOSIS — M6281 Muscle weakness (generalized): Secondary | ICD-10-CM | POA: Diagnosis not present

## 2016-11-11 DIAGNOSIS — M545 Low back pain: Secondary | ICD-10-CM | POA: Diagnosis not present

## 2016-11-11 DIAGNOSIS — R2681 Unsteadiness on feet: Secondary | ICD-10-CM | POA: Diagnosis not present

## 2016-11-11 DIAGNOSIS — H35371 Puckering of macula, right eye: Secondary | ICD-10-CM | POA: Diagnosis not present

## 2016-11-12 ENCOUNTER — Ambulatory Visit (INDEPENDENT_AMBULATORY_CARE_PROVIDER_SITE_OTHER): Payer: Self-pay

## 2016-11-12 ENCOUNTER — Ambulatory Visit (INDEPENDENT_AMBULATORY_CARE_PROVIDER_SITE_OTHER): Payer: Medicare Other | Admitting: Physical Medicine and Rehabilitation

## 2016-11-12 VITALS — BP 151/66 | HR 67

## 2016-11-12 DIAGNOSIS — M5416 Radiculopathy, lumbar region: Secondary | ICD-10-CM | POA: Diagnosis not present

## 2016-11-12 DIAGNOSIS — M961 Postlaminectomy syndrome, not elsewhere classified: Secondary | ICD-10-CM

## 2016-11-12 MED ORDER — LIDOCAINE HCL (PF) 1 % IJ SOLN
2.0000 mL | Freq: Once | INTRAMUSCULAR | Status: AC
  Administered 2016-11-12: 2 mL

## 2016-11-12 MED ORDER — BETAMETHASONE SOD PHOS & ACET 6 (3-3) MG/ML IJ SUSP
12.0000 mg | Freq: Once | INTRAMUSCULAR | Status: AC
  Administered 2016-11-12: 12 mg

## 2016-11-12 NOTE — Patient Instructions (Signed)

## 2016-11-12 NOTE — Progress Notes (Deleted)
Lower back pain. Worse on left side. Mostly with standing and walking. Spasms on left side. Some tingling in both legs at times.

## 2016-11-13 DIAGNOSIS — M545 Low back pain: Secondary | ICD-10-CM | POA: Diagnosis not present

## 2016-11-13 DIAGNOSIS — Z9181 History of falling: Secondary | ICD-10-CM | POA: Diagnosis not present

## 2016-11-13 DIAGNOSIS — F419 Anxiety disorder, unspecified: Secondary | ICD-10-CM | POA: Diagnosis not present

## 2016-11-13 DIAGNOSIS — R2681 Unsteadiness on feet: Secondary | ICD-10-CM | POA: Diagnosis not present

## 2016-11-13 DIAGNOSIS — K219 Gastro-esophageal reflux disease without esophagitis: Secondary | ICD-10-CM | POA: Diagnosis not present

## 2016-11-13 DIAGNOSIS — M6281 Muscle weakness (generalized): Secondary | ICD-10-CM | POA: Diagnosis not present

## 2016-11-15 DIAGNOSIS — Z9181 History of falling: Secondary | ICD-10-CM | POA: Diagnosis not present

## 2016-11-15 DIAGNOSIS — R2681 Unsteadiness on feet: Secondary | ICD-10-CM | POA: Diagnosis not present

## 2016-11-15 DIAGNOSIS — F419 Anxiety disorder, unspecified: Secondary | ICD-10-CM | POA: Diagnosis not present

## 2016-11-15 DIAGNOSIS — M6281 Muscle weakness (generalized): Secondary | ICD-10-CM | POA: Diagnosis not present

## 2016-11-15 DIAGNOSIS — M545 Low back pain: Secondary | ICD-10-CM | POA: Diagnosis not present

## 2016-11-15 DIAGNOSIS — K219 Gastro-esophageal reflux disease without esophagitis: Secondary | ICD-10-CM | POA: Diagnosis not present

## 2016-11-18 DIAGNOSIS — F419 Anxiety disorder, unspecified: Secondary | ICD-10-CM | POA: Diagnosis not present

## 2016-11-18 DIAGNOSIS — Z9181 History of falling: Secondary | ICD-10-CM | POA: Diagnosis not present

## 2016-11-18 DIAGNOSIS — R2681 Unsteadiness on feet: Secondary | ICD-10-CM | POA: Diagnosis not present

## 2016-11-18 DIAGNOSIS — M6281 Muscle weakness (generalized): Secondary | ICD-10-CM | POA: Diagnosis not present

## 2016-11-18 DIAGNOSIS — K219 Gastro-esophageal reflux disease without esophagitis: Secondary | ICD-10-CM | POA: Diagnosis not present

## 2016-11-18 DIAGNOSIS — M545 Low back pain: Secondary | ICD-10-CM | POA: Diagnosis not present

## 2016-11-18 NOTE — Procedures (Signed)
Stanley Hunter is a pleasant 83 year old gentleman with prior lumbar surgery. He has been seen and evaluated by Dr. Lorin Mercy who request a left L4 transforaminal epidural steroid injection. Images were reviewed. Patient complains of lower back pain worse on the left side mainly with standing and walking. He does get spasms on the left and some tingling in both legs. His pain could also be facet joint mediated. The injection  will be diagnostic and hopefully therapeutic. Thse patient has failed conservative care including time, medications and activity modification.  Lumbosacral Transforaminal Epidural Steroid Injection - Sub-Pedicular Approach with Fluoroscopic Guidance  Patient: Stanley Hunter      Date of Birth: [birth year] MRN: [id number] PCP: Blanchie Serve, MD      Visit Date: 11/12/2016   Universal Protocol:    Date/Time: 11/12/2016  Consent Given By: the patient  Position: PRONE  Additional Comments: Vital signs were monitored before and after the procedure. Patient was prepped and draped in the usual sterile fashion. The correct patient, procedure, and site was verified.   Injection Procedure Details:  Procedure Site One Meds Administered:  Meds ordered this encounter  Medications  . lidocaine (PF) (XYLOCAINE) 1 % injection 2 mL  . betamethasone acetate-betamethasone sodium phosphate (CELESTONE) injection 12 mg    Laterality: Left  Location/Site:  L4-L5  Needle size: 22 G  Needle type: Spinal  Needle Placement: Transforaminal  Findings:  -Contrast Used: 1 mL iohexol 180 mg iodine/mL   -Comments: Excellent flow of contrast along the nerve and into the epidural space.  Procedure Details: After squaring off the end-plates to get a true AP view, the C-arm was positioned so that an oblique view of the foramen as noted above was visualized. The target area is just inferior to the "nose of the scotty dog" or sub pedicular. The soft tissues overlying this structure were infiltrated  with 2-3 ml. of 1% Lidocaine without Epinephrine.  The spinal needle was inserted toward the target using a "trajectory" view along the fluoroscope beam.  Under AP and lateral visualization, the needle was advanced so it did not puncture dura and was located close the 6 O'Clock position of the pedical in AP tracterory. Biplanar projections were used to confirm position. Aspiration was confirmed to be negative for CSF and/or blood. A 1-2 ml. volume of Isovue-250 was injected and flow of contrast was noted at each level. Radiographs were obtained for documentation purposes.   After attaining the desired flow of contrast documented above, a 0.5 to 1.0 ml test dose of 0.25% Marcaine was injected into each respective transforaminal space.  The patient was observed for 90 seconds post injection.  After no sensory deficits were reported, and normal lower extremity motor function was noted,   the above injectate was administered so that equal amounts of the injectate were placed at each foramen (level) into the transforaminal epidural space.   Additional Comments:  The patient tolerated the procedure well Dressing: Band-Aid    Post-procedure details: Patient was observed during the procedure. Post-procedure instructions were reviewed.  Patient left the clinic in stable condition.

## 2016-12-02 DIAGNOSIS — H353132 Nonexudative age-related macular degeneration, bilateral, intermediate dry stage: Secondary | ICD-10-CM | POA: Diagnosis not present

## 2016-12-02 DIAGNOSIS — H35371 Puckering of macula, right eye: Secondary | ICD-10-CM | POA: Diagnosis not present

## 2016-12-02 DIAGNOSIS — H43813 Vitreous degeneration, bilateral: Secondary | ICD-10-CM | POA: Diagnosis not present

## 2016-12-04 DIAGNOSIS — L570 Actinic keratosis: Secondary | ICD-10-CM | POA: Diagnosis not present

## 2016-12-04 DIAGNOSIS — Z23 Encounter for immunization: Secondary | ICD-10-CM | POA: Diagnosis not present

## 2016-12-04 DIAGNOSIS — D2271 Melanocytic nevi of right lower limb, including hip: Secondary | ICD-10-CM | POA: Diagnosis not present

## 2016-12-09 DIAGNOSIS — I495 Sick sinus syndrome: Secondary | ICD-10-CM | POA: Diagnosis not present

## 2016-12-09 DIAGNOSIS — Z950 Presence of cardiac pacemaker: Secondary | ICD-10-CM | POA: Diagnosis not present

## 2017-01-03 ENCOUNTER — Other Ambulatory Visit: Payer: Self-pay

## 2017-01-03 DIAGNOSIS — I10 Essential (primary) hypertension: Secondary | ICD-10-CM

## 2017-01-03 DIAGNOSIS — N183 Chronic kidney disease, stage 3 unspecified: Secondary | ICD-10-CM

## 2017-01-09 DIAGNOSIS — N183 Chronic kidney disease, stage 3 (moderate): Secondary | ICD-10-CM | POA: Diagnosis not present

## 2017-01-09 DIAGNOSIS — I10 Essential (primary) hypertension: Secondary | ICD-10-CM | POA: Diagnosis not present

## 2017-01-09 LAB — BASIC METABOLIC PANEL
BUN/Creatinine Ratio: 22 (calc) (ref 6–22)
BUN: 34 mg/dL — ABNORMAL HIGH (ref 7–25)
CO2: 27 mmol/L (ref 20–32)
Calcium: 9.2 mg/dL (ref 8.6–10.3)
Chloride: 104 mmol/L (ref 98–110)
Creat: 1.58 mg/dL — ABNORMAL HIGH (ref 0.70–1.11)
Glucose, Bld: 112 mg/dL — ABNORMAL HIGH (ref 65–99)
Potassium: 4.4 mmol/L (ref 3.5–5.3)
Sodium: 140 mmol/L (ref 135–146)

## 2017-01-14 ENCOUNTER — Non-Acute Institutional Stay: Payer: Medicare Other | Admitting: Internal Medicine

## 2017-01-14 ENCOUNTER — Encounter: Payer: Self-pay | Admitting: Internal Medicine

## 2017-01-14 VITALS — BP 134/68 | HR 67 | Temp 97.7°F | Resp 16 | Ht 69.0 in | Wt 162.0 lb

## 2017-01-14 DIAGNOSIS — K219 Gastro-esophageal reflux disease without esophagitis: Secondary | ICD-10-CM | POA: Diagnosis not present

## 2017-01-14 DIAGNOSIS — R739 Hyperglycemia, unspecified: Secondary | ICD-10-CM

## 2017-01-14 DIAGNOSIS — R002 Palpitations: Secondary | ICD-10-CM | POA: Diagnosis not present

## 2017-01-14 DIAGNOSIS — E785 Hyperlipidemia, unspecified: Secondary | ICD-10-CM

## 2017-01-14 DIAGNOSIS — N183 Chronic kidney disease, stage 3 unspecified: Secondary | ICD-10-CM

## 2017-01-14 DIAGNOSIS — I495 Sick sinus syndrome: Secondary | ICD-10-CM | POA: Diagnosis not present

## 2017-01-14 DIAGNOSIS — R4189 Other symptoms and signs involving cognitive functions and awareness: Secondary | ICD-10-CM

## 2017-01-14 DIAGNOSIS — D638 Anemia in other chronic diseases classified elsewhere: Secondary | ICD-10-CM | POA: Insufficient documentation

## 2017-01-14 DIAGNOSIS — I1310 Hypertensive heart and chronic kidney disease without heart failure, with stage 1 through stage 4 chronic kidney disease, or unspecified chronic kidney disease: Secondary | ICD-10-CM | POA: Diagnosis not present

## 2017-01-14 MED ORDER — PANTOPRAZOLE SODIUM 20 MG PO TBEC: 20.0000 mg | DELAYED_RELEASE_TABLET | Freq: Every day | ORAL | 3 refills | Status: DC

## 2017-01-14 NOTE — Patient Instructions (Addendum)
Your pantoprazole has been changed to 20 mg daily from 40 mg daily. Take the new dose please.  Do not move around without your walker.   Your memory test shows that you have some memory loss. I have ordered few tests for further workup. We will contact you with the result.  Dementia Dementia is the loss of two or more brain functions, such as:  Memory.  Decision making.  Behavior.  Speaking.  Thinking.  Problem solving.  There are many types of dementia. The most common type is called progressive dementia. Progressive dementia gets worse with time and it is irreversible. An example of this type of dementia is Alzheimer disease. What are the causes? This condition may be caused by:  Nerve cell damage in the brain.  Genetic mutations.  Certain medicines.  Multiple small strokes.  An infection, such as chronic meningitis.  A metabolic problem, such as vitamin B12 deficiency or thyroid disease.  Pressure on the brain, such as from a tumor or blood clot.  What are the signs or symptoms? Symptoms of this condition include:  Sudden changes in mood.  Depression.  Problems with balance.  Changes in personality.  Poor short-term memory.  Agitation.  Delusions.  Hallucinations.  Having a hard time: ? Speaking thoughts. ? Finding words. ? Solving problems. ? Doing familiar tasks. ? Understanding familiar ideas.  How is this diagnosed? This condition is diagnosed with an assessment by your health care provider. During this assessment, your health care provider will talk with you and your family, friends, or caregivers about your symptoms. A thorough medical history will be taken, and you will have a physical exam and tests. Tests may include:  Lab tests, such as blood or urine tests.  Imaging tests, such as a CT scan, PET scan, or MRI.  A lumbar puncture. This test involves removing and testing a small amount of the fluid that surrounds the brain and  spinal cord.  An electroencephalogram (EEG). In this test, small metal discs are used to measure electrical activity in the brain.  Memory tests, cognitive tests, and neuropsychological tests. These tests evaluate brain function.  How is this treated? Treatment depends on the cause of the dementia. It may involve taking medicines that may help:  To control the dementia.  To slow down the disease.  To manage symptoms.  In some cases, treating the cause of the dementia can improve symptoms, reverse symptoms, or slow down how quickly the dementia gets worse. Your health care provider can help direct you to support groups, organizations, and other health care providers who can help with decisions about your care. Follow these instructions at home: Medicine  Take over-the-counter and prescription medicines only as told by your health care provider.  Avoid taking medicines that can affect thinking, such as pain or sleeping medicines. Lifestyle   Make healthy lifestyle choices: ? Be physically active as told by your health care provider. ? Do not use any tobacco products, such as cigarettes, chewing tobacco, and e-cigarettes. If you need help quitting, ask your health care provider. ? Eat a healthy diet. ? Practice stress-management techniques when you get stressed. ? Stay social.  Drink enough fluid to keep your urine clear or pale yellow.  Make sure to get quality sleep. These tips can help you to get a good night's rest: ? Avoid napping during the day. ? Keep your sleeping area dark and cool. ? Avoid exercising during the few hours before you go to bed. ?  Avoid caffeine products in the evening. General instructions  Work with your health care provider to determine what you need help with and what your safety needs are.  If you were given a bracelet that tracks your location, make sure to wear it.  Keep all follow-up visits as told by your health care provider. This is  important. Contact a health care provider if:  You have any new symptoms.  You have problems with choking or swallowing.  You have any symptoms of a different illness. Get help right away if:  You develop a fever.  You have new or worsening confusion.  You have new or worsening sleepiness.  You have a hard time staying awake.  You or your family members become concerned for your safety. This information is not intended to replace advice given to you by your health care provider. Make sure you discuss any questions you have with your health care provider. Document Released: 08/07/2000 Document Revised: 06/22/2015 Document Reviewed: 11/09/2014 Elsevier Interactive Patient Education  2017 [employer].

## 2017-01-14 NOTE — Progress Notes (Signed)
[facility]  Provider: Blanchie Serve MD   Location:  [facility] Service:  Clinic (12)  PCP: Blanchie Serve, MD Patient Care Team: Blanchie Serve, MD as PCP - General (Internal Medicine) Mast, Man X, NP as Nurse Practitioner (Internal Medicine)  Extended Emergency Contact Information Primary Emergency Contact: Shackleford,[name] Address: [address]          [place],  [address] [place]nette Litter of [place] Phone: [phone] Relation: Spouse  Code Status: DNR  Goals of Care: Advanced Directive information Advanced Directives 07/25/2016  Does Patient Have a Medical Advance Directive? Yes  Type of Paramedic of [place];Living will;Out of facility DNR (pink MOST or yellow form)  Does patient want to make changes to medical advance directive? No - Patient declined  Copy of [place] in Chart? Yes  Pre-existing out of facility DNR order (yellow form or pink MOST form) Yellow form placed in chart (order not valid for inpatient use)      Chief Complaint  Patient presents with  . Medical Management of Chronic Issues    6 month follow up. No concerns at this time.  . Medication Refill    No refills needed at this time.   Marland Kitchen Results    Discuss labs  . MMSE    27/30, passed the clock drawing    HPI: Patient is a 83 y.o. male seen today for routine visit.   Lumbar radiculopathy- worsens with movement. Followed by orthopedic. Note from 10/29/16 and 11/12/16 reviewed. S/p epidural injection 9/18 without relief. Does back exercises. Takes naproxen daily as needed and robaxin daily as needed. Has not required his muscle relaxant. No fall since last visit.   Allergic rhinitis- has PND and needs to clear his throat. No cough, sinus pain or pressure. Nasal congestion present that clears as day progressed. Currently on claritin 10 mg daily.   gerd- pantoprazole 40 mg daily has been helpful. Denies any  reflux symptom.  Hyperlipidemia- taking atrovastatin 40 mg daily  HTN- currently on amlodipine 10 mg daily. Denies headache, chest pain or dyspnea.   Sick sinus syndrome- has pacemaker since 2015 and is followed by cardiology. Occasional palpitation. Currently on propafenone.   Cognitive impairment- pt acknowledges being forgetful with names and dates. Pt resides in independent living apartment with his wife. Gets his routines mixed up at times.    Past Medical History:  Diagnosis Date  . Acid reflux 08/03/2015  . Anxiety 08/03/2015  . Avitaminosis D 05/18/2012  . Benign essential HTN 02/09/2009   Overview:  Benign Essential Hypertension   . Benign prostatic hyperplasia with urinary obstruction 12/07/2013  . Chronic kidney disease (CKD), stage III (moderate) ([place]) 11/04/2011  . CN (constipation) 08/03/2015  . Cognitive changes 08/07/2015  . Degenerative arthritis of hip 05/14/2013  . Degenerative arthritis of spine 05/14/2013  . Degenerative disorder of muscle 08/03/2015  . Dysrhythmia 2001   ablation for a-flutter  . H/O atrial flutter 08/03/2015   Overview:  Pacemaker placed 2015   . High blood pressure   . High cholesterol   . History of pacemaker 2016  . Hx of cardiac cath 2013  . Loss of balance 08/07/2015  . Lumbar canal stenosis 07/07/2013   Overview:  Lumbar laminectomy 07/07/13 - Dr. Lurene Shadow   . Lumbar scoliosis 05/14/2013  . Macular degeneration 08/07/2015  . Premature contractions, supraventricular 12/15/2008   Overview:  Atrial Premature Complex   .  Presence of permanent cardiac pacemaker   . Tremor 08/07/2015  . Urinary frequency 08/07/2015   Past Surgical History:  Procedure Laterality Date  . ATRIAL FLUTTER ABLATION  2001  . CATARACT EXTRACTION Bilateral 2015  . CHOLECYSTECTOMY    . MASTOIDECTOMY  1934   lower back   . PACEMAKER PLACEMENT  2016  . RELEASE TRIGGER FINGER/A-1 PULLEY ring finger left Left 12/14/2015   Performed by Daryll Brod, [facility] at [facility]   . SPINE SURGERY  2016  . TONSILLECTOMY  1934    reports that he quit smoking about 41 years ago. His smoking use included cigarettes. He quit after 50.00 years of use. he has never used smokeless tobacco. He reports that he drinks about 1.8 oz of alcohol per week. He reports that he does not use drugs. Social History   Socioeconomic History  . Marital status: Married    Spouse name: Not on file  . Number of children: Not on file  . Years of education: Not on file  . Highest education level: Not on file  Social Needs  . Financial resource strain: Not on file  . Food insecurity - worry: Not on file  . Food insecurity - inability: Not on file  . Transportation needs - medical: Not on file  . Transportation needs - non-medical: Not on file  Occupational History  . Not on file  Tobacco Use  . Smoking status: Former Smoker    Years: 50.00    Types: Cigarettes    Last attempt to quit: 08/03/1975    Years since quitting: 41.4  . Smokeless tobacco: Never Used  . Tobacco comment: smoked 6 cig dialy   Substance and Sexual Activity  . Alcohol use: Yes    Alcohol/week: 1.8 oz    Types: 3 Standard drinks or equivalent per week    Comment: 3 times a week  . Drug use: No  . Sexual activity: Not on file  Other Topics Concern  . Not on file  Social History Narrative   Lives at [facility]  Since 4 /16/2016 with wife [name]   Diet: n/a   Caffeine: Yes   Married: yes, 1957   House: Yes, 2 persons   Pets: no pets    Current/Past profession: Optometrist    Exercise: No   Living Will: Yes   DNR: Yes   POA/HPOA: Yes   Walks with walker    Functional Status Survey:    History reviewed. No pertinent family history.  Health Maintenance  Topic Date Due  . TETANUS/TDAP  02/25/2017 (Originally 02/25/2006)  . PNA vac Low Risk Adult (1 of 2 - PCV13) 02/25/2017 (Originally 07/31/1991)  . INFLUENZA VACCINE  Completed    Allergies  Allergen Reactions  . Cortisone Other (See Comments)    . Neomycin Rash  . Sulfamethoxazole-Trimethoprim Other (See Comments)    Leg weakness    Outpatient Encounter Medications as of 01/14/2017  Medication Sig  . ALPRAZolam (XANAX) 0.5 MG tablet Take 0.5 mg by mouth as needed.   Marland Kitchen amLODipine (NORVASC) 10 MG tablet TAKE 1 TABLET DAILY  . aspirin EC 81 MG tablet Take 81 mg by mouth daily.  Marland Kitchen atorvastatin (LIPITOR) 40 MG tablet TAKE 1 TABLET DAILY  . loratadine (CLARITIN) 10 MG tablet Take 10 mg by mouth daily.  . magnesium hydroxide (MILK OF MAGNESIA) 800 MG/5ML suspension Take 30 mLs by mouth as needed for constipation.  . methocarbamol (ROBAXIN) 500 MG tablet Take 1,000  mg by mouth daily as needed for muscle spasms.  . Multiple Vitamins-Minerals (PRESERVISION AREDS) CAPS Take 2 capsules by mouth daily. 2 by mouth daily   . naproxen (NAPROSYN) 500 MG tablet Take 1,000 mg by mouth daily as needed.  . pantoprazole (PROTONIX) 40 MG tablet Take 1 tablet (40 mg total) by mouth daily.  . propafenone (RYTHMOL) 150 MG tablet Take 225 mg by mouth 4 (four) times daily. 1.5 tablets 4 times daily breakfast, lunch, dinner and at bedtime   . tamsulosin (FLOMAX) 0.4 MG CAPS capsule Take 1 capsule (0.4 mg total) by mouth at bedtime.  Marland Kitchen VITAMIN D, ERGOCALCIFEROL, PO Take 1 tablet by mouth daily. 2,000 units once daily   No facility-administered encounter medications on file as of 01/14/2017.     Review of Systems  Constitutional: Negative for appetite change, chills, fatigue and fever.  HENT: Positive for congestion and postnasal drip. Negative for ear pain, hearing loss, mouth sores, sinus pressure, sinus pain, sore throat and trouble swallowing.   Eyes: Positive for visual disturbance. Negative for redness and itching.       Has corrective glasses  Respiratory: Negative for cough, shortness of breath and wheezing.   Cardiovascular: Positive for palpitations and leg swelling. Negative for chest pain.  Gastrointestinal: Positive for constipation. Negative  for abdominal pain, blood in stool, diarrhea, nausea and vomiting.  Genitourinary: Positive for frequency. Negative for dysuria and hematuria.  Musculoskeletal: Positive for arthralgias, back pain and gait problem.       No recent fall. Uses a walker to ambulate  Skin: Negative for rash and wound.  Neurological: Positive for dizziness. Negative for headaches.       Some dizziness present with change of position  Psychiatric/Behavioral: Positive for confusion. Negative for behavioral problems and dysphoric mood. The patient is nervous/anxious.     Vitals:   01/14/17 0841  BP: 134/68  Pulse: 67  Resp: 16  Temp: 97.7 F (36.5 C)  TempSrc: Oral  SpO2: 95%  Weight: 162 lb (73.5 kg)  Height: 5' 9"  (1.753 m)   Body mass index is 23.92 kg/m.   Wt Readings from Last 3 Encounters:  01/14/17 162 lb (73.5 kg)  10/29/16 160 lb (72.6 kg)  10/09/16 165 lb (74.8 kg)   Physical Exam  Constitutional: He is oriented to person, place, and time. He appears well-developed and well-nourished. No distress.  HENT:  Head: Normocephalic and atraumatic.  Nose: Nose normal.  Mouth/Throat: Oropharynx is clear and moist. No oropharyngeal exudate.  Impacted cerumen to both ears  Eyes: Conjunctivae are normal. Pupils are equal, round, and reactive to light. Right eye exhibits no discharge. Left eye exhibits no discharge.  Corrective glasses  Neck: Normal range of motion. Neck supple.  Cardiovascular: Normal rate and regular rhythm.  Pacemaker to left chest wall  Pulmonary/Chest: Effort normal and breath sounds normal. No respiratory distress. He has no wheezes. He has no rales.  Abdominal: Soft. Bowel sounds are normal. There is no tenderness. There is no guarding.  Musculoskeletal: He exhibits edema.  Arthritis changes to fingers, unsteady gait, uses a walker, 1+ leg edmea  Lymphadenopathy:    He has no cervical adenopathy.  Neurological: He is alert and oriented to person, place, and time.    11/120/18 MMSE 27/30, passed clock draw  Skin: Skin is warm and dry. No rash noted. He is not diaphoretic.  Psychiatric: He has a normal mood and affect.    Labs reviewed: Basic Metabolic Panel: Recent Labs  04/22/16 0417 07/08/16 0811 01/09/17 0715  NA 138 140 140  K 3.9 4.5 4.4  CL 108 105 104  CO2 25 27 27   GLUCOSE 93 90 112*  BUN 21* 35* 34*  CREATININE 1.48* 1.71* 1.58*  CALCIUM 8.1* 8.5* 9.2   Liver Function Tests: Recent Labs    01/22/16 0001 01/23/16 07/08/16 0811  AST 21 21 18   ALT 17 17 16   ALKPHOS 69 69 72  BILITOT 0.7  --  0.4  PROT 6.9  --  6.6  ALBUMIN 3.9  --  3.8   No results for input(s): LIPASE, AMYLASE in the last 8760 hours. No results for input(s): AMMONIA in the last 8760 hours. CBC: Recent Labs    04/21/16 1859  WBC 6.6  NEUTROABS 4.5  HGB 11.9*  HCT 34.7*  MCV 91.3  PLT 140*   Cardiac Enzymes: No results for input(s): CKTOTAL, CKMB, CKMBINDEX, TROPONINI in the last 8760 hours. BNP: Invalid input(s): POCBNP No results found for: HGBA1C No results found for: TSH No results found for: VITAMINB12 No results found for: FOLATE No results found for: IRON, TIBC, FERRITIN  Lipid Panel: Recent Labs    01/22/16 0001 01/23/16  CHOL 151 151  HDL 78 78*  LDLCALC 64 64  TRIG 44 44  CHOLHDL 1.9  --    No results found for: HGBA1C  Procedures since last visit: No results found.  Assessment/Plan  1. Hypertensive heart and renal disease with renal failure, stage 1 through stage 4 or unspecified chronic kidney disease, without heart failure Continue amlodipine 10 mg daily.  - CMP with eGFR; Future - Lipid Panel; Future - CBC with Differential/Platelets; Future  2. Sick sinus syndrome [facility]) Has a pacemaker. Continue propafenone. Followed by cardiology service.   3. Palpitation Has pacemaker. Check thyroid function.  - TSH; Future  4. Gastroesophageal reflux disease without esophagitis Controlled symptom. Decrease  pantoprazole to 20 mg daily from 40 mg daily and monitor.  - pantoprazole (PROTONIX) 20 MG tablet; Take 1 tablet (20 mg total) daily by mouth.  Dispense: 90 tablet; Refill: 3 - CBC with Differential/Platelets; Future  5. Hyperlipidemia, unspecified hyperlipidemia type Check lipid panel. Continue atorvastatin 40 mg daily.  - Lipid Panel; Future  6. Hyperglycemia Elevated fasting blood sugar. Check a1c with his age, history of hypertension and HLD.  - CMP with eGFR; Future - Hemoglobin A1c; Future  7. CKD (chronic kidney disease) stage 3, GFR 30-59 ml/min (HCC) Monitor BMP.  - CMP with eGFR; Future - Hemoglobin A1c; Future  8. Cognitive impairment Check TSH and b12. Reviewed MMSE. Supportive care for now as needed. - Vitamin B12; Future  9. Anemia of chronic disease With history of ckd 3. Monitor cbc periodically.  - Vitamin B12; Future   Labs/tests ordered:  As above  Next appointment: 3 months for physical  Communication: reviewed care plan with patient and charge nurse.    Blanchie Serve, MD Internal Medicine [facility] [address] [place], [place] [address] Cell Phone (Monday-Friday 8 am - 5 pm): [phone] On Call: [phone] and follow prompts after 5 pm and on weekends Office Phone: [phone] Office Fax: [phone]

## 2017-01-28 ENCOUNTER — Non-Acute Institutional Stay: Payer: Medicare Other

## 2017-01-28 VITALS — BP 122/60 | HR 74 | Temp 97.5°F | Ht 69.0 in | Wt 162.0 lb

## 2017-01-28 DIAGNOSIS — Z0000 Encounter for general adult medical examination without abnormal findings: Secondary | ICD-10-CM

## 2017-01-28 NOTE — Patient Instructions (Signed)
Mr. Stanley Hunter , Thank you for taking time to come for your Medicare Wellness Visit. I appreciate your ongoing commitment to your health goals. Please review the following plan we discussed and let me know if I can assist you in the future.   Screening recommendations/referrals: Colonoscopy excluded, you are over age 81 Recommended yearly ophthalmology/optometry visit for glaucoma screening and checkup Recommended yearly dental visit for hygiene and checkup  Vaccinations: Influenza vaccine up to date. Due 2019 fall season Pneumococcal vaccine due, declined Tdap vaccine due, declined Shingles vaccine due, declined    Advanced directives: In Chart  Conditions/risks identified: None  Next appointment: Dr. Bubba Camp 04/15/2017 @ 10am  Preventive Care 50 Years and Older, Male Preventive care refers to lifestyle choices and visits with your health care provider that can promote health and wellness. What does preventive care include?  A yearly physical exam. This is also called an annual well check.  Dental exams once or twice a year.  Routine eye exams. Ask your health care provider how often you should have your eyes checked.  Personal lifestyle choices, including:  Daily care of your teeth and gums.  Regular physical activity.  Eating a healthy diet.  Avoiding tobacco and drug use.  Limiting alcohol use.  Practicing safe sex.  Taking low doses of aspirin every day.  Taking vitamin and mineral supplements as recommended by your health care provider. What happens during an annual well check? The services and screenings done by your health care provider during your annual well check will depend on your age, overall health, lifestyle risk factors, and family history of disease. Counseling  Your health care provider may ask you questions about your:  Alcohol use.  Tobacco use.  Drug use.  Emotional well-being.  Home and relationship well-being.  Sexual activity.  Eating  habits.  History of falls.  Memory and ability to understand (cognition).  Work and work Statistician. Screening  You may have the following tests or measurements:  Height, weight, and BMI.  Blood pressure.  Lipid and cholesterol levels. These may be checked every 5 years, or more frequently if you are over 41 years old.  Skin check.  Lung cancer screening. You may have this screening every year starting at age 62 if you have a 30-pack-year history of smoking and currently smoke or have quit within the past 15 years.  Fecal occult blood test (FOBT) of the stool. You may have this test every year starting at age 69.  Flexible sigmoidoscopy or colonoscopy. You may have a sigmoidoscopy every 5 years or a colonoscopy every 10 years starting at age 46.  Prostate cancer screening. Recommendations will vary depending on your family history and other risks.  Hepatitis C blood test.  Hepatitis B blood test.  Sexually transmitted disease (STD) testing.  Diabetes screening. This is done by checking your blood sugar (glucose) after you have not eaten for a while (fasting). You may have this done every 1-3 years.  Abdominal aortic aneurysm (AAA) screening. You may need this if you are a current or former smoker.  Osteoporosis. You may be screened starting at age 33 if you are at high risk. Talk with your health care provider about your test results, treatment options, and if necessary, the need for more tests. Vaccines  Your health care provider may recommend certain vaccines, such as:  Influenza vaccine. This is recommended every year.  Tetanus, diphtheria, and acellular pertussis (Tdap, Td) vaccine. You may need a Td booster every 10  years.  Zoster vaccine. You may need this after age 94.  Pneumococcal 13-valent conjugate (PCV13) vaccine. One dose is recommended after age 76.  Pneumococcal polysaccharide (PPSV23) vaccine. One dose is recommended after age 64. Talk to your health  care provider about which screenings and vaccines you need and how often you need them. This information is not intended to replace advice given to you by your health care provider. Make sure you discuss any questions you have with your health care provider. Document Released: 03/10/2015 Document Revised: 11/01/2015 Document Reviewed: 12/13/2014 Elsevier Interactive Patient Education  2017 [place] Prevention in the Home Falls can cause injuries. They can happen to people of all ages. There are many things you can do to make your home safe and to help prevent falls. What can I do on the outside of my home?  Regularly fix the edges of walkways and driveways and fix any cracks.  Remove anything that might make you trip as you walk through a door, such as a raised step or threshold.  Trim any bushes or trees on the path to your home.  Use bright outdoor lighting.  Clear any walking paths of anything that might make someone trip, such as rocks or tools.  Regularly check to see if handrails are loose or broken. Make sure that both sides of any steps have handrails.  Any raised decks and porches should have guardrails on the edges.  Have any leaves, snow, or ice cleared regularly.  Use sand or salt on walking paths during winter.  Clean up any spills in your garage right away. This includes oil or grease spills. What can I do in the bathroom?  Use night lights.  Install grab bars by the toilet and in the tub and shower. Do not use towel bars as grab bars.  Use non-skid mats or decals in the tub or shower.  If you need to sit down in the shower, use a plastic, non-slip stool.  Keep the floor dry. Clean up any water that spills on the floor as soon as it happens.  Remove soap buildup in the tub or shower regularly.  Attach bath mats securely with double-sided non-slip rug tape.  Do not have throw rugs and other things on the floor that can make you trip. What can I do  in the bedroom?  Use night lights.  Make sure that you have a light by your bed that is easy to reach.  Do not use any sheets or blankets that are too big for your bed. They should not hang down onto the floor.  Have a firm chair that has side arms. You can use this for support while you get dressed.  Do not have throw rugs and other things on the floor that can make you trip. What can I do in the kitchen?  Clean up any spills right away.  Avoid walking on wet floors.  Keep items that you use a lot in easy-to-reach places.  If you need to reach something above you, use a strong step stool that has a grab bar.  Keep electrical cords out of the way.  Do not use floor polish or wax that makes floors slippery. If you must use wax, use non-skid floor wax.  Do not have throw rugs and other things on the floor that can make you trip. What can I do with my stairs?  Do not leave any items on the stairs.  Make sure that  there are handrails on both sides of the stairs and use them. Fix handrails that are broken or loose. Make sure that handrails are as long as the stairways.  Check any carpeting to make sure that it is firmly attached to the stairs. Fix any carpet that is loose or worn.  Avoid having throw rugs at the top or bottom of the stairs. If you do have throw rugs, attach them to the floor with carpet tape.  Make sure that you have a light switch at the top of the stairs and the bottom of the stairs. If you do not have them, ask someone to add them for you. What else can I do to help prevent falls?  Wear shoes that:  Do not have high heels.  Have rubber bottoms.  Are comfortable and fit you well.  Are closed at the toe. Do not wear sandals.  If you use a stepladder:  Make sure that it is fully opened. Do not climb a closed stepladder.  Make sure that both sides of the stepladder are locked into place.  Ask someone to hold it for you, if possible.  Clearly mark  and make sure that you can see:  Any grab bars or handrails.  First and last steps.  Where the edge of each step is.  Use tools that help you move around (mobility aids) if they are needed. These include:  Canes.  Walkers.  Scooters.  Crutches.  Turn on the lights when you go into a dark area. Replace any light bulbs as soon as they burn out.  Set up your furniture so you have a clear path. Avoid moving your furniture around.  If any of your floors are uneven, fix them.  If there are any pets around you, be aware of where they are.  Review your medicines with your doctor. Some medicines can make you feel dizzy. This can increase your chance of falling. Ask your doctor what other things that you can do to help prevent falls. This information is not intended to replace advice given to you by your health care provider. Make sure you discuss any questions you have with your health care provider. Document Released: 12/08/2008 Document Revised: 07/20/2015 Document Reviewed: 03/18/2014 Elsevier Interactive Patient Education  2017 [employer].

## 2017-01-28 NOTE — Progress Notes (Signed)
Subjective:   Stanley Hunter is a 83 y.o. male who presents for Medicare Annual/Subsequent preventive examination at [facility] 06/2015       Objective:    Vitals: BP 122/60 (BP Location: Right Arm, Patient Position: Sitting)   Pulse 74   Temp (!) 97.5 F (36.4 C) (Oral)   Ht 5\' 9"  (1.753 m)   Wt 162 lb (73.5 kg)   SpO2 94%   BMI 23.92 kg/m   Body mass index is 23.92 kg/m.  Advanced Directives 01/28/2017 07/25/2016 05/09/2016 05/02/2016 04/29/2016 04/22/2016 04/22/2016  Does Patient Have a Medical Advance Directive? Yes Yes Yes Yes Yes - Yes  Type of Advance Directive [place];Living will;Out of facility DNR (pink MOST or yellow form) [place];Living will;Out of facility DNR (pink MOST or yellow form) [place];Living will;Out of facility DNR (pink MOST or yellow form) [place];Living will;Out of facility DNR (pink MOST or yellow form) [place];Living will;Out of facility DNR (pink MOST or yellow form) [place];Living will [place];Living will  Does patient want to make changes to medical advance directive? No - Patient declined No - Patient declined - - - No - Patient declined -  Copy of [place] in Chart? Yes Yes Yes Yes Yes - -  Pre-existing out of facility DNR order (yellow form or pink MOST form) Yellow form placed in chart (order not valid for inpatient use);Pink MOST form placed in chart (order not valid for inpatient use) Yellow form placed in chart (order not valid for inpatient use) Yellow form placed in chart (order not valid for inpatient use) Yellow form placed in chart (order not valid for inpatient use) Yellow form placed in chart (order not valid for inpatient use) - -    Tobacco Social History   Tobacco Use  Smoking Status Former Smoker  . Years: 50.00  . Types:  Cigarettes  . Last attempt to quit: 08/03/1975  . Years since quitting: 41.5  Smokeless Tobacco Never Used  Tobacco Comment   smoked 6 cig dialy      Counseling given: Not Answered Comment: smoked 6 cig dialy    Clinical Intake:  Pre-visit preparation completed: No  Pain : No/denies pain Interpreter Needed?: No  Information entered by :: Rich Reining, RN  Past Medical History:  Diagnosis Date  . Acid reflux 08/03/2015  . Anxiety 08/03/2015  . Avitaminosis D 05/18/2012  . Benign essential HTN 02/09/2009   Overview:  Benign Essential Hypertension   . Benign prostatic hyperplasia with urinary obstruction 12/07/2013  . Chronic kidney disease (CKD), stage III (moderate) ([place]) 11/04/2011  . CN (constipation) 08/03/2015  . Cognitive changes 08/07/2015  . Degenerative arthritis of hip 05/14/2013  . Degenerative arthritis of spine 05/14/2013  . Degenerative disorder of muscle 08/03/2015  . Dysrhythmia 2001   ablation for a-flutter  . H/O atrial flutter 08/03/2015   Overview:  Pacemaker placed 2015   . High blood pressure   . High cholesterol   . History of pacemaker 2016  . Hx of cardiac cath 2013  . Loss of balance 08/07/2015  . Lumbar canal stenosis 07/07/2013   Overview:  Lumbar laminectomy 07/07/13 - Dr. Lurene Shadow   . Lumbar scoliosis 05/14/2013  . Macular degeneration 08/07/2015  . Premature contractions, supraventricular 12/15/2008   Overview:  Atrial Premature Complex   . Presence of permanent cardiac pacemaker   .  Tremor 08/07/2015  . Urinary frequency 08/07/2015   Past Surgical History:  Procedure Laterality Date  . ATRIAL FLUTTER ABLATION  2001  . CATARACT EXTRACTION Bilateral 2015  . CHOLECYSTECTOMY    . MASTOIDECTOMY  1934   lower back   . PACEMAKER PLACEMENT  2016  . SPINE SURGERY  2016  . TONSILLECTOMY  1934  . TRIGGER FINGER RELEASE Left 12/14/2015   Procedure: RELEASE TRIGGER FINGER/A-1 PULLEY ring finger left;  Surgeon: Daryll Brod, MD;  Location: [facility];  Service: Orthopedics;  Laterality: Left;  FAB   History reviewed. No pertinent family history. Social History   Socioeconomic History  . Marital status: Married    Spouse name: None  . Number of children: None  . Years of education: None  . Highest education level: None  Social Needs  . Financial resource strain: Not hard at all  . Food insecurity - worry: Never true  . Food insecurity - inability: Never true  . Transportation needs - medical: No  . Transportation needs - non-medical: No  Occupational History  . None  Tobacco Use  . Smoking status: Former Smoker    Years: 50.00    Types: Cigarettes    Last attempt to quit: 08/03/1975    Years since quitting: 41.5  . Smokeless tobacco: Never Used  . Tobacco comment: smoked 6 cig dialy   Substance and Sexual Activity  . Alcohol use: Yes    Alcohol/week: 1.8 oz    Types: 3 Standard drinks or equivalent per week    Comment: 3 times a week  . Drug use: No  . Sexual activity: None  Other Topics Concern  . None  Social History Narrative   Lives at [facility]  Since 4 /16/2016 with wife Stanley Hunter   Diet: n/a   Caffeine: Yes   Married: yes, 1957   House: Yes, 2 persons   Pets: no pets    Current/Past profession: Accountant    Exercise: No   Living Will: Yes   DNR: Yes   POA/HPOA: Yes   Walks with walker    Outpatient Encounter Medications as of 01/28/2017  Medication Sig  . ALPRAZolam (XANAX) 0.5 MG tablet Take 0.5 mg by mouth as needed.   Marland Kitchen amLODipine (NORVASC) 10 MG tablet TAKE 1 TABLET DAILY  . aspirin EC 81 MG tablet Take 81 mg by mouth daily.  Marland Kitchen atorvastatin (LIPITOR) 40 MG tablet TAKE 1 TABLET DAILY  . loratadine (CLARITIN) 10 MG tablet Take 10 mg by mouth daily.  . magnesium hydroxide (MILK OF MAGNESIA) 800 MG/5ML suspension Take 30 mLs by mouth as needed for constipation.  . Multiple Vitamins-Minerals (PRESERVISION AREDS) CAPS Take 2 capsules by mouth daily. 2 by mouth daily   . naproxen (NAPROSYN)  500 MG tablet Take 1,000 mg by mouth daily as needed.  . pantoprazole (PROTONIX) 20 MG tablet Take 1 tablet (20 mg total) daily by mouth.  . propafenone (RYTHMOL) 150 MG tablet Take 225 mg by mouth 4 (four) times daily. 1.5 tablets 4 times daily breakfast, lunch, dinner and at bedtime   . tamsulosin (FLOMAX) 0.4 MG CAPS capsule Take 1 capsule (0.4 mg total) by mouth at bedtime.  Marland Kitchen VITAMIN D, ERGOCALCIFEROL, PO Take 1 tablet by mouth daily. 2,000 units once daily  . [DISCONTINUED] carbamide peroxide (DEBROX) 6.5 % OTIC solution Place 5 drops into both ears 2 (two) times daily. Stop date 01/21/17   No facility-administered encounter medications on file as of  01/28/2017.     Activities of Daily Living In your present state of health, do you have any difficulty performing the following activities: 01/28/2017 04/22/2016  Hearing? N -  Vision? N -  Difficulty concentrating or making decisions? Y -  Walking or climbing stairs? Y -  Comment - -  Dressing or bathing? N -  Doing errands, shopping? N N  Preparing Food and eating ? N -  Using the Toilet? N -  In the past six months, have you accidently leaked urine? N -  Do you have problems with loss of bowel control? N -  Managing your Medications? N -  Managing your Finances? N -  Housekeeping or managing your Housekeeping? N -  Some recent data might be hidden    Timed Get Up and Go Performed: 13 seconds, within normal limits  Patient Care Team: Blanchie Serve, MD as PCP - General (Internal Medicine) Mast, Man X, NP as Nurse Practitioner (Internal Medicine)   Assessment:     Exercise Activities and Dietary recommendations Current Exercise Habits: The patient does not participate in regular exercise at present, Exercise limited by: orthopedic condition(s)  Goals    . Maintain Lifestyle     Starting today pt will maintain lifestyle.       Fall Risk Fall Risk  01/28/2017 09/24/2016 08/03/2015  Falls in the past year? Yes Yes No    Number falls in past yr: 1 2 or more -  Injury with Fall? No No -  Risk Factor Category  - High Fall Risk -  Risk for fall due to : - Impaired balance/gait -  Follow up - Falls evaluation completed;Education provided;Falls prevention discussed;Follow up appointment -   Is the patient's home free of loose throw rugs in walkways, pet beds, electrical cords, etc?   yes      Grab bars in the bathroom? yes      Handrails on the stairs?   yes      Adequate lighting?   yes Depression Screen PHQ 2/9 Scores 01/28/2017 08/03/2015  PHQ - 2 Score 0 0    Cognitive Function MMSE - Mini Mental State Exam 01/14/2017 01/14/2017  Not completed: - (No Data)  Orientation to time 5 5  Orientation to Place 5 5  Registration 3 3  Attention/ Calculation 5 5  Recall 1 1  Language- name 2 objects 2 2  Language- repeat 1 1  Language- follow 3 step command 3 3  Language- read & follow direction 1 1  Write a sentence 1 1  Copy design 0 0  Total score 27 27        Immunization History  Administered Date(s) Administered  . Influenza, High Dose Seasonal PF 12/04/2016  . Td 02/26/1996   Screening Tests Health Maintenance  Topic Date Due  . TETANUS/TDAP  02/25/2017 (Originally 02/25/2006)  . PNA vac Low Risk Adult (1 of 2 - PCV13) 02/25/2017 (Originally 07/31/1991)  . INFLUENZA VACCINE  Completed   Cancer Screenings: Lung:  Low Dose CT Chest recommended if Age 49-80 years, 30 pack-year currently smoking OR have quit w/in 15years. Patient does not qualify. Breast:  Up to date on Mammogram? Yes  Up to date of Bone Density/Dexa? Yes Colorectal: up to date. Pt is over age 12  Additional Screenings: Hepatitis B/HIV/Syphillis:Declined Hepatitis C Screening: Declined    Plan:    I have personally reviewed and addressed the Medicare Annual Wellness questionnaire and have noted the following in the patient's chart:  A. Medical and social history B. Use of alcohol, tobacco or illicit drugs  C. Current  medications and supplements D. Functional ability and status E.  Nutritional status F.  Physical activity G. Advance directives H. List of other physicians I.  Hospitalizations, surgeries, and ER visits in previous 12 months J.  [place] to include hearing, vision, cognitive, depression L. Referrals and appointments - none  In addition, I have reviewed and discussed with patient certain preventive protocols, quality metrics, and best practice recommendations. A written personalized care plan for preventive services as well as general preventive health recommendations were provided to patient.  See attached scanned questionnaire for additional information.   Signed,   Rich Reining, RN Nurse Health Advisor   Quick Notes   Health Maintenance: Declined, PNA, shingles, and tetanus vaccines.     Abnormal Screen: MMSE on 01/14/2017-27/30     Patient Concerns: None     Nurse Concerns: None

## 2017-04-01 ENCOUNTER — Other Ambulatory Visit: Payer: Self-pay | Admitting: Internal Medicine

## 2017-04-02 DIAGNOSIS — I491 Atrial premature depolarization: Secondary | ICD-10-CM | POA: Diagnosis not present

## 2017-04-02 DIAGNOSIS — Z950 Presence of cardiac pacemaker: Secondary | ICD-10-CM | POA: Diagnosis not present

## 2017-04-02 DIAGNOSIS — I10 Essential (primary) hypertension: Secondary | ICD-10-CM | POA: Diagnosis not present

## 2017-04-02 DIAGNOSIS — Z45010 Encounter for checking and testing of cardiac pacemaker pulse generator [battery]: Secondary | ICD-10-CM | POA: Diagnosis not present

## 2017-04-02 DIAGNOSIS — I495 Sick sinus syndrome: Secondary | ICD-10-CM | POA: Diagnosis not present

## 2017-04-02 DIAGNOSIS — Z45018 Encounter for adjustment and management of other part of cardiac pacemaker: Secondary | ICD-10-CM | POA: Diagnosis not present

## 2017-04-08 DIAGNOSIS — I1310 Hypertensive heart and chronic kidney disease without heart failure, with stage 1 through stage 4 chronic kidney disease, or unspecified chronic kidney disease: Secondary | ICD-10-CM | POA: Diagnosis not present

## 2017-04-08 DIAGNOSIS — N183 Chronic kidney disease, stage 3 (moderate): Secondary | ICD-10-CM | POA: Diagnosis not present

## 2017-04-08 DIAGNOSIS — R002 Palpitations: Secondary | ICD-10-CM | POA: Diagnosis not present

## 2017-04-08 DIAGNOSIS — D638 Anemia in other chronic diseases classified elsewhere: Secondary | ICD-10-CM | POA: Diagnosis not present

## 2017-04-08 DIAGNOSIS — R4189 Other symptoms and signs involving cognitive functions and awareness: Secondary | ICD-10-CM | POA: Diagnosis not present

## 2017-04-08 DIAGNOSIS — E785 Hyperlipidemia, unspecified: Secondary | ICD-10-CM | POA: Diagnosis not present

## 2017-04-08 DIAGNOSIS — K219 Gastro-esophageal reflux disease without esophagitis: Secondary | ICD-10-CM | POA: Diagnosis not present

## 2017-04-08 DIAGNOSIS — R739 Hyperglycemia, unspecified: Secondary | ICD-10-CM | POA: Diagnosis not present

## 2017-04-09 LAB — COMPLETE METABOLIC PANEL WITH GFR
AG RATIO: 1.5 (calc) (ref 1.0–2.5)
ALKALINE PHOSPHATASE (APISO): 82 U/L (ref 40–115)
ALT: 18 U/L (ref 9–46)
AST: 20 U/L (ref 10–35)
Albumin: 4 g/dL (ref 3.6–5.1)
BILIRUBIN TOTAL: 0.5 mg/dL (ref 0.2–1.2)
BUN/Creatinine Ratio: 19 (calc) (ref 6–22)
BUN: 29 mg/dL — ABNORMAL HIGH (ref 7–25)
CHLORIDE: 106 mmol/L (ref 98–110)
CO2: 26 mmol/L (ref 20–32)
Calcium: 8.8 mg/dL (ref 8.6–10.3)
Creat: 1.56 mg/dL — ABNORMAL HIGH (ref 0.70–1.11)
GFR, EST AFRICAN AMERICAN: 45 mL/min/{1.73_m2} — AB (ref >=60)
GFR, Est Non African American: 39 mL/min/{1.73_m2} — ABNORMAL LOW (ref >=60)
Globulin: 2.7 g/dL (calc) (ref 1.9–3.7)
Glucose, Bld: 112 mg/dL — ABNORMAL HIGH (ref 65–99)
POTASSIUM: 4.2 mmol/L (ref 3.5–5.3)
Sodium: 140 mmol/L (ref 135–146)
TOTAL PROTEIN: 6.7 g/dL (ref 6.1–8.1)

## 2017-04-09 LAB — CBC WITH DIFFERENTIAL/PLATELET
Basophils Absolute: 30 {cells}/uL (ref 0–200)
Basophils Relative: 0.4 %
Eosinophils Absolute: 243 {cells}/uL (ref 15–500)
Eosinophils Relative: 3.2 %
HCT: 37.4 % — ABNORMAL LOW (ref 38.5–50.0)
Hemoglobin: 13.1 g/dL — ABNORMAL LOW (ref 13.2–17.1)
Lymphs Abs: 2120 {cells}/uL (ref 850–3900)
MCH: 31.5 pg (ref 27.0–33.0)
MCHC: 35 g/dL (ref 32.0–36.0)
MCV: 89.9 fL (ref 80.0–100.0)
MPV: 11.3 fL (ref 7.5–12.5)
Monocytes Relative: 12.8 %
Neutro Abs: 4233 {cells}/uL (ref 1500–7800)
Neutrophils Relative %: 55.7 %
Platelets: 218 10*3/uL (ref 140–400)
RBC: 4.16 Million/uL — ABNORMAL LOW (ref 4.20–5.80)
RDW: 15 % (ref 11.0–15.0)
Total Lymphocyte: 27.9 %
WBC mixed population: 973 {cells}/uL — ABNORMAL HIGH (ref 200–950)
WBC: 7.6 10*3/uL (ref 3.8–10.8)

## 2017-04-09 LAB — HEMOGLOBIN A1C
EAG (MMOL/L): 6.6 (calc)
Hgb A1c MFr Bld: 5.8 % of total Hgb — ABNORMAL HIGH (ref <5.7)
Mean Plasma Glucose: 120 (calc)

## 2017-04-09 LAB — LIPID PANEL
CHOL/HDL RATIO: 2.4 (calc) (ref <5.0)
Cholesterol: 146 mg/dL (ref <200)
HDL: 62 mg/dL (ref >40)
LDL CHOLESTEROL (CALC): 70 mg/dL
Non-HDL Cholesterol (Calc): 84 mg/dL (calc) (ref <130)
TRIGLYCERIDES: 67 mg/dL (ref <150)

## 2017-04-09 LAB — TSH: TSH: 2.83 m[IU]/L (ref 0.40–4.50)

## 2017-04-09 LAB — VITAMIN B12: Vitamin B-12: 819 pg/mL (ref 200–1100)

## 2017-04-15 ENCOUNTER — Ambulatory Visit: Payer: Medicare Other | Admitting: Internal Medicine

## 2017-04-15 ENCOUNTER — Encounter: Payer: Self-pay | Admitting: Internal Medicine

## 2017-04-15 VITALS — BP 118/70 | HR 60 | Temp 97.4°F | Resp 16 | Ht 69.0 in | Wt 172.8 lb

## 2017-04-15 DIAGNOSIS — R0982 Postnasal drip: Secondary | ICD-10-CM | POA: Diagnosis not present

## 2017-04-15 DIAGNOSIS — H6123 Impacted cerumen, bilateral: Secondary | ICD-10-CM

## 2017-04-15 DIAGNOSIS — N401 Enlarged prostate with lower urinary tract symptoms: Secondary | ICD-10-CM | POA: Diagnosis not present

## 2017-04-15 DIAGNOSIS — N138 Other obstructive and reflux uropathy: Secondary | ICD-10-CM

## 2017-04-15 DIAGNOSIS — N183 Chronic kidney disease, stage 3 unspecified: Secondary | ICD-10-CM

## 2017-04-15 DIAGNOSIS — I10 Essential (primary) hypertension: Secondary | ICD-10-CM | POA: Diagnosis not present

## 2017-04-15 DIAGNOSIS — M48062 Spinal stenosis, lumbar region with neurogenic claudication: Secondary | ICD-10-CM

## 2017-04-15 DIAGNOSIS — I495 Sick sinus syndrome: Secondary | ICD-10-CM

## 2017-04-15 DIAGNOSIS — Z0000 Encounter for general adult medical examination without abnormal findings: Secondary | ICD-10-CM

## 2017-04-15 DIAGNOSIS — K219 Gastro-esophageal reflux disease without esophagitis: Secondary | ICD-10-CM

## 2017-04-15 DIAGNOSIS — Z7189 Other specified counseling: Secondary | ICD-10-CM

## 2017-04-15 DIAGNOSIS — G3184 Mild cognitive impairment, so stated: Secondary | ICD-10-CM | POA: Diagnosis not present

## 2017-04-15 DIAGNOSIS — R7303 Prediabetes: Secondary | ICD-10-CM | POA: Diagnosis not present

## 2017-04-15 MED ORDER — CARBAMIDE PEROXIDE 6.5 % OT SOLN: 5.0000 [drp] | Freq: Two times a day (BID) | OTIC | 0 refills | Status: DC

## 2017-04-15 MED ORDER — MEDICAL COMPRESSION STOCKINGS MISC: 0 refills | Status: DC

## 2017-04-15 NOTE — Patient Instructions (Signed)
  Decrease protonix to 20 mg daily for now.  Put ear drops 5 drop to each ear twice a day for 1 week and come back to get you ears cleaned.

## 2017-04-15 NOTE — Progress Notes (Signed)
[facility]  Provider: Blanchie Serve MD   Location:      Place of Service:     PCP: Blanchie Serve, MD Patient Care Team: Blanchie Serve, MD as PCP - General (Internal Medicine) Mast, Man X, NP as Nurse Practitioner (Internal Medicine)  Extended Emergency Contact Information Primary Emergency Contact: Bayle,[name] Address: [address]          [place], [place] [address] Johnnette Litter of [place] Phone: [phone] Relation: Spouse  Code Status: full code  Goals of Care: Advanced Directive information Advanced Directives 04/15/2017  Does Patient Have a Medical Advance Directive? Yes  Type of Paramedic of [place];Living will  Does patient want to make changes to medical advance directive? Yes (MAU/Ambulatory/Procedural Areas - Information given)  Copy of [place] in Chart? Yes  Pre-existing out of facility DNR order (yellow form or pink MOST form) -      Chief Complaint  Patient presents with  . Annual Exam    physical, nasal congestion, weight gain, ear stuffiness  . Medication Refill    No refills needed at this time.   Marland Kitchen Results    Discuss labs    HPI: Patient is a 84 y.o. male seen today for annual physical exam.   Essential hypertension- takes norvasc 10 mg daily and ec baby aspirin. Abrupt Position change brings some dizziness.  Sick sinus syndrome- s/p pacemaker 2015. Follows with cardiology. On propafenone. He feels palpitations and skipped beats at times and they are annoying. Denies chest pain.   Hyperlipidemia- currently on atorvastatin 40 mg daily, tolerating it well.   Cognitive impairment- MMSE 27/30 last visit. Reviewed TSH and b12 level.   ckd stage 3- hydration maintained. Good urinary output.   GAD- on xanax 0.5 mg daily as needed, has not required it at all in last 1 month. Does not want this discontinued  gerd- taking pantoprazole 40 mg daily., dose lowered last visit to  20 mg daily but pt did not try lowering it. Denies any heartburn.   Hyperglycemia- with elevated blood sugar, a1c was ordered. a1c today 5.8.  Lumbar radiculopathy- currently on naproxen 1000 mg daily as needed. Has required it rarely and takes it with food. Also on vitamin d supplement. Of note, had epidural injection on 9/18 without relief. Followed by orthopedics.   Allergic rhinitis- off all medications. Has congestion to his sinuses and has PND, trouble clearing his throat.   BPH- taking flomax 0.4 mg daily. Denies new urinary complaints.    Past Medical History:  Diagnosis Date  . Acid reflux 08/03/2015  . Anxiety 08/03/2015  . Avitaminosis D 05/18/2012  . Benign essential HTN 02/09/2009   Overview:  Benign Essential Hypertension   . Benign prostatic hyperplasia with urinary obstruction 12/07/2013  . Chronic kidney disease (CKD), stage III (moderate) ([place]) 11/04/2011  . CN (constipation) 08/03/2015  . Cognitive changes 08/07/2015  . Degenerative arthritis of hip 05/14/2013  . Degenerative arthritis of spine 05/14/2013  . Degenerative disorder of muscle 08/03/2015  . Dysrhythmia 2001   ablation for a-flutter  . H/O atrial flutter 08/03/2015   Overview:  Pacemaker placed 2015   . High blood pressure   . High cholesterol   . History of pacemaker 2016  . Hx of cardiac cath 2013  . Loss of balance 08/07/2015  . Lumbar canal stenosis 07/07/2013   Overview:  Lumbar laminectomy 07/07/13 - Dr. Lurene Shadow   . Lumbar scoliosis 05/14/2013  .  Macular degeneration 08/07/2015  . Premature contractions, supraventricular 12/15/2008   Overview:  Atrial Premature Complex   . Presence of permanent cardiac pacemaker   . Tremor 08/07/2015  . Urinary frequency 08/07/2015   Past Surgical History:  Procedure Laterality Date  . ATRIAL FLUTTER ABLATION  2001  . CATARACT EXTRACTION Bilateral 2015  . CHOLECYSTECTOMY    . MASTOIDECTOMY  1934   lower back   . PACEMAKER PLACEMENT  2016  . SPINE SURGERY  2016  .  TONSILLECTOMY  1934  . TRIGGER FINGER RELEASE Left 12/14/2015   Procedure: RELEASE TRIGGER FINGER/A-1 PULLEY ring finger left;  Surgeon: Daryll Brod, MD;  Location: [facility];  Service: Orthopedics;  Laterality: Left;  FAB    reports that he quit smoking about 41 years ago. His smoking use included cigarettes. He quit after 50.00 years of use. he has never used smokeless tobacco. He reports that he drinks about 1.8 oz of alcohol per week. He reports that he does not use drugs. Social History   Socioeconomic History  . Marital status: Married    Spouse name: Not on file  . Number of children: Not on file  . Years of education: Not on file  . Highest education level: Not on file  Social Needs  . Financial resource strain: Not hard at all  . Food insecurity - worry: Never true  . Food insecurity - inability: Never true  . Transportation needs - medical: No  . Transportation needs - non-medical: No  Occupational History  . Not on file  Tobacco Use  . Smoking status: Former Smoker    Years: 50.00    Types: Cigarettes    Last attempt to quit: 08/03/1975    Years since quitting: 41.7  . Smokeless tobacco: Never Used  . Tobacco comment: smoked 6 cig dialy   Substance and Sexual Activity  . Alcohol use: Yes    Alcohol/week: 1.8 oz    Types: 3 Standard drinks or equivalent per week    Comment: 3 times a week  . Drug use: No  . Sexual activity: Not on file  Other Topics Concern  . Not on file  Social History Narrative   Lives at [facility]  Since 4 /16/2016 with wife [name]   Diet: n/a   Caffeine: Yes   Married: yes, 1957   House: Yes, 2 persons   Pets: no pets    Current/Past profession: Optometrist    Exercise: No   Living Will: Yes   DNR: Yes   POA/HPOA: Yes   Walks with walker    Functional Status Survey:    History reviewed. No pertinent family history.  Health Maintenance  Topic Date Due  . PNA vac Low Risk Adult (1 of 2 - PCV13) 07/31/1991  .  TETANUS/TDAP  02/25/2006  . INFLUENZA VACCINE  Completed    Allergies  Allergen Reactions  . Cortisone Other (See Comments)  . Neomycin Rash  . Sulfamethoxazole-Trimethoprim Other (See Comments)    Leg weakness    Outpatient Encounter Medications as of 04/15/2017  Medication Sig  . ALPRAZolam (XANAX) 0.5 MG tablet Take 0.5 mg by mouth as needed.   Marland Kitchen amLODipine (NORVASC) 10 MG tablet TAKE 1 TABLET DAILY  . aspirin EC 81 MG tablet Take 81 mg by mouth daily.  Marland Kitchen atorvastatin (LIPITOR) 40 MG tablet TAKE 1 TABLET DAILY  . magnesium hydroxide (MILK OF MAGNESIA) 800 MG/5ML suspension Take 30 mLs by mouth as needed for constipation.  Marland Kitchen  Multiple Vitamins-Minerals (PRESERVISION AREDS) CAPS Take 2 capsules by mouth daily.   . naproxen (NAPROSYN) 500 MG tablet Take 1,000 mg by mouth daily as needed.  . pantoprazole (PROTONIX) 40 MG tablet Take 40 mg by mouth daily.  . propafenone (RYTHMOL) 150 MG tablet Take 225 mg by mouth 4 (four) times daily. 1.5 tablets 4 times daily breakfast, lunch, dinner and at bedtime   . tamsulosin (FLOMAX) 0.4 MG CAPS capsule Take 1 capsule (0.4 mg total) by mouth at bedtime.  Marland Kitchen VITAMIN D, ERGOCALCIFEROL, PO Take 1 tablet by mouth daily. 2,000 units once daily  . [DISCONTINUED] loratadine (CLARITIN) 10 MG tablet Take 10 mg by mouth daily.  . [DISCONTINUED] pantoprazole (PROTONIX) 20 MG tablet Take 1 tablet (20 mg total) daily by mouth.   No facility-administered encounter medications on file as of 04/15/2017.     Review of Systems  Constitutional: Negative for chills and fever.  HENT: Positive for congestion. Negative for mouth sores, rhinorrhea, sinus pressure, sinus pain, sore throat and trouble swallowing.   Eyes: Positive for visual disturbance.       Wears corrective glasses. Follows with ophthalmology every 6 months  Respiratory: Negative for cough, choking, shortness of breath and wheezing.   Cardiovascular: Positive for palpitations and leg swelling. Negative  for chest pain.       Chronic leg edema, unchanged  Gastrointestinal: Negative for abdominal pain, blood in stool, constipation, diarrhea, nausea and vomiting.       Milk of magnesia helps  Genitourinary: Positive for frequency. Negative for dysuria and hematuria.       Has BPH and flomax is helping. Wakes up 1-2 times at night to urinate  Musculoskeletal: Positive for back pain and gait problem.       Uses walker for long distance  Skin: Negative for rash.  Neurological: Positive for numbness. Negative for dizziness, seizures, syncope and headaches.       Occasional numbness to legs.   Hematological: Bruises/bleeds easily.  Psychiatric/Behavioral: Negative for decreased concentration and sleep disturbance. The patient is not nervous/anxious.     Vitals:   04/15/17 1000  BP: 118/70  Pulse: 60  Resp: 16  Temp: (!) 97.4 F (36.3 C)  TempSrc: Oral  SpO2: 96%  Weight: 172 lb 12.8 oz (78.4 kg)  Height: 5' 9"  (1.753 m)   Body mass index is 25.52 kg/m.   Wt Readings from Last 3 Encounters:  04/15/17 172 lb 12.8 oz (78.4 kg)  01/28/17 162 lb (73.5 kg)  01/14/17 162 lb (73.5 kg)   Physical Exam  Constitutional: He is oriented to person, place, and time. He appears well-developed and well-nourished. No distress.  HENT:  Head: Normocephalic and atraumatic.  Right Ear: External ear normal.  Left Ear: External ear normal.  Nose: Nose normal.  Mouth/Throat: Oropharynx is clear and moist. No oropharyngeal exudate.  Impacted cerumen to both ears  Eyes: Conjunctivae and EOM are normal. Pupils are equal, round, and reactive to light. Right eye exhibits no discharge. Left eye exhibits no discharge.  Has corrective glasses  Neck: Normal range of motion. Neck supple.  Cardiovascular: Normal rate, regular rhythm and intact distal pulses.  Pulmonary/Chest: Effort normal and breath sounds normal. No respiratory distress. He has no wheezes. He has no rales. He exhibits no tenderness.    Abdominal: Soft. Bowel sounds are normal. There is no tenderness. There is no guarding.  Musculoskeletal: He exhibits edema and deformity. He exhibits no tenderness.  1+ edema to both legs, arthritis  changes to his fingers  Lymphadenopathy:    He has no cervical adenopathy.  Neurological: He is alert and oriented to person, place, and time. No cranial nerve deficit. He exhibits normal muscle tone.  Skin: Skin is warm and dry. No rash noted. He is not diaphoretic.  Psychiatric: He has a normal mood and affect. His behavior is normal.    Labs reviewed: Basic Metabolic Panel: Recent Labs    07/08/16 0811 01/09/17 0715 04/08/17 0710  NA 140 140 140  K 4.5 4.4 4.2  CL 105 104 106  CO2 27 27 26   GLUCOSE 90 112* 112*  BUN 35* 34* 29*  CREATININE 1.71* 1.58* 1.56*  CALCIUM 8.5* 9.2 8.8   Liver Function Tests: Recent Labs    07/08/16 0811 04/08/17 0710  AST 18 20  ALT 16 18  ALKPHOS 72  --   BILITOT 0.4 0.5  PROT 6.6 6.7  ALBUMIN 3.8  --    No results for input(s): LIPASE, AMYLASE in the last 8760 hours. No results for input(s): AMMONIA in the last 8760 hours. CBC: Recent Labs    04/21/16 1859 04/08/17 0710  WBC 6.6 7.6  NEUTROABS 4.5 4,233  HGB 11.9* 13.1*  HCT 34.7* 37.4*  MCV 91.3 89.9  PLT 140* 218   Cardiac Enzymes: No results for input(s): CKTOTAL, CKMB, CKMBINDEX, TROPONINI in the last 8760 hours. BNP: Invalid input(s): POCBNP Lab Results  Component Value Date   HGBA1C 5.8 (H) 04/08/2017   Lab Results  Component Value Date   TSH 2.83 04/08/2017   Lab Results  Component Value Date   VITAMINB12 819 04/08/2017   No results found for: FOLATE No results found for: IRON, TIBC, FERRITIN  Lipid Panel: Recent Labs    04/08/17 0710  CHOL 146  HDL 62  TRIG 67  CHOLHDL 2.4   Lab Results  Component Value Date   HGBA1C 5.8 (H) 04/08/2017    Procedures since last visit: No results found.  Assessment/Plan  1. Prediabetes With weight gain and  increased a1c, advised to cut down on sweets and walk for exercise as tolerated - CMP with eGFR([facility]); Future - Hemoglobin A1c; Future  2. CKD (chronic kidney disease) stage 3, GFR 30-59 ml/min (HCC) Stable BMP. - CMP with eGFR([facility]); Future  3. Bilateral impacted cerumen Debrox bid x 1 weeks and then ear lavage, appt set up  4. Benign essential HTN C/w norvasc and aspirin. Labs reviewed. Continue statin, lipid reviewed. Advised to wear compression stockings to help with leg edema.   5. Sick sinus syndrome [facility]) s/p pacemaker 2015. Follows with cardiology. continue propafenone.   6. Gastroesophageal reflux disease without esophagitis Continue protonix but decrease to 20 mg daily, pt voices understanding this  7. Benign prostatic hyperplasia with urinary obstruction C/w flomax  8. Spinal stenosis of lumbar region with neurogenic claudication C/w naproxen as needed, pt uses it 1-2 times a week. He understands risks associated with it but does not want to change it.   9. Mild cognitive impairment Stable TSH and B12. Supportive care for now.   10. PND (post-nasal drip) Off all medications, does jnot want to try any medication for now, supportive care.  11. Goals of care discussion Reviewed his living will and HCPOA paperwork, it is scanned in Epic. Pt has a DNR scanned in Epic. Patient would like to be a full code now. Advised to discard the DNR document. Filled out a MOST form with patient and his wife present. Patient would like  to remain full code and have CPR if required. He would like to be hospitalized with full scope of treatment if needed. He would like iv fluids and feeding tube if idicated for a trial period. He would like antibiotic if indicated. Form reviewed and signed by patient, his wife/ HCPOA and myself. Spent time from 9:45 am to 10:05 am reviewing goals of care.   12. Annual physical exam Immunization History  Administered Date(s) Administered  . Influenza, High  Dose Seasonal PF 12/04/2016  . Td 02/26/1996   Depression screen PHQ 2/9 01/28/2017  Decreased Interest 0  Down, Depressed, Hopeless 0  PHQ - 2 Score 0   Fall Risk  01/28/2017 09/24/2016 08/03/2015  Falls in the past year? Yes Yes No  Number falls in past yr: 1 2 or more -  Injury with Fall? No No -  Risk Factor Category  - High Fall Risk -  Risk for fall due to : - Impaired balance/gait -  Follow up - Falls evaluation completed;Education provided;Falls prevention discussed;Follow up appointment -   He has not had pneumococcal and shingles vaccine. He is uptodate with influenza vaccine. He refuses other vaccination at this time. Explained risk vs benefit and patient to reconsider and let me know.    Labs/tests ordered:  A1c, cmp  Next appointment: 4 months  Communication: reviewed care plan with patient and his wife     Blanchie Serve, MD Internal Medicine [place], [place] [address] Cell Phone (Monday-Friday 8 am - 5 pm): [phone] On Call: [phone] and follow prompts after 5 pm and on weekends Office Phone: [phone] Office Fax: [phone]

## 2017-04-22 ENCOUNTER — Non-Acute Institutional Stay: Payer: Medicare Other | Admitting: Internal Medicine

## 2017-04-22 ENCOUNTER — Encounter: Payer: Self-pay | Admitting: Internal Medicine

## 2017-04-22 DIAGNOSIS — H6123 Impacted cerumen, bilateral: Secondary | ICD-10-CM

## 2017-04-22 NOTE — Patient Instructions (Signed)
Irrigation of bilateral ears. I was able to get 25% out of both ears, but there is still some hard ear wax. Reviewed with Dr. Bubba Camp. She has looked into patients ears and would recommend continuing to use the Debrox ear drops for 2 weeks and to follow up.

## 2017-04-22 NOTE — Progress Notes (Signed)
  Examined patient's ear, some cerumen has been removed. Cerumen still present, unable to visualize tympanic membrane. Continue debrox bid x 2 weeks followed by another ear lavage.

## 2017-05-07 ENCOUNTER — Other Ambulatory Visit: Payer: Self-pay | Admitting: Internal Medicine

## 2017-05-28 ENCOUNTER — Telehealth: Payer: Self-pay | Admitting: *Deleted

## 2017-05-28 NOTE — Telephone Encounter (Signed)
Per Dr. Bubba Camp, [facility] Mast NP to provide the patient with a script since we are currently at [facility] with Elijah Birk, CMA and she stated that she would ask Manxie to provide the patient with a script. She stated that she would have the patient come down to the clinic to pick it up.

## 2017-05-28 NOTE — Telephone Encounter (Signed)
Patient called and stated that he is a patient of Dr. Jackolyn Confer at [facility]. Stated that the Medical Assistant attempted to remove ear wax and it was hard and blood started to come out. Stated that he requested a specialist but has not heard anything regarding an appointment. Would like to speak with Dr. Jackolyn Confer Assistant. Please call [phone]

## 2017-06-04 ENCOUNTER — Other Ambulatory Visit: Payer: Self-pay | Admitting: *Deleted

## 2017-06-04 DIAGNOSIS — H6123 Impacted cerumen, bilateral: Secondary | ICD-10-CM

## 2017-06-04 NOTE — Telephone Encounter (Signed)
Spoke with the [facility] regarding the ENT referral, Dr. Jeneen Rinks [place]'s office was given to them and informed them that they can give them a call regarding this appointment..Referral and last progress notes faxed to the office.

## 2017-06-10 DIAGNOSIS — H6122 Impacted cerumen, left ear: Secondary | ICD-10-CM | POA: Diagnosis not present

## 2017-06-23 NOTE — Addendum Note (Signed)
Addended byBlanchie Serve on: 06/23/2017 05:19 PM   Modules accepted: Level of Service

## 2017-06-27 ENCOUNTER — Other Ambulatory Visit: Payer: Self-pay | Admitting: Internal Medicine

## 2017-07-07 DIAGNOSIS — Z45010 Encounter for checking and testing of cardiac pacemaker pulse generator [battery]: Secondary | ICD-10-CM | POA: Diagnosis not present

## 2017-07-07 DIAGNOSIS — Z45018 Encounter for adjustment and management of other part of cardiac pacemaker: Secondary | ICD-10-CM | POA: Diagnosis not present

## 2017-08-05 DIAGNOSIS — N183 Chronic kidney disease, stage 3 unspecified: Secondary | ICD-10-CM

## 2017-08-05 DIAGNOSIS — R7303 Prediabetes: Secondary | ICD-10-CM | POA: Diagnosis not present

## 2017-08-06 LAB — COMPLETE METABOLIC PANEL WITH GFR
AG Ratio: 1.6 (calc) (ref 1.0–2.5)
ALBUMIN MSPROF: 3.9 g/dL (ref 3.6–5.1)
ALKALINE PHOSPHATASE (APISO): 80 U/L (ref 40–115)
ALT: 27 U/L (ref 9–46)
AST: 23 U/L (ref 10–35)
BUN / CREAT RATIO: 18 (calc) (ref 6–22)
BUN: 29 mg/dL — AB (ref 7–25)
CO2: 29 mmol/L (ref 20–32)
CREATININE: 1.64 mg/dL — AB (ref 0.70–1.11)
Calcium: 8.7 mg/dL (ref 8.6–10.3)
Chloride: 105 mmol/L (ref 98–110)
GFR, EST AFRICAN AMERICAN: 42 mL/min/{1.73_m2} — AB (ref >=60)
GFR, Est Non African American: 36 mL/min/{1.73_m2} — ABNORMAL LOW (ref >=60)
GLUCOSE: 113 mg/dL — AB (ref 65–99)
Globulin: 2.5 g/dL (calc) (ref 1.9–3.7)
Potassium: 4.5 mmol/L (ref 3.5–5.3)
Sodium: 141 mmol/L (ref 135–146)
TOTAL PROTEIN: 6.4 g/dL (ref 6.1–8.1)
Total Bilirubin: 0.7 mg/dL (ref 0.2–1.2)

## 2017-08-06 LAB — HEMOGLOBIN A1C
EAG (MMOL/L): 6.8 (calc)
Hgb A1c MFr Bld: 5.9 % of total Hgb — ABNORMAL HIGH (ref <5.7)
MEAN PLASMA GLUCOSE: 123 (calc)

## 2017-08-12 ENCOUNTER — Encounter: Payer: Self-pay | Admitting: Internal Medicine

## 2017-08-12 ENCOUNTER — Non-Acute Institutional Stay: Payer: Medicare Other | Admitting: Internal Medicine

## 2017-08-12 VITALS — BP 124/62 | HR 59 | Temp 97.5°F | Resp 20 | Ht 69.0 in | Wt 170.6 lb

## 2017-08-12 DIAGNOSIS — K219 Gastro-esophageal reflux disease without esophagitis: Secondary | ICD-10-CM | POA: Diagnosis not present

## 2017-08-12 DIAGNOSIS — I491 Atrial premature depolarization: Secondary | ICD-10-CM | POA: Diagnosis not present

## 2017-08-12 DIAGNOSIS — R7303 Prediabetes: Secondary | ICD-10-CM | POA: Diagnosis not present

## 2017-08-12 DIAGNOSIS — E785 Hyperlipidemia, unspecified: Secondary | ICD-10-CM | POA: Insufficient documentation

## 2017-08-12 DIAGNOSIS — N183 Chronic kidney disease, stage 3 unspecified: Secondary | ICD-10-CM | POA: Insufficient documentation

## 2017-08-12 DIAGNOSIS — Z23 Encounter for immunization: Secondary | ICD-10-CM | POA: Diagnosis not present

## 2017-08-12 DIAGNOSIS — I129 Hypertensive chronic kidney disease with stage 1 through stage 4 chronic kidney disease, or unspecified chronic kidney disease: Secondary | ICD-10-CM

## 2017-08-12 DIAGNOSIS — N401 Enlarged prostate with lower urinary tract symptoms: Secondary | ICD-10-CM

## 2017-08-12 DIAGNOSIS — R350 Frequency of micturition: Secondary | ICD-10-CM

## 2017-08-12 MED ORDER — PNEUMOCOCCAL 13-VAL CONJ VACC IM SUSP: 0.5000 mL | INTRAMUSCULAR | 0 refills | Status: AC

## 2017-08-12 MED ORDER — TETANUS-DIPHTH-ACELL PERTUSSIS 5-2-15.5 LF-MCG/0.5 IM SUSP: 0.5000 mL | Freq: Once | INTRAMUSCULAR | 0 refills | Status: AC

## 2017-08-12 MED ORDER — ZOSTER VAC RECOMB ADJUVANTED 50 MCG/0.5ML IM SUSR: 0.5000 mL | Freq: Once | INTRAMUSCULAR | 0 refills | Status: AC

## 2017-08-12 NOTE — Progress Notes (Signed)
Location:  [facility] (12) Provider:  Blanchie Serve MD  Blanchie Serve, MD  Patient Care Team: Blanchie Serve, MD as PCP - General (Internal Medicine) Mast, Man X, NP as Nurse Practitioner (Internal Medicine)  Extended Emergency Contact Information Primary Emergency Contact: Uplinger,[name] Address: [address]          [place], [place] [address] Johnnette Litter of [place] Phone: [phone] Relation: Spouse   Code Status:  DNR  Goals of care: Advanced Directive information Advanced Directives 08/12/2017  Does Patient Have a Medical Advance Directive? Yes  Type of Paramedic of [place];Living will;Out of facility DNR (pink MOST or yellow form)  Does patient want to make changes to medical advance directive? No - Patient declined  Copy of [place] in Chart? Yes  Pre-existing out of facility DNR order (yellow form or pink MOST form) Pink MOST form placed in chart (order not valid for inpatient use)     Chief Complaint  Patient presents with  . Medical Management of Chronic Issues    HPI:  Pt is a 84 y.o. male seen today for medical management of chronic diseases.    gerd- currently on pantoprazole 20 mg daily, symptom controlled. Denies reflux symptom  PVCs- controlled.Taking propafenone 225 mg qid  Hyperlipidemia- Takes lipitor 40 mg daily, tolerating well  Hypertension- with ckd 3, good urine output. Stable, taking amlodipine 10 mg daily. Denies any headache, dyspnea or chest pain  BPH with urinary symptom- has increased urinary frequency with urgency. Denies incontinence. Currently on flomax daily. Slow stream towards end of urination. Wakes up twice at night to urinate.   Immunization History  Administered Date(s) Administered  . Influenza, High Dose Seasonal PF 12/04/2016  . Td 02/26/1996   Due for tetanus vaccine and pneumococcal vaccine   Past Medical History:  Diagnosis  Date  . Acid reflux 08/03/2015  . Anxiety 08/03/2015  . Avitaminosis D 05/18/2012  . Benign essential HTN 02/09/2009   Overview:  Benign Essential Hypertension   . Benign prostatic hyperplasia with urinary obstruction 12/07/2013  . Chronic kidney disease (CKD), stage III (moderate) ([place]) 11/04/2011  . CN (constipation) 08/03/2015  . Cognitive changes 08/07/2015  . Degenerative arthritis of hip 05/14/2013  . Degenerative arthritis of spine 05/14/2013  . Degenerative disorder of muscle 08/03/2015  . Dysrhythmia 2001   ablation for a-flutter  . H/O atrial flutter 08/03/2015   Overview:  Pacemaker placed 2015   . High blood pressure   . High cholesterol   . History of pacemaker 2016  . Hx of cardiac cath 2013  . Loss of balance 08/07/2015  . Lumbar canal stenosis 07/07/2013   Overview:  Lumbar laminectomy 07/07/13 - Dr. Lurene Shadow   . Lumbar scoliosis 05/14/2013  . Macular degeneration 08/07/2015  . Premature contractions, supraventricular 12/15/2008   Overview:  Atrial Premature Complex   . Presence of permanent cardiac pacemaker   . Tremor 08/07/2015  . Urinary frequency 08/07/2015   Past Surgical History:  Procedure Laterality Date  . ATRIAL FLUTTER ABLATION  2001  . CATARACT EXTRACTION Bilateral 2015  . CHOLECYSTECTOMY    . MASTOIDECTOMY  1934   lower back   . PACEMAKER PLACEMENT  2016  . SPINE SURGERY  2016  . TONSILLECTOMY  1934  . TRIGGER FINGER RELEASE Left 12/14/2015   Procedure: RELEASE TRIGGER FINGER/A-1 PULLEY ring finger left;  Surgeon: Daryll Brod, MD;  Location: [facility];  Service: Orthopedics;  Laterality: Left;  FAB    Allergies  Allergen Reactions  . Cortisone Other (See Comments)  . Neomycin Rash  . Sulfamethoxazole-Trimethoprim Other (See Comments)    Leg weakness    Outpatient Encounter Medications as of 08/12/2017  Medication Sig  . ALPRAZolam (XANAX) 0.5 MG tablet Take 0.5 mg by mouth as needed.   Marland Kitchen amLODipine (NORVASC) 10 MG tablet TAKE 1 TABLET DAILY   . aspirin EC 81 MG tablet Take 81 mg by mouth daily.  Marland Kitchen atorvastatin (LIPITOR) 40 MG tablet TAKE 1 TABLET DAILY  . carbamide peroxide (DEBROX) 6.5 % OTIC solution Place 5 drops into both ears 2 (two) times daily.  Water engineer Bandages & Supports (MEDICAL COMPRESSION STOCKINGS) MISC 1 piar, wear it in the morning and remove it at bedtime to help with leg swelling  . magnesium hydroxide (MILK OF MAGNESIA) 800 MG/5ML suspension Take 30 mLs by mouth as needed for constipation.  . Multiple Vitamins-Minerals (PRESERVISION AREDS) CAPS Take 2 capsules by mouth daily.   . naproxen (NAPROSYN) 500 MG tablet Take 1,000 mg by mouth daily as needed.  . pantoprazole (PROTONIX) 40 MG tablet Take 20 mg by mouth daily.  . propafenone (RYTHMOL) 150 MG tablet Take 225 mg by mouth 4 (four) times daily. 1.5 tablets 4 times daily breakfast, lunch, dinner and at bedtime   . tamsulosin (FLOMAX) 0.4 MG CAPS capsule TAKE 1 CAPSULE AT BEDTIME  . VITAMIN D, ERGOCALCIFEROL, PO Take 1 tablet by mouth daily. 2,000 units once daily   No facility-administered encounter medications on file as of 08/12/2017.     Review of Systems  Constitutional: Negative for appetite change, chills and fever.  HENT: Negative for congestion, hearing loss, mouth sores, postnasal drip, sinus pain, tinnitus and trouble swallowing.   Eyes: Positive for visual disturbance.       Has corrective glasses, last eye exam 02/2017, has macular degeneration  Respiratory: Negative for cough, shortness of breath and wheezing.   Cardiovascular: Positive for leg swelling. Negative for chest pain and palpitations.  Gastrointestinal: Positive for constipation. Negative for abdominal pain, diarrhea, nausea and vomiting.       Milk of magnesia helps with regulating his bowel movement  Genitourinary: Positive for frequency. Negative for dysuria, flank pain and hematuria.       Denies incontinence  Musculoskeletal: Positive for back pain and gait problem.        Unsteady gait, uses walker with 4 wheels and seat, no fall reported  Skin: Negative for rash.  Neurological: Positive for tremors. Negative for dizziness, weakness, numbness and headaches.  Hematological: Bruises/bleeds easily.    Immunization History  Administered Date(s) Administered  . Influenza, High Dose Seasonal PF 12/04/2016  . Td 02/26/1996   Pertinent  Health Maintenance Due  Topic Date Due  . PNA vac Low Risk Adult (1 of 2 - PCV13) 07/31/1991  . INFLUENZA VACCINE  09/25/2017   Fall Risk  01/28/2017 09/24/2016 08/03/2015  Falls in the past year? Yes Yes No  Number falls in past yr: 1 2 or more -  Injury with Fall? No No -  Risk Factor Category  - High Fall Risk -  Risk for fall due to : - Impaired balance/gait -  Follow up - Falls evaluation completed;Education provided;Falls prevention discussed;Follow up appointment -   Functional Status Survey:    Vitals:   08/12/17 0903  BP: 124/62  Pulse: (!) 59  Resp: 20  Temp: (!) 97.5 F (36.4 C)  SpO2: 97%  Weight: 170 lb 9.6 oz (77.4 kg)  Height: 5' 9" (1.753 m)   Body mass index is 25.19 kg/m.   Wt Readings from Last 3 Encounters:  08/12/17 170 lb 9.6 oz (77.4 kg)  04/22/17 172 lb (78 kg)  04/15/17 172 lb 12.8 oz (78.4 kg)   Physical Exam  Constitutional: He is oriented to person, place, and time. He appears well-developed and well-nourished. No distress.  HENT:  Head: Normocephalic and atraumatic.  Nose: Nose normal.  Mouth/Throat: Oropharynx is clear and moist. No oropharyngeal exudate.  Eyes: Pupils are equal, round, and reactive to light. Conjunctivae and EOM are normal.  Has corrective glasses  Neck: Normal range of motion. Neck supple.  Cardiovascular: Normal rate, regular rhythm and intact distal pulses.  Pulmonary/Chest: Effort normal and breath sounds normal. He has no wheezes. He has no rales.  Abdominal: Soft. Bowel sounds are normal. There is no tenderness.  Musculoskeletal: He exhibits edema.    Trace leg edema, can move all 4 extremities, no spinal tenderness, unsteady gait, uses four wheel walker with seat.   Lymphadenopathy:    He has no cervical adenopathy.  Neurological: He is alert and oriented to person, place, and time.  Skin: Skin is warm and dry. He is not diaphoretic.  Bruise to both arms  Psychiatric: He has a normal mood and affect.    Labs reviewed: Recent Labs    01/09/17 0715 04/08/17 0710 08/05/17 0705  NA 140 140 141  K 4.4 4.2 4.5  CL 104 106 105  CO2 _0 GLUCOSE 112* 112* 113*  BUN 34* 29* 29*  CREATININE 1.58* 1.56* 1.64*  CALCIUM 9.2 8.8 8.7   Recent Labs    04/08/17 0710 08/05/17 0705  AST 20 23  ALT 18 27  BILITOT 0.5 0.7  PROT 6.7 6.4   Recent Labs    04/08/17 0710  WBC 7.6  NEUTROABS 4,233  HGB 13.1*  HCT 37.4*  MCV 89.9  PLT 218   Lab Results  Component Value Date   TSH 2.83 04/08/2017   Lab Results  Component Value Date   HGBA1C 5.9 (H) 08/05/2017   Lab Results  Component Value Date   CHOL 146 04/08/2017   HDL 62 04/08/2017   LDLCALC 70 04/08/2017   TRIG 67 04/08/2017   CHOLHDL 2.4 04/08/2017    Significant Diagnostic Results in last 30 days:  No results found.  Assessment/Plan  1. Premature contractions, supraventricular Continue propafenone and monitor - CBC (no diff)  2. Gastroesophageal reflux disease without esophagitis Stable, continue pantoprazole  3. Benign prostatic hyperplasia with urinary frequency Continue flomax and perineal hygiene  4. Immunization due - Tdap (ADACEL) 06-26-13.5 LF-MCG/0.5 injection; Inject 0.5 mLs into the muscle once for 1 dose.  Dispense: 0.5 mL; Refill: 0 - Zoster Vaccine Adjuvanted [facility]) injection; Inject 0.5 mLs into the muscle once for 1 dose.  Dispense: 0.5 mL; Refill: 0 - pneumococcal 13-valent conjugate vaccine (PREVNAR 13) SUSP injection; Inject 0.5 mLs into the muscle tomorrow at 10 am for 1 dose.  Dispense: 0.5 mL; Refill: 0  5.  Prediabetes Reviewed labs with patient. counselled on cutting down on sweets. Walking for exercise as tolerated, some limitation with spinal stenosis - TSH - Hemoglobin A1c  6. Hyperlipidemia, unspecified hyperlipidemia type Continue statin - CMP with eGFR([facility]) - Lipid Panel  7. Benign hypertension with chronic kidney disease, stage III (HCC) Continue amlodipine, reviewed renal function - CMP with eGFR([facility]) - Lipid Panel -  CBC (no diff)    Family/ staff Communication: reviewed care plan with patient   Labs/tests ordered:   Lab Orders     CMP with eGFR([facility])     Lipid Panel     CBC (no diff)     TSH     Hemoglobin A1c   Follow up- 4 months for physical with MMSE   Blanchie Serve, MD Internal Medicine [facility] [address] [place], [place] [address] Cell Phone (Monday-Friday 8 am - 5 pm): [phone] On Call: [phone] and follow prompts after 5 pm and on weekends Office Phone: [phone] Office Fax: [phone]

## 2017-08-12 NOTE — Patient Instructions (Signed)
  Wear compression stockings when possible to help with swelling to your legs.  I have sent script for vaccine to [facility] [address], get your vaccination from the pharmacy

## 2017-08-13 DIAGNOSIS — Z23 Encounter for immunization: Secondary | ICD-10-CM | POA: Diagnosis not present

## 2017-08-18 DIAGNOSIS — F4322 Adjustment disorder with anxiety: Secondary | ICD-10-CM | POA: Diagnosis not present

## 2017-09-20 ENCOUNTER — Other Ambulatory Visit: Payer: Self-pay | Admitting: Internal Medicine

## 2017-09-28 ENCOUNTER — Other Ambulatory Visit: Payer: Self-pay | Admitting: Internal Medicine

## 2017-10-08 ENCOUNTER — Encounter: Payer: Self-pay | Admitting: Internal Medicine

## 2017-11-05 ENCOUNTER — Other Ambulatory Visit: Payer: Self-pay | Admitting: Internal Medicine

## 2017-11-05 DIAGNOSIS — I495 Sick sinus syndrome: Secondary | ICD-10-CM | POA: Diagnosis not present

## 2017-11-05 DIAGNOSIS — I491 Atrial premature depolarization: Secondary | ICD-10-CM | POA: Diagnosis not present

## 2017-11-05 DIAGNOSIS — Z950 Presence of cardiac pacemaker: Secondary | ICD-10-CM | POA: Diagnosis not present

## 2017-11-05 DIAGNOSIS — I10 Essential (primary) hypertension: Secondary | ICD-10-CM | POA: Diagnosis not present

## 2017-12-03 DIAGNOSIS — N183 Chronic kidney disease, stage 3 (moderate): Secondary | ICD-10-CM | POA: Diagnosis not present

## 2017-12-03 DIAGNOSIS — E785 Hyperlipidemia, unspecified: Secondary | ICD-10-CM | POA: Diagnosis not present

## 2017-12-03 DIAGNOSIS — R7303 Prediabetes: Secondary | ICD-10-CM | POA: Diagnosis not present

## 2017-12-03 DIAGNOSIS — I129 Hypertensive chronic kidney disease with stage 1 through stage 4 chronic kidney disease, or unspecified chronic kidney disease: Secondary | ICD-10-CM | POA: Diagnosis not present

## 2017-12-03 DIAGNOSIS — I491 Atrial premature depolarization: Secondary | ICD-10-CM | POA: Diagnosis not present

## 2017-12-04 ENCOUNTER — Other Ambulatory Visit: Payer: Medicare Other

## 2017-12-05 ENCOUNTER — Telehealth: Payer: Self-pay

## 2017-12-05 DIAGNOSIS — R739 Hyperglycemia, unspecified: Secondary | ICD-10-CM

## 2017-12-05 DIAGNOSIS — I129 Hypertensive chronic kidney disease with stage 1 through stage 4 chronic kidney disease, or unspecified chronic kidney disease: Secondary | ICD-10-CM

## 2017-12-05 DIAGNOSIS — E785 Hyperlipidemia, unspecified: Secondary | ICD-10-CM

## 2017-12-05 DIAGNOSIS — N183 Chronic kidney disease, stage 3 (moderate): Secondary | ICD-10-CM

## 2017-12-05 DIAGNOSIS — R7303 Prediabetes: Secondary | ICD-10-CM

## 2017-12-05 LAB — CBC
HEMATOCRIT: 39.7 % (ref 38.5–50.0)
HEMOGLOBIN: 13 g/dL — AB (ref 13.2–17.1)
MCH: 31.6 pg (ref 27.0–33.0)
MCHC: 32.7 g/dL (ref 32.0–36.0)
MCV: 96.4 fL (ref 80.0–100.0)
MPV: 11.5 fL (ref 7.5–12.5)
Platelets: 201 10*3/uL (ref 140–400)
RBC: 4.12 10*6/uL — AB (ref 4.20–5.80)
RDW: 12.7 % (ref 11.0–15.0)
WBC: 7.1 10*3/uL (ref 3.8–10.8)

## 2017-12-05 LAB — COMPLETE METABOLIC PANEL WITH GFR
AG Ratio: 1.4 (calc) (ref 1.0–2.5)
ALBUMIN MSPROF: 3.9 g/dL (ref 3.6–5.1)
ALKALINE PHOSPHATASE (APISO): 91 U/L (ref 40–115)
ALT: 18 U/L (ref 9–46)
AST: 17 U/L (ref 10–35)
BILIRUBIN TOTAL: 0.5 mg/dL (ref 0.2–1.2)
BUN / CREAT RATIO: 19 (calc) (ref 6–22)
BUN: 29 mg/dL — AB (ref 7–25)
CO2: 26 mmol/L (ref 20–32)
Calcium: 9 mg/dL (ref 8.6–10.3)
Chloride: 105 mmol/L (ref 98–110)
Creat: 1.51 mg/dL — ABNORMAL HIGH (ref 0.70–1.11)
GFR, Est African American: 46 mL/min/{1.73_m2} — ABNORMAL LOW (ref >=60)
GFR, Est Non African American: 40 mL/min/{1.73_m2} — ABNORMAL LOW (ref >=60)
GLOBULIN: 2.8 g/dL (ref 1.9–3.7)
GLUCOSE: 108 mg/dL — AB (ref 65–99)
Potassium: 4.2 mmol/L (ref 3.5–5.3)
SODIUM: 140 mmol/L (ref 135–146)
Total Protein: 6.7 g/dL (ref 6.1–8.1)

## 2017-12-05 LAB — LIPID PANEL
Cholesterol: 153 mg/dL (ref <200)
HDL: 69 mg/dL (ref >40)
LDL Cholesterol (Calc): 70 mg/dL (calc)
NON-HDL CHOLESTEROL (CALC): 84 mg/dL (ref <130)
TRIGLYCERIDES: 56 mg/dL (ref <150)
Total CHOL/HDL Ratio: 2.2 (calc) (ref <5.0)

## 2017-12-05 LAB — HEMOGLOBIN A1C
Hgb A1c MFr Bld: 5.8 % of total Hgb — ABNORMAL HIGH (ref <5.7)
Mean Plasma Glucose: 120 (calc)
eAG (mmol/L): 6.6 (calc)

## 2017-12-05 LAB — TSH: TSH: 3.75 m[IU]/L (ref 0.40–4.50)

## 2017-12-05 MED ORDER — ATORVASTATIN CALCIUM 20 MG PO TABS: 20.0000 mg | ORAL_TABLET | Freq: Every day | ORAL | 1 refills | Status: DC

## 2017-12-05 NOTE — Telephone Encounter (Signed)
Discussed results with patient, patient verbalized understanding of results  1.) Scheduled fasting lab appt for 04/02/2018, appt desk mailed to patient as a reminder  2.) Future orders placed for labs under [facility], for patient is at [facility].   3.) Undated medication list to reflect change in atorvastatin. Sent new rx for decreased dosage to [facility].  4.) Copy of labs mailed to patient  S.Chrae B/CMA

## 2017-12-08 DIAGNOSIS — H35371 Puckering of macula, right eye: Secondary | ICD-10-CM | POA: Diagnosis not present

## 2017-12-08 DIAGNOSIS — H353132 Nonexudative age-related macular degeneration, bilateral, intermediate dry stage: Secondary | ICD-10-CM | POA: Diagnosis not present

## 2017-12-08 DIAGNOSIS — H43813 Vitreous degeneration, bilateral: Secondary | ICD-10-CM | POA: Diagnosis not present

## 2017-12-09 ENCOUNTER — Encounter: Payer: Self-pay | Admitting: Internal Medicine

## 2017-12-11 ENCOUNTER — Ambulatory Visit: Payer: Medicare Other | Admitting: Nurse Practitioner

## 2017-12-11 ENCOUNTER — Encounter: Payer: Self-pay | Admitting: Nurse Practitioner

## 2017-12-11 DIAGNOSIS — I1310 Hypertensive heart and chronic kidney disease without heart failure, with stage 1 through stage 4 chronic kidney disease, or unspecified chronic kidney disease: Secondary | ICD-10-CM | POA: Diagnosis not present

## 2017-12-11 DIAGNOSIS — I491 Atrial premature depolarization: Secondary | ICD-10-CM | POA: Diagnosis not present

## 2017-12-11 DIAGNOSIS — E785 Hyperlipidemia, unspecified: Secondary | ICD-10-CM | POA: Diagnosis not present

## 2017-12-11 DIAGNOSIS — R350 Frequency of micturition: Secondary | ICD-10-CM

## 2017-12-11 NOTE — Assessment & Plan Note (Addendum)
Her LDL 70 12/03/17 is at goal, continue  Atorvastatin 20mg  qd. Lipid panel prior to the next appointment.

## 2017-12-11 NOTE — Patient Instructions (Addendum)
Avoid NSADs due to chronic kidney disease. CBC CMP prior to the next appointment. Amlodipine may contribute to the hronic swelling legs

## 2017-12-11 NOTE — Assessment & Plan Note (Signed)
.  heart rate is in control, continue Propafenone 225mg  qid.

## 2017-12-11 NOTE — Assessment & Plan Note (Signed)
No urinary retention, continue  tamsulosin 0.4mg  qd

## 2017-12-11 NOTE — Assessment & Plan Note (Addendum)
Blood pressure is controlled, continue Amlodipine 10mg  qd. CKD, stable, creat 1.51 12/03/17, it was 1.64 08/05/17. Avoid NSADs. Update CBC/CMP prior to the next appointment

## 2017-12-11 NOTE — Progress Notes (Signed)
Location:   clinic [facility]   Place of Service:   clinic [place] Provider: Marlana Latus NP  Code Status: DNR Goals of Care: [place] Advanced Directives 08/12/2017  Does Patient Have a Medical Advance Directive? Yes  Type of Paramedic of [place];Living will;Out of facility DNR (pink MOST or yellow form)  Does patient want to make changes to medical advance directive? No - Patient declined  Copy of [place] in Chart? Yes  Pre-existing out of facility DNR order (yellow form or pink MOST form) Pink MOST form placed in chart (order not valid for inpatient use)     Chief Complaint  Patient presents with  . Medical Management of Chronic Issues    4 mo f/u    HPI: Patient is a 84 y.o. male seen today for medical concerns. He has history of CKD, stable, creat 1.51 12/03/17, it was 1.64 08/05/17. His LDL 70 12/03/17 is at goal, on Atorvastatin 20mg  qd. No urinary retention while on tamsulosin 0.4mg  qd, heart rate/rhythm are  in control, had pacemaker,  on Propafenone 225mg  qid. Hx of HTN, blood pressures is controlled on Amlodipine 10mg  qd. Anxiety is managed with prn Alprazolam 0.5mg .    Past Medical History:  Diagnosis Date  . Acid reflux 08/03/2015  . Anxiety 08/03/2015  . Avitaminosis D 05/18/2012  . Benign essential HTN 02/09/2009   Overview:  Benign Essential Hypertension   . Benign prostatic hyperplasia with urinary obstruction 12/07/2013  . Chronic kidney disease (CKD), stage III (moderate) ([place]) 11/04/2011  . CN (constipation) 08/03/2015  . Cognitive changes 08/07/2015  . Degenerative arthritis of hip 05/14/2013  . Degenerative arthritis of spine 05/14/2013  . Degenerative disorder of muscle 08/03/2015  . Dysrhythmia 2001   ablation for a-flutter  . H/O atrial flutter 08/03/2015   Overview:  Pacemaker placed 2015   . High blood pressure   . High cholesterol   . History of pacemaker 2016  . Hx of cardiac cath 2013  . Loss of balance 08/07/2015  . Lumbar  canal stenosis 07/07/2013   Overview:  Lumbar laminectomy 07/07/13 - Dr. Lurene Shadow   . Lumbar scoliosis 05/14/2013  . Macular degeneration 08/07/2015  . Premature contractions, supraventricular 12/15/2008   Overview:  Atrial Premature Complex   . Presence of permanent cardiac pacemaker   . Tremor 08/07/2015  . Urinary frequency 08/07/2015    Past Surgical History:  Procedure Laterality Date  . ATRIAL FLUTTER ABLATION  2001  . CATARACT EXTRACTION Bilateral 2015  . CHOLECYSTECTOMY    . MASTOIDECTOMY  1934   lower back   . PACEMAKER PLACEMENT  2016  . SPINE SURGERY  2016  . TONSILLECTOMY  1934  . TRIGGER FINGER RELEASE Left 12/14/2015   Procedure: RELEASE TRIGGER FINGER/A-1 PULLEY ring finger left;  Surgeon: Daryll Brod, MD;  Location: [facility];  Service: Orthopedics;  Laterality: Left;  FAB    Allergies  Allergen Reactions  . Cortisone Other (See Comments)  . Neomycin Rash  . Sulfamethoxazole-Trimethoprim Other (See Comments)    Leg weakness    Allergies as of 12/11/2017      Reactions   Cortisone Other (See Comments)   Neomycin Rash   Sulfamethoxazole-trimethoprim Other (See Comments)   Leg weakness      Medication List        Accurate as of 12/11/17 11:59 PM. Always use your most recent med list.          ALPRAZolam 0.5 MG tablet  Commonly known as:  XANAX Take 0.5 mg by mouth as needed.   amLODipine 10 MG tablet Commonly known as:  NORVASC TAKE 1 TABLET DAILY   aspirin EC 81 MG tablet Take 81 mg by mouth daily.   atorvastatin 20 MG tablet Commonly known as:  LIPITOR Take 1 tablet (20 mg total) by mouth daily.   magnesium hydroxide 800 MG/5ML suspension Commonly known as:  MILK OF MAGNESIA Take 30 mLs by mouth as needed for constipation.   Medical Compression Stockings Misc 1 piar, wear it in the morning and remove it at bedtime to help with leg swelling   naproxen 500 MG tablet Commonly known as:  NAPROSYN Take 1,000 mg by mouth daily  as needed.   pantoprazole 20 MG tablet Commonly known as:  PROTONIX Take 20 mg by mouth daily.   PRESERVISION AREDS Caps Take 2 capsules by mouth daily.   propafenone 150 MG tablet Commonly known as:  RYTHMOL Take 225 mg by mouth 4 (four) times daily. 1.5 tablets 4 times daily breakfast, lunch, dinner and at bedtime   tamsulosin 0.4 MG Caps capsule Commonly known as:  FLOMAX TAKE 1 CAPSULE AT BEDTIME   VITAMIN D (ERGOCALCIFEROL) PO Take 1 tablet by mouth daily. 2,000 units once daily      Wife presents with the patient during today's visit. Review of Systems:  Review of Systems  Constitutional: Negative for activity change, appetite change, chills, diaphoresis and fatigue.  HENT: Positive for hearing loss. Negative for congestion and voice change.   Eyes: Positive for visual disturbance.       Macular degeneration, last eye exam 02/2017  Respiratory: Negative for cough, shortness of breath and wheezing.   Cardiovascular: Positive for leg swelling. Negative for chest pain and palpitations.  Gastrointestinal: Negative for abdominal distention, abdominal pain, constipation, diarrhea, nausea and vomiting.  Genitourinary: Positive for frequency. Negative for difficulty urinating and urgency.       2x/night  Musculoskeletal: Positive for back pain and gait problem.  Skin: Negative for color change and pallor.  Neurological: Positive for tremors. Negative for dizziness and headaches.  Psychiatric/Behavioral: Negative for agitation, behavioral problems, hallucinations and sleep disturbance. The patient is not nervous/anxious.     Health Maintenance  Topic Date Due  . PNA vac Low Risk Adult (2 of 2 - PPSV23) 08/14/2018  . TETANUS/TDAP  08/14/2027  . INFLUENZA VACCINE  Completed    Physical Exam: Vitals:   12/11/17 1334  BP: (!) 118/52  Pulse: 66  Resp: 18  Temp: 97.6 F (36.4 C)  SpO2: 94%  Weight: 166 lb 9.6 oz (75.6 kg)  Height: 5\' 9"  (1.753 m)   Body mass index is  24.6 kg/m. Physical Exam  Constitutional: He is oriented to person, place, and time. He appears well-developed and well-nourished.  HENT:  Head: Normocephalic and atraumatic.  Eyes: Pupils are equal, round, and reactive to light. EOM are normal.  Neck: Normal range of motion. Neck supple. No JVD present. No thyromegaly present.  Cardiovascular: Normal rate and regular rhythm.  No murmur heard. pacemaker  Pulmonary/Chest: Effort normal. He has no wheezes. He has no rales.  Abdominal: Soft. He exhibits no distension. There is no tenderness. There is no rebound and no guarding.  Musculoskeletal: He exhibits edema.  Trace edema BLE. Ambulates with walker. Scoliosis   Neurological: He is alert and oriented to person, place, and time. No cranial nerve deficit. He exhibits normal muscle tone. Coordination normal.  Skin: Skin is warm and  dry.  Psychiatric: He has a normal mood and affect. His behavior is normal. Judgment and thought content normal.    Labs reviewed: Basic Metabolic Panel: Recent Labs    04/08/17 0710 08/05/17 0705 12/03/17 0000  NA 140 141 140  K 4.2 4.5 4.2  CL 106 105 105  CO2 26 29 26   GLUCOSE 112* 113* 108*  BUN 29* 29* 29*  CREATININE 1.56* 1.64* 1.51*  CALCIUM 8.8 8.7 9.0  TSH 2.83  --  3.75   Liver Function Tests: Recent Labs    04/08/17 0710 08/05/17 0705 12/03/17 0000  AST 20 23 17   ALT 18 27 18   BILITOT 0.5 0.7 0.5  PROT 6.7 6.4 6.7   No results for input(s): LIPASE, AMYLASE in the last 8760 hours. No results for input(s): AMMONIA in the last 8760 hours. CBC: Recent Labs    04/08/17 0710 12/03/17 0000  WBC 7.6 7.1  NEUTROABS 4,233  --   HGB 13.1* 13.0*  HCT 37.4* 39.7  MCV 89.9 96.4  PLT 218 201   Lipid Panel: Recent Labs    04/08/17 0710 12/03/17 0000  CHOL 146 153  HDL 62 69  LDLCALC 70 70  TRIG 67 56  CHOLHDL 2.4 2.2   Lab Results  Component Value Date   HGBA1C 5.8 (H) 12/03/2017    Procedures since last visit: No  results found.  Assessment/Plan  Hypertensive heart and renal disease Blood pressure is controlled, continue Amlodipine 10mg  qd. CKD, stable, creat 1.51 12/03/17, it was 1.64 08/05/17. Avoid NSADs. Update CBC/CMP prior to the next appointment  Premature contractions, supraventricular .heart rate is in control, continue Propafenone 225mg  qid.   Hyperlipidemia Her LDL 70 12/03/17 is at goal, continue  Atorvastatin 20mg  qd. Lipid panel prior to the next appointment.   Urinary frequency No urinary retention, continue  tamsulosin 0.4mg  qd   Labs/tests ordered: lipid panel, CBC. CMP  Next appt:  04/02/2018  Time spend 25 minutes.

## 2017-12-15 ENCOUNTER — Encounter: Payer: Self-pay | Admitting: Nurse Practitioner

## 2017-12-24 DIAGNOSIS — H35371 Puckering of macula, right eye: Secondary | ICD-10-CM | POA: Diagnosis not present

## 2017-12-24 DIAGNOSIS — H53031 Strabismic amblyopia, right eye: Secondary | ICD-10-CM | POA: Diagnosis not present

## 2017-12-24 DIAGNOSIS — H50011 Monocular esotropia, right eye: Secondary | ICD-10-CM | POA: Diagnosis not present

## 2017-12-24 DIAGNOSIS — H353132 Nonexudative age-related macular degeneration, bilateral, intermediate dry stage: Secondary | ICD-10-CM | POA: Diagnosis not present

## 2017-12-30 ENCOUNTER — Other Ambulatory Visit: Payer: Self-pay | Admitting: *Deleted

## 2017-12-30 MED ORDER — TAMSULOSIN HCL 0.4 MG PO CAPS: 0.4000 mg | ORAL_CAPSULE | Freq: Every day | ORAL | 1 refills | Status: DC

## 2017-12-30 NOTE — Telephone Encounter (Signed)
[facility]

## 2018-01-13 ENCOUNTER — Other Ambulatory Visit: Payer: Self-pay

## 2018-01-14 ENCOUNTER — Non-Acute Institutional Stay: Payer: Medicare Other | Admitting: Family Medicine

## 2018-01-14 ENCOUNTER — Encounter: Payer: Self-pay | Admitting: Family Medicine

## 2018-01-14 DIAGNOSIS — R0981 Nasal congestion: Secondary | ICD-10-CM | POA: Insufficient documentation

## 2018-01-14 MED ORDER — MONTELUKAST SODIUM 10 MG PO TABS: 10.0000 mg | ORAL_TABLET | Freq: Every day | ORAL | 0 refills | Status: DC

## 2018-01-14 NOTE — Progress Notes (Signed)
Location:  [place] of Service:  Clinic [phone])  Provider: Alain Honey MD  Code Status: DNR Goals of Care:  Advanced Directives 01/14/2018  Does Patient Have a Medical Advance Directive? Yes  Type of Paramedic of [place];Living will;Out of facility DNR (pink MOST or yellow form)  Does patient want to make changes to medical advance directive? No - Patient declined  Copy of [place] in Chart? Yes - validated most recent copy scanned in chart (See row information)  Pre-existing out of facility DNR order (yellow form or pink MOST form) Yellow form placed in chart (order not valid for inpatient use);Pink MOST form placed in chart (order not valid for inpatient use)     Chief Complaint  Patient presents with  . Acute Visit    Patient complains of sinus issues, usually worse at night, over 1 year duration    HPI: Patient is a 84 y.o. male seen today for medical management of chronic diseases.  Patient complains of sinus congestion for over a year symptoms are worse at night and seemed to clear in the daytime.  He has tried several OTC medicines including his partial relief.  He denies headaches pain or pressure in the sinuses.  Or postnasal drainage except occasionally   Past Medical History:  Diagnosis Date  . Acid reflux 08/03/2015  . Anxiety 08/03/2015  . Avitaminosis D 05/18/2012  . Benign essential HTN 02/09/2009   Overview:  Benign Essential Hypertension   . Benign prostatic hyperplasia with urinary obstruction 12/07/2013  . Chronic kidney disease (CKD), stage III (moderate) ([place]) 11/04/2011  . CN (constipation) 08/03/2015  . Cognitive changes 08/07/2015  . Degenerative arthritis of hip 05/14/2013  . Degenerative arthritis of spine 05/14/2013  . Degenerative disorder of muscle 08/03/2015  . Dysrhythmia 2001   ablation for a-flutter  . H/O atrial flutter 08/03/2015   Overview:  Pacemaker placed 2015   . High blood pressure    . High cholesterol   . History of pacemaker 2016  . Hx of cardiac cath 2013  . Loss of balance 08/07/2015  . Lumbar canal stenosis 07/07/2013   Overview:  Lumbar laminectomy 07/07/13 - Dr. Lurene Shadow   . Lumbar scoliosis 05/14/2013  . Macular degeneration 08/07/2015  . Premature contractions, supraventricular 12/15/2008   Overview:  Atrial Premature Complex   . Presence of permanent cardiac pacemaker   . Tremor 08/07/2015  . Urinary frequency 08/07/2015    Past Surgical History:  Procedure Laterality Date  . ATRIAL FLUTTER ABLATION  2001  . CATARACT EXTRACTION Bilateral 2015  . CHOLECYSTECTOMY    . MASTOIDECTOMY  1934   lower back   . PACEMAKER PLACEMENT  2016  . SPINE SURGERY  2016  . TONSILLECTOMY  1934  . TRIGGER FINGER RELEASE Left 12/14/2015   Procedure: RELEASE TRIGGER FINGER/A-1 PULLEY ring finger left;  Surgeon: Daryll Brod, MD;  Location: [facility];  Service: Orthopedics;  Laterality: Left;  FAB    Allergies  Allergen Reactions  . Cortisone Other (See Comments)  . Neomycin Rash  . Sulfamethoxazole-Trimethoprim Other (See Comments)    Leg weakness    Outpatient Encounter Medications as of 01/14/2018  Medication Sig  . ALPRAZolam (XANAX) 0.5 MG tablet Take 0.5 mg by mouth as needed.   Marland Kitchen amLODipine (NORVASC) 10 MG tablet TAKE 1 TABLET DAILY  . aspirin EC 81 MG tablet Take 81 mg by mouth daily.  Marland Kitchen atorvastatin (LIPITOR) 20 MG  tablet Take 1 tablet (20 mg total) by mouth daily.  . fluticasone (FLONASE) 50 MCG/ACT nasal spray Place 1 spray into both nostrils daily.  . magnesium hydroxide (MILK OF MAGNESIA) 800 MG/5ML suspension Take 30 mLs by mouth as needed for constipation.  . montelukast (SINGULAIR) 10 MG tablet Take 10 mg by mouth at bedtime.  . Multiple Vitamins-Minerals (PRESERVISION AREDS) CAPS Take 2 capsules by mouth daily.   . naproxen (NAPROSYN) 500 MG tablet Take 1,000 mg by mouth daily as needed.  Marland Kitchen oxymetazoline (AFRIN) 0.05 % nasal spray Place 1  spray into both nostrils daily.  . pantoprazole (PROTONIX) 20 MG tablet Take 20 mg by mouth daily.  . propafenone (RYTHMOL) 150 MG tablet Take 225 mg by mouth 4 (four) times daily. 1.5 tablets 4 times daily breakfast, lunch, dinner and at bedtime   . tamsulosin (FLOMAX) 0.4 MG CAPS capsule Take 1 capsule (0.4 mg total) by mouth at bedtime.  Marland Kitchen VITAMIN D, ERGOCALCIFEROL, PO Take 1 tablet by mouth daily. 2,000 units once daily  . [DISCONTINUED] Elastic Bandages & Supports (MEDICAL COMPRESSION STOCKINGS) MISC 1 piar, wear it in the morning and remove it at bedtime to help with leg swelling   No facility-administered encounter medications on file as of 01/14/2018.     Review of Systems:  Review of Systems  Constitutional: Negative.   HENT: Positive for congestion. Negative for sinus pressure and sinus pain.   Cardiovascular: Negative.   Gastrointestinal: Negative.   Musculoskeletal: Negative.   Psychiatric/Behavioral: Negative.     Health Maintenance  Topic Date Due  . PNA vac Low Risk Adult (2 of 2 - PPSV23) 08/14/2018  . TETANUS/TDAP  08/14/2027  . INFLUENZA VACCINE  Completed    Physical Exam: Vitals:   01/14/18 0831  BP: 124/80  Pulse: 68  Temp: 97.6 F (36.4 C)  TempSrc: Oral  SpO2: 96%  Weight: 166 lb (75.3 kg)  Height: 5\' 9"  (1.753 m)   Body mass index is 24.51 kg/m. Physical Exam  Constitutional: He is oriented to person, place, and time. He appears well-developed and well-nourished.  HENT:  Head: Normocephalic.  Right Ear: External ear normal.  Left Ear: External ear normal.  Nose: Nose normal.  Mouth/Throat: Oropharynx is clear and moist.  Cardiovascular: Normal rate and regular rhythm.  Pulmonary/Chest: Effort normal and breath sounds normal.  Neurological: He is alert and oriented to person, place, and time.  Nursing note and vitals reviewed.   Labs reviewed: Basic Metabolic Panel: Recent Labs    04/08/17 0710 08/05/17 0705 12/03/17 0000  NA 140  141 140  K 4.2 4.5 4.2  CL 106 105 105  CO2 26 29 26   GLUCOSE 112* 113* 108*  BUN 29* 29* 29*  CREATININE 1.56* 1.64* 1.51*  CALCIUM 8.8 8.7 9.0  TSH 2.83  --  3.75   Liver Function Tests: Recent Labs    04/08/17 0710 08/05/17 0705 12/03/17 0000  AST 20 23 17   ALT 18 27 18   BILITOT 0.5 0.7 0.5  PROT 6.7 6.4 6.7   No results for input(s): LIPASE, AMYLASE in the last 8760 hours. No results for input(s): AMMONIA in the last 8760 hours. CBC: Recent Labs    04/08/17 0710 12/03/17 0000  WBC 7.6 7.1  NEUTROABS 4,233  --   HGB 13.1* 13.0*  HCT 37.4* 39.7  MCV 89.9 96.4  PLT 218 201   Lipid Panel: Recent Labs    04/08/17 0710 12/03/17 0000  CHOL 146 153  HDL  62 69  LDLCALC 70 70  TRIG 67 56  CHOLHDL 2.4 2.2   Lab Results  Component Value Date   HGBA1C 5.8 (H) 12/03/2017    Procedures since last visit: No results found.  Assessment/Plan 1. Sinus congestion I think given the longevity of symptoms this is not likely to be infectious.  The fact that symptoms are worse at night makes me think he is allergic to something in his bedroom presence or carpeting he may have had some infection from time to time exam today does not support the diagnosis try regular use of antihistamine and steroid nasal spray along with addition of Singulair.  Have also suggested possible use of vaporizer, and deep cleaning of the bedroom to remove dust.  There could be some mold or mildew in the duct work since they are living in an older cottage.Stanley Hunter in 1 month  Labs/tests ordered:  @ORDERS @ Next appt:  04/02/2018  Lillette Boxer. Sabra Heck, [place] [address] [place], [place] Office [phone]

## 2018-02-10 DIAGNOSIS — Z45010 Encounter for checking and testing of cardiac pacemaker pulse generator [battery]: Secondary | ICD-10-CM | POA: Diagnosis not present

## 2018-02-10 DIAGNOSIS — Z45018 Encounter for adjustment and management of other part of cardiac pacemaker: Secondary | ICD-10-CM | POA: Diagnosis not present

## 2018-02-11 ENCOUNTER — Non-Acute Institutional Stay: Payer: Medicare Other | Admitting: Family Medicine

## 2018-02-11 ENCOUNTER — Encounter: Payer: Self-pay | Admitting: Family Medicine

## 2018-02-11 DIAGNOSIS — J3089 Other allergic rhinitis: Secondary | ICD-10-CM | POA: Diagnosis not present

## 2018-02-11 DIAGNOSIS — J309 Allergic rhinitis, unspecified: Secondary | ICD-10-CM | POA: Insufficient documentation

## 2018-02-11 NOTE — Progress Notes (Signed)
Provider:  Alain Honey, MD Location:  [facility]   Place of Service:  Clinic (12)  PCP: Mast, Man X, NP Patient Care Team: Mast, Man X, NP as PCP - General (Internal Medicine) Mast, Man X, NP as Nurse Practitioner (Internal Medicine)  Extended Emergency Contact Information Primary Emergency Contact: Dauphin,[name] Address: [address]          [place],  [address] [place] Phone: [phone] Relation: Spouse  Code Status: DNR Goals of Care: Advanced Directive information Advanced Directives 02/11/2018  Does Patient Have a Medical Advance Directive? Yes  Type of Paramedic of [place];Out of facility DNR (pink MOST or yellow form);Living will  Does patient want to make changes to medical advance directive? No - Patient declined  Copy of [place] in Chart? Yes - validated most recent copy scanned in chart (See row information)  Pre-existing out of facility DNR order (yellow form or pink MOST form) Yellow form placed in chart (order not valid for inpatient use);Pink MOST form placed in chart (order not valid for inpatient use)      Chief Complaint  Patient presents with  . Medical Management of Chronic Issues    1 month follow up     HPI: Patient is a 84 y.o. male seen today for follow-up allergy symptoms.  He was seen in the clinic here about 1 month ago with complaint of sneezing and runny nose worse at night he is in his bedroom.  I suggested there may be some dust mold or mildew problems there but first we will try other medicine.  He had been on several antihistamines and I added Singulair.  He thinks the Singulair made his arrhythmia worse and stopped it.  He is however obtaining some relief by regular use of Flonase but he would like to pursue the issue further and see an allergist.  Past Medical History:  Diagnosis Date  . Acid reflux 08/03/2015  . Anxiety 08/03/2015  . Avitaminosis D 05/18/2012    . Benign essential HTN 02/09/2009   Overview:  Benign Essential Hypertension   . Benign prostatic hyperplasia with urinary obstruction 12/07/2013  . Chronic kidney disease (CKD), stage III (moderate) ([place]) 11/04/2011  . CN (constipation) 08/03/2015  . Cognitive changes 08/07/2015  . Degenerative arthritis of hip 05/14/2013  . Degenerative arthritis of spine 05/14/2013  . Degenerative disorder of muscle 08/03/2015  . Dysrhythmia 2001   ablation for a-flutter  . H/O atrial flutter 08/03/2015   Overview:  Pacemaker placed 2015   . High blood pressure   . High cholesterol   . History of pacemaker 2016  . Hx of cardiac cath 2013  . Loss of balance 08/07/2015  . Lumbar canal stenosis 07/07/2013   Overview:  Lumbar laminectomy 07/07/13 - Dr. Lurene Shadow   . Lumbar scoliosis 05/14/2013  . Macular degeneration 08/07/2015  . Premature contractions, supraventricular 12/15/2008   Overview:  Atrial Premature Complex   . Presence of permanent cardiac pacemaker   . Tremor 08/07/2015  . Urinary frequency 08/07/2015   Past Surgical History:  Procedure Laterality Date  . ATRIAL FLUTTER ABLATION  2001  . CATARACT EXTRACTION Bilateral 2015  . CHOLECYSTECTOMY    . MASTOIDECTOMY  1934   lower back   . PACEMAKER PLACEMENT  2016  . SPINE SURGERY  2016  . TONSILLECTOMY  1934  . TRIGGER FINGER RELEASE Left 12/14/2015   Procedure: RELEASE TRIGGER FINGER/A-1 PULLEY ring finger left;  Surgeon:  Daryll Brod, MD;  Location: [facility];  Service: Orthopedics;  Laterality: Left;  FAB    reports that he quit smoking about 42 years ago. His smoking use included cigarettes. He quit after 50.00 years of use. He has never used smokeless tobacco. He reports current alcohol use of about 3.0 standard drinks of alcohol per week. He reports that he does not use drugs. Social History   Socioeconomic History  . Marital status: Married    Spouse name: Not on file  . Number of children: Not on file  . Years of education:  Not on file  . Highest education level: Not on file  Occupational History  . Not on file  Social Needs  . Financial resource strain: Not hard at all  . Food insecurity:    Worry: Never true    Inability: Never true  . Transportation needs:    Medical: No    Non-medical: No  Tobacco Use  . Smoking status: Former Smoker    Years: 50.00    Types: Cigarettes    Last attempt to quit: 08/03/1975    Years since quitting: 42.5  . Smokeless tobacco: Never Used  . Tobacco comment: smoked 6 cig dialy   Substance and Sexual Activity  . Alcohol use: Yes    Alcohol/week: 3.0 standard drinks    Types: 3 Standard drinks or equivalent per week    Comment: 3 times a week  . Drug use: No  . Sexual activity: Not on file  Lifestyle  . Physical activity:    Days per week: 0 days    Minutes per session: 0 min  . Stress: Only a little  Relationships  . Social connections:    Talks on phone: Once a week    Gets together: More than three times a week    Attends religious service: Never    Active member of club or organization: No    Attends meetings of clubs or organizations: Never    Relationship status: Married  . Intimate partner violence:    Fear of current or ex partner: No    Emotionally abused: No    Physically abused: No    Forced sexual activity: No  Other Topics Concern  . Not on file  Social History Narrative   Lives at [facility]  Since 4 /16/2016 with wife [name]   Diet: n/a   Caffeine: Yes   Married: yes, 1957   House: Yes, 2 persons   Pets: no pets    Current/Past profession: Optometrist    Exercise: No   Living Will: Yes   DNR: Yes   POA/HPOA: Yes   Walks with walker    Functional Status Survey:    History reviewed. No pertinent family history.  Health Maintenance  Topic Date Due  . PNA vac Low Risk Adult (2 of 2 - PPSV23) 08/14/2018  . TETANUS/TDAP  08/14/2027  . INFLUENZA VACCINE  Completed    Allergies  Allergen Reactions  . Cortisone Other (See  Comments)  . Neomycin Rash  . Sulfamethoxazole-Trimethoprim Other (See Comments)    Leg weakness    Outpatient Encounter Medications as of 02/11/2018  Medication Sig  . ALPRAZolam (XANAX) 0.5 MG tablet Take 0.5 mg by mouth as needed.   Marland Kitchen amLODipine (NORVASC) 10 MG tablet TAKE 1 TABLET DAILY  . aspirin EC 81 MG tablet Take 81 mg by mouth daily.  Marland Kitchen atorvastatin (LIPITOR) 20 MG tablet Take 1 tablet (20 mg total)  by mouth daily.  . fluticasone (FLONASE) 50 MCG/ACT nasal spray Place 1 spray into both nostrils daily.  . magnesium hydroxide (MILK OF MAGNESIA) 800 MG/5ML suspension Take 30 mLs by mouth as needed for constipation.  . Multiple Vitamins-Minerals (PRESERVISION AREDS) CAPS Take 2 capsules by mouth daily.   . naproxen (NAPROSYN) 500 MG tablet Take 1,000 mg by mouth daily as needed.  Marland Kitchen oxymetazoline (AFRIN) 0.05 % nasal spray Place 1 spray into both nostrils daily.  . pantoprazole (PROTONIX) 20 MG tablet Take 20 mg by mouth daily.  . propafenone (RYTHMOL) 150 MG tablet Take 225 mg by mouth 4 (four) times daily. 1.5 tablets 4 times daily breakfast, lunch, dinner and at bedtime   . tamsulosin (FLOMAX) 0.4 MG CAPS capsule Take 1 capsule (0.4 mg total) by mouth at bedtime.  Marland Kitchen VITAMIN D, ERGOCALCIFEROL, PO Take 1 tablet by mouth daily. 2,000 units once daily  . montelukast (SINGULAIR) 10 MG tablet Take 1 tablet (10 mg total) by mouth at bedtime. (Patient not taking: Reported on 02/11/2018)   No facility-administered encounter medications on file as of 02/11/2018.     Review of Systems  Constitutional: Negative.   HENT: Positive for congestion, postnasal drip, rhinorrhea and sneezing.   All other systems reviewed and are negative.   Vitals:   02/11/18 0927  Weight: 165 lb 12.8 oz (75.2 kg)  Height: 5\' 9"  (1.753 m)   Body mass index is 24.48 kg/m. Physical Exam Constitutional:      Appearance: Normal appearance.  HENT:     Nose: Rhinorrhea present.  Pulmonary:     Effort:  Pulmonary effort is normal.     Breath sounds: Normal breath sounds.  Neurological:     Mental Status: He is alert.     Labs reviewed: Basic Metabolic Panel: Recent Labs    04/08/17 0710 08/05/17 0705 12/03/17 0000  NA 140 141 140  K 4.2 4.5 4.2  CL 106 105 105  CO2 26 29 26   GLUCOSE 112* 113* 108*  BUN 29* 29* 29*  CREATININE 1.56* 1.64* 1.51*  CALCIUM 8.8 8.7 9.0   Liver Function Tests: Recent Labs    04/08/17 0710 08/05/17 0705 12/03/17 0000  AST 20 23 17   ALT 18 27 18   BILITOT 0.5 0.7 0.5  PROT 6.7 6.4 6.7   No results for input(s): LIPASE, AMYLASE in the last 8760 hours. No results for input(s): AMMONIA in the last 8760 hours. CBC: Recent Labs    04/08/17 0710 12/03/17 0000  WBC 7.6 7.1  NEUTROABS 4,233  --   HGB 13.1* 13.0*  HCT 37.4* 39.7  MCV 89.9 96.4  PLT 218 201   Cardiac Enzymes: No results for input(s): CKTOTAL, CKMB, CKMBINDEX, TROPONINI in the last 8760 hours. BNP: Invalid input(s): POCBNP Lab Results  Component Value Date   HGBA1C 5.8 (H) 12/03/2017   Lab Results  Component Value Date   TSH 3.75 12/03/2017   Lab Results  Component Value Date   VITAMINB12 819 04/08/2017   No results found for: FOLATE No results found for: IRON, TIBC, FERRITIN  Imaging and Procedures obtained prior to SNF admission: Ct Lumbar Spine Wo Contrast  Result Date: 10/16/2016 CLINICAL DATA:  84 year old male with lumbar back pain, left leg numbness, spasms. Scoliosis, prior fusion. EXAM: CT LUMBAR SPINE WITHOUT CONTRAST TECHNIQUE: Multidetector CT imaging of the lumbar spine was performed without intravenous contrast administration. Multiplanar CT image reconstructions were also generated. COMPARISON:  Lumbar radiographs 09/25/2016. Chest radiographs 04/21/2016. FINDINGS: Segmentation:  Normal. Alignment: Stable from the recent lumbar radiographs. Moderate dextroconvex lumbar scoliosis. Straightening of lumbar lordosis, mild reversal of lordosis in the upper  lumbar spine. Vertebrae: Solid interbody ankylosis or arthrodesis at L1-L2. Diffuse bulky lower thoracic and lumbar endplate osteophytosis. Advanced lower lumbar facet degeneration. No acute osseous abnormality identified. Visible sacrum and SI joints intact. Paraspinal and other soft tissues: Aortoiliac calcified atherosclerosis. Negative visualized abdominal viscera. Diverticulosis in the distal colon. Partially visible urinary bladder distension. Negative paraspinal soft tissues. Disc levels: T11-T12: Relatively preserved disc height but vacuum disc. Anterior eccentric disc osteophyte complex. Suggestion of biforaminal, but no spinal stenosis. T12-L1: Diffuse vacuum disc. Right eccentric circumferential disc osteophyte complex. Mild to moderate right T12 foraminal stenosis but no significant spinal stenosis. L1-L2: Solid interbody fusion with left eccentric disc osteophyte complex. Mild to moderate left lateral recess stenosis (descending left L2 nerve root level), with no significant spinal stenosis. Mild to moderate osseous left L1 foraminal stenosis. L2-L3: Complete disc space loss with vacuum disc. Bulky circumferential disc osteophyte complex, broad-based posterior component. No significant spinal stenosis. Mild to moderate lateral recess stenosis greater on the left. Mild left greater than right L2 foraminal stenosis. L3-L4: Complete disc space loss. Vacuum disc. Bulky circumferential disc osteophyte complex with broad-based posterior component. No significant spinal stenosis. Possible mild to moderate bilateral lateral recess stenosis. Mild to moderate left and mild right L3 foraminal stenosis. L4-L5: Complete disc space loss. Vacuum disc. Bulky circumferential disc osteophyte complex. Broad-based posterior component. Previous right laminectomy. Moderate to severe facet hypertrophy with vacuum phenomena. Left ligament flavum hypertrophy or less likely synovial cyst (series 3, image 57). Moderate to severe  lateral recess stenosis suspected greater on the left. Mild to moderate spinal stenosis. Moderate to severe left and mild to moderate right L4 foraminal stenosis. L5-S1: Relatively preserved disc space. Mild circumferential disc bulge and endplate spurring. Mild to moderate facet hypertrophy. No significant stenosis. IMPRESSION: 1. No acute osseous abnormality identified. In the lumbar spine. Dextroconvex scoliosis and solid interbody fusion at L1-L2. 2. Previous right laminectomy at L4-L5. Advanced disc, endplate, and posterior element degeneration at that level with mild to moderate spinal, moderate to severe lateral recess and left neural foraminal stenosis. Query left L4 and/or L5 radiculitis. 3. Advanced lumbar disc and endplate degeneration elsewhere. No other significant spinal stenosis. Possible mild to moderate lateral recess stenosis on the left at L2-L3 and bilaterally at L3-L4. Mild to moderate left neural foraminal stenosis at the L1 through L3 nerve levels. 4.  Aortic Atherosclerosis (ICD10-I70.0). Electronically Signed   By: Genevie Ann M.D.   On: 10/16/2016 10:04    Assessment/Plan 1. Non-seasonal allergic rhinitis, unspecified trigger Continue using Flonase for now we will try to get consultation with allergist.  In my experience it is unusual to see this problem at this advanced age but will seek consultation   Family/ staff Communication:   Labs/tests ordered:  .smmsig

## 2018-03-13 DIAGNOSIS — J300 Vasomotor rhinitis: Secondary | ICD-10-CM | POA: Diagnosis not present

## 2018-03-13 DIAGNOSIS — J309 Allergic rhinitis, unspecified: Secondary | ICD-10-CM | POA: Diagnosis not present

## 2018-04-01 DIAGNOSIS — N183 Chronic kidney disease, stage 3 (moderate): Secondary | ICD-10-CM | POA: Diagnosis not present

## 2018-04-01 DIAGNOSIS — I129 Hypertensive chronic kidney disease with stage 1 through stage 4 chronic kidney disease, or unspecified chronic kidney disease: Secondary | ICD-10-CM | POA: Diagnosis not present

## 2018-04-01 DIAGNOSIS — R739 Hyperglycemia, unspecified: Secondary | ICD-10-CM | POA: Diagnosis not present

## 2018-04-01 DIAGNOSIS — R7303 Prediabetes: Secondary | ICD-10-CM | POA: Diagnosis not present

## 2018-04-01 DIAGNOSIS — E785 Hyperlipidemia, unspecified: Secondary | ICD-10-CM | POA: Diagnosis not present

## 2018-04-02 ENCOUNTER — Other Ambulatory Visit: Payer: Medicare Other

## 2018-04-02 DIAGNOSIS — N183 Chronic kidney disease, stage 3 unspecified: Secondary | ICD-10-CM

## 2018-04-02 DIAGNOSIS — I129 Hypertensive chronic kidney disease with stage 1 through stage 4 chronic kidney disease, or unspecified chronic kidney disease: Secondary | ICD-10-CM

## 2018-04-02 DIAGNOSIS — R7303 Prediabetes: Secondary | ICD-10-CM

## 2018-04-02 DIAGNOSIS — R739 Hyperglycemia, unspecified: Secondary | ICD-10-CM

## 2018-04-02 DIAGNOSIS — E785 Hyperlipidemia, unspecified: Secondary | ICD-10-CM

## 2018-04-03 LAB — CBC WITH DIFFERENTIAL/PLATELET
ABSOLUTE MONOCYTES: 911 {cells}/uL (ref 200–950)
BASOS PCT: 0.6 %
Basophils Absolute: 41 cells/uL (ref 0–200)
EOS ABS: 340 {cells}/uL (ref 15–500)
Eosinophils Relative: 5 %
HCT: 39 % (ref 38.5–50.0)
Hemoglobin: 12.9 g/dL — ABNORMAL LOW (ref 13.2–17.1)
Lymphs Abs: 1557 cells/uL (ref 850–3900)
MCH: 31.9 pg (ref 27.0–33.0)
MCHC: 33.1 g/dL (ref 32.0–36.0)
MCV: 96.5 fL (ref 80.0–100.0)
MONOS PCT: 13.4 %
MPV: 11.3 fL (ref 7.5–12.5)
Neutro Abs: 3951 cells/uL (ref 1500–7800)
Neutrophils Relative %: 58.1 %
Platelets: 182 10*3/uL (ref 140–400)
RBC: 4.04 10*6/uL — AB (ref 4.20–5.80)
RDW: 13.2 % (ref 11.0–15.0)
Total Lymphocyte: 22.9 %
WBC: 6.8 10*3/uL (ref 3.8–10.8)

## 2018-04-03 LAB — HEMOGLOBIN A1C
Hgb A1c MFr Bld: 6 % of total Hgb — ABNORMAL HIGH (ref <5.7)
Mean Plasma Glucose: 126 (calc)
eAG (mmol/L): 7 (calc)

## 2018-04-03 LAB — LIPID PANEL
CHOL/HDL RATIO: 2.5 (calc) (ref <5.0)
Cholesterol: 164 mg/dL (ref <200)
HDL: 66 mg/dL (ref >=40)
LDL Cholesterol (Calc): 84 mg/dL (calc)
NON-HDL CHOLESTEROL (CALC): 98 mg/dL (ref <130)
Triglycerides: 62 mg/dL (ref <150)

## 2018-04-03 LAB — TSH: TSH: 1.87 m[IU]/L (ref 0.40–4.50)

## 2018-04-09 ENCOUNTER — Non-Acute Institutional Stay: Payer: Medicare Other | Admitting: Nurse Practitioner

## 2018-04-09 ENCOUNTER — Encounter: Payer: Self-pay | Admitting: Nurse Practitioner

## 2018-04-09 DIAGNOSIS — I491 Atrial premature depolarization: Secondary | ICD-10-CM

## 2018-04-09 DIAGNOSIS — R7303 Prediabetes: Secondary | ICD-10-CM | POA: Diagnosis not present

## 2018-04-09 DIAGNOSIS — J3089 Other allergic rhinitis: Secondary | ICD-10-CM

## 2018-04-09 DIAGNOSIS — E785 Hyperlipidemia, unspecified: Secondary | ICD-10-CM | POA: Diagnosis not present

## 2018-04-09 DIAGNOSIS — F419 Anxiety disorder, unspecified: Secondary | ICD-10-CM

## 2018-04-09 DIAGNOSIS — K219 Gastro-esophageal reflux disease without esophagitis: Secondary | ICD-10-CM

## 2018-04-09 DIAGNOSIS — I129 Hypertensive chronic kidney disease with stage 1 through stage 4 chronic kidney disease, or unspecified chronic kidney disease: Secondary | ICD-10-CM

## 2018-04-09 DIAGNOSIS — N183 Chronic kidney disease, stage 3 unspecified: Secondary | ICD-10-CM

## 2018-04-09 NOTE — Patient Instructions (Signed)
F/u in clinic [facility] prn, pending ENT for allergic rhinitis.

## 2018-04-09 NOTE — Assessment & Plan Note (Signed)
Stable, continue Pantoprazole 20mg qd.  

## 2018-04-09 NOTE — Assessment & Plan Note (Addendum)
Persist, referred to ENT 04/21/18 Dr. Benjamine Mola by allergy, continue  Fluticasone daily.

## 2018-04-09 NOTE — Assessment & Plan Note (Signed)
Heart rate is in control, continue Ryhtmol 225mg  qid.

## 2018-04-09 NOTE — Assessment & Plan Note (Signed)
Stable, continue Alprazolam 0.5mg  prn.

## 2018-04-09 NOTE — Assessment & Plan Note (Signed)
Blood pressure is controlled, continue Amlodipien 10mg  qd.

## 2018-04-09 NOTE — Assessment & Plan Note (Addendum)
Continue diet exercise, diet control. Last Hgb a1c 6.0

## 2018-04-09 NOTE — Progress Notes (Signed)
Location:   clinic [place]   Place of Service:   clinic  Provider: Marlana Latus NP  Code Status: DNR Goals of Care: [place] Advanced Directives 02/11/2018  Does Patient Have a Medical Advance Directive? Yes  Type of Paramedic of [place];Out of facility DNR (pink MOST or yellow form);Living will  Does patient want to make changes to medical advance directive? No - Patient declined  Copy of [place] in Chart? Yes - validated most recent copy scanned in chart (See row information)  Pre-existing out of facility DNR order (yellow form or pink MOST form) Yellow form placed in chart (order not valid for inpatient use);Pink MOST form placed in chart (order not valid for inpatient use)     Chief Complaint  Patient presents with  . Medical Management of Chronic Issues    4 mo w/labs    HPI: Patient is a 85 y.o. male seen today for medical management of chronic diseases.    [place] [facility], Hx of supraventricular premature contractions,  heart rate is in control, on Rythmol 225mg  qid. GERD stable on Pantoprazole 20mg  qd. Allergic rhinitis, not  controlled off Singulair 10mg  qd, Fluticasone daily. HTN, blood pressure is controlled on Amlodipine 10mg  qd. Prn Alprazolam 0.5mg  prn for mood/anxiety occasionally.    Past Medical History:  Diagnosis Date  . Acid reflux 08/03/2015  . Anxiety 08/03/2015  . Avitaminosis D 05/18/2012  . Benign essential HTN 02/09/2009   Overview:  Benign Essential Hypertension   . Benign prostatic hyperplasia with urinary obstruction 12/07/2013  . Chronic kidney disease (CKD), stage III (moderate) ([place]) 11/04/2011  . CN (constipation) 08/03/2015  . Cognitive changes 08/07/2015  . Degenerative arthritis of hip 05/14/2013  . Degenerative arthritis of spine 05/14/2013  . Degenerative disorder of muscle 08/03/2015  . Dysrhythmia 2001   ablation for a-flutter  . H/O atrial flutter 08/03/2015   Overview:  Pacemaker placed 2015   .  High blood pressure   . High cholesterol   . History of pacemaker 2016  . Hx of cardiac cath 2013  . Loss of balance 08/07/2015  . Lumbar canal stenosis 07/07/2013   Overview:  Lumbar laminectomy 07/07/13 - Dr. Lurene Shadow   . Lumbar scoliosis 05/14/2013  . Macular degeneration 08/07/2015  . Premature contractions, supraventricular 12/15/2008   Overview:  Atrial Premature Complex   . Presence of permanent cardiac pacemaker   . Tremor 08/07/2015  . Urinary frequency 08/07/2015    Past Surgical History:  Procedure Laterality Date  . ATRIAL FLUTTER ABLATION  2001  . CATARACT EXTRACTION Bilateral 2015  . CHOLECYSTECTOMY    . MASTOIDECTOMY  1934   lower back   . PACEMAKER PLACEMENT  2016  . SPINE SURGERY  2016  . TONSILLECTOMY  1934  . TRIGGER FINGER RELEASE Left 12/14/2015   Procedure: RELEASE TRIGGER FINGER/A-1 PULLEY ring finger left;  Surgeon: Daryll Brod, MD;  Location: [facility];  Service: Orthopedics;  Laterality: Left;  FAB    Allergies  Allergen Reactions  . Cortisone Other (See Comments)  . Neomycin Rash  . Sulfamethoxazole-Trimethoprim Other (See Comments)    Leg weakness    Allergies as of 04/09/2018      Reactions   Cortisone Other (See Comments)   Neomycin Rash   Sulfamethoxazole-trimethoprim Other (See Comments)   Leg weakness      Medication List       Accurate as of April 09, 2018 11:59 PM. Always use  your most recent med list.        ALPRAZolam 0.5 MG tablet Commonly known as:  XANAX Take 0.5 mg by mouth as needed.   amLODipine 10 MG tablet Commonly known as:  NORVASC TAKE 1 TABLET DAILY   aspirin EC 81 MG tablet Take 81 mg by mouth daily. Taken on Mon  And Thurs   atorvastatin 20 MG tablet Commonly known as:  LIPITOR Take 1 tablet (20 mg total) by mouth daily.   fluticasone 50 MCG/ACT nasal spray Commonly known as:  FLONASE Place 1 spray into both nostrils daily.   magnesium hydroxide 800 MG/5ML suspension Commonly known as:   MILK OF MAGNESIA Take 30 mLs by mouth as needed for constipation.   naproxen 500 MG tablet Commonly known as:  NAPROSYN Take 1,000 mg by mouth daily as needed.   oxymetazoline 0.05 % nasal spray Commonly known as:  AFRIN Place 1 spray into both nostrils as needed.   pantoprazole 20 MG tablet Commonly known as:  PROTONIX Take 20 mg by mouth daily.   PRESERVISION AREDS Caps Take 2 capsules by mouth daily.   propafenone 150 MG tablet Commonly known as:  RYTHMOL Take 225 mg by mouth 4 (four) times daily. 1.5 tablets 4 times daily breakfast, lunch, dinner and at bedtime   tamsulosin 0.4 MG Caps capsule Commonly known as:  FLOMAX Take 1 capsule (0.4 mg total) by mouth at bedtime.   VITAMIN D (ERGOCALCIFEROL) PO Take 1 tablet by mouth daily. 2,000 units once daily       Review of Systems:  Review of Systems  Constitutional: Negative for activity change, appetite change, chills, diaphoresis, fatigue, fever and unexpected weight change.  HENT: Positive for congestion, hearing loss and postnasal drip. Negative for rhinorrhea, sinus pressure, sinus pain, trouble swallowing and voice change.   Eyes: Positive for visual disturbance.  Respiratory: Negative for cough, shortness of breath and wheezing.   Cardiovascular: Positive for leg swelling. Negative for chest pain and palpitations.  Gastrointestinal: Negative for abdominal distention, abdominal pain, constipation, diarrhea, nausea and vomiting.  Genitourinary: Positive for frequency. Negative for difficulty urinating, dysuria and urgency.  Musculoskeletal: Positive for back pain and gait problem.  Neurological: Positive for tremors. Negative for dizziness, speech difficulty and headaches.  Psychiatric/Behavioral: Negative for agitation, behavioral problems and hallucinations. The patient is nervous/anxious.     Health Maintenance  Topic Date Due  . PNA vac Low Risk Adult (2 of 2 - PPSV23) 08/14/2018  . TETANUS/TDAP  08/14/2027   . INFLUENZA VACCINE  Completed    Physical Exam: Vitals:   04/09/18 1450  BP: 112/60  Pulse: 68  Resp: 18  Temp: (!) 97.5 F (36.4 C)  TempSrc: Oral  SpO2: 91%  Weight: 166 lb 3.2 oz (75.4 kg)  Height: 5\' 9"  (1.753 m)   Body mass index is 24.54 kg/m. Physical Exam Constitutional:      General: He is not in acute distress.    Appearance: Normal appearance. He is not ill-appearing, toxic-appearing or diaphoretic.  HENT:     Head: Normocephalic and atraumatic.     Nose: Nose normal.     Mouth/Throat:     Mouth: Mucous membranes are moist.  Eyes:     Extraocular Movements: Extraocular movements intact.     Pupils: Pupils are equal, round, and reactive to light.  Neck:     Musculoskeletal: Normal range of motion and neck supple.  Cardiovascular:     Rate and Rhythm: Normal rate and regular  rhythm.     Heart sounds: No murmur.     Comments: Pace maker left chest.  Pulmonary:     Effort: Pulmonary effort is normal.     Breath sounds: No wheezing, rhonchi or rales.  Abdominal:     General: There is no distension.     Palpations: Abdomen is soft.     Tenderness: There is no abdominal tenderness. There is no guarding or rebound.  Musculoskeletal:     Right lower leg: Edema present.     Left lower leg: Edema present.     Comments: Trace edema BLE. Unsteady gait.   Skin:    General: Skin is warm and dry.  Neurological:     General: No focal deficit present.     Mental Status: He is alert and oriented to person, place, and time. Mental status is at baseline.     Motor: No weakness.     Coordination: Coordination normal.     Gait: Gait abnormal.  Psychiatric:        Mood and Affect: Mood normal.        Behavior: Behavior normal.        Thought Content: Thought content normal.        Judgment: Judgment normal.     Labs reviewed: Basic Metabolic Panel: Recent Labs    08/05/17 0705 12/03/17 0000 04/01/18 0000  NA 141 140  --   K 4.5 4.2  --   CL 105 105  --     CO2 29 26  --   GLUCOSE 113* 108*  --   BUN 29* 29*  --   CREATININE 1.64* 1.51*  --   CALCIUM 8.7 9.0  --   TSH  --  3.75 1.87   Liver Function Tests: Recent Labs    08/05/17 0705 12/03/17 0000  AST 23 17  ALT 27 18  BILITOT 0.7 0.5  PROT 6.4 6.7   No results for input(s): LIPASE, AMYLASE in the last 8760 hours. No results for input(s): AMMONIA in the last 8760 hours. CBC: Recent Labs    12/03/17 0000 04/01/18 0000  WBC 7.1 6.8  NEUTROABS  --  3,951  HGB 13.0* 12.9*  HCT 39.7 39.0  MCV 96.4 96.5  PLT 201 182   Lipid Panel: Recent Labs    12/03/17 0000 04/01/18 0000  CHOL 153 164  HDL 69 66  LDLCALC 70 84  TRIG 56 62  CHOLHDL 2.2 2.5   Lab Results  Component Value Date   HGBA1C 6.0 (H) 04/01/2018    Procedures since last visit: No results found.  Assessment/Plan  Premature contractions, supraventricular Heart rate is in control, continue Ryhtmol 225mg  qid.   Benign hypertension with chronic kidney disease, stage III (HCC) Blood pressure is controlled, continue Amlodipien 10mg  qd.   Acid reflux Stable, continue Pantoprazole 20mg  qd.   Allergic rhinitis Persist, referred to ENT 04/21/18 Dr. Benjamine Mola by allergy, continue  Fluticasone daily.   Anxiety Stable, continue Alprazolam 0.5mg  prn.   Prediabetes Continue diet exercise, diet control. Last Hgb a1c 6.0  Hyperlipidemia 04/01/18 cholesterol 164, triglycerides 62, HDL 66, LDL 84. No change of current therapy.    Labs/tests ordered: none  Next appt:  4 months

## 2018-04-11 ENCOUNTER — Encounter: Payer: Self-pay | Admitting: Nurse Practitioner

## 2018-04-11 NOTE — Assessment & Plan Note (Signed)
04/01/18 cholesterol 164, triglycerides 62, HDL 66, LDL 84. No change of current therapy.

## 2018-04-21 DIAGNOSIS — H6123 Impacted cerumen, bilateral: Secondary | ICD-10-CM | POA: Diagnosis not present

## 2018-04-21 DIAGNOSIS — J342 Deviated nasal septum: Secondary | ICD-10-CM | POA: Diagnosis not present

## 2018-04-21 DIAGNOSIS — J310 Chronic rhinitis: Secondary | ICD-10-CM | POA: Diagnosis not present

## 2018-04-21 DIAGNOSIS — J343 Hypertrophy of nasal turbinates: Secondary | ICD-10-CM | POA: Diagnosis not present

## 2018-05-01 ENCOUNTER — Other Ambulatory Visit: Payer: Self-pay | Admitting: *Deleted

## 2018-05-01 DIAGNOSIS — I129 Hypertensive chronic kidney disease with stage 1 through stage 4 chronic kidney disease, or unspecified chronic kidney disease: Secondary | ICD-10-CM

## 2018-05-01 DIAGNOSIS — N183 Chronic kidney disease, stage 3 (moderate): Principal | ICD-10-CM

## 2018-05-01 MED ORDER — AMLODIPINE BESYLATE 10 MG PO TABS: 10.0000 mg | ORAL_TABLET | Freq: Every day | ORAL | 0 refills | Status: DC

## 2018-05-18 DIAGNOSIS — L570 Actinic keratosis: Secondary | ICD-10-CM | POA: Diagnosis not present

## 2018-05-18 DIAGNOSIS — L853 Xerosis cutis: Secondary | ICD-10-CM | POA: Diagnosis not present

## 2018-05-18 DIAGNOSIS — L814 Other melanin hyperpigmentation: Secondary | ICD-10-CM | POA: Diagnosis not present

## 2018-05-26 DIAGNOSIS — Z950 Presence of cardiac pacemaker: Secondary | ICD-10-CM | POA: Diagnosis not present

## 2018-05-26 DIAGNOSIS — I10 Essential (primary) hypertension: Secondary | ICD-10-CM | POA: Diagnosis not present

## 2018-05-26 DIAGNOSIS — I491 Atrial premature depolarization: Secondary | ICD-10-CM | POA: Diagnosis not present

## 2018-05-26 DIAGNOSIS — I495 Sick sinus syndrome: Secondary | ICD-10-CM | POA: Diagnosis not present

## 2018-05-28 ENCOUNTER — Other Ambulatory Visit: Payer: Self-pay | Admitting: Family

## 2018-06-04 ENCOUNTER — Non-Acute Institutional Stay: Payer: Medicare Other | Admitting: Nurse Practitioner

## 2018-06-04 ENCOUNTER — Encounter: Payer: Self-pay | Admitting: Nurse Practitioner

## 2018-06-04 ENCOUNTER — Other Ambulatory Visit: Payer: Self-pay

## 2018-06-04 DIAGNOSIS — J3089 Other allergic rhinitis: Secondary | ICD-10-CM

## 2018-06-04 DIAGNOSIS — I129 Hypertensive chronic kidney disease with stage 1 through stage 4 chronic kidney disease, or unspecified chronic kidney disease: Secondary | ICD-10-CM

## 2018-06-04 DIAGNOSIS — J329 Chronic sinusitis, unspecified: Secondary | ICD-10-CM

## 2018-06-04 DIAGNOSIS — N183 Chronic kidney disease, stage 3 (moderate): Secondary | ICD-10-CM | POA: Diagnosis not present

## 2018-06-04 MED ORDER — AMOXICILLIN-POT CLAVULANATE 875-125 MG PO TABS: 1.0000 | ORAL_TABLET | Freq: Two times a day (BID) | ORAL | 0 refills | Status: AC

## 2018-06-04 NOTE — Patient Instructions (Addendum)
You are clinically diagnosed with Sinusitis, will treat with 7 day course of Augmentin 875mg  bid. Call if fever, SOB, chest pain/pressure, or increase swelling in leg. F/u clinic prn.

## 2018-06-04 NOTE — Progress Notes (Signed)
Location:   clinic [place]   Place of Service:  Clinic (12) Provider: Marlana Latus NP  Code Status: DNR Goals of Care: clinic Advanced Directives 02/11/2018  Does Patient Have a Medical Advance Directive? Yes  Type of Paramedic of [place];Out of facility DNR (pink MOST or yellow form);Living will  Does patient want to make changes to medical advance directive? No - Patient declined  Copy of [place] in Chart? Yes - validated most recent copy scanned in chart (See row information)  Pre-existing out of facility DNR order (yellow form or pink MOST form) Yellow form placed in chart (order not valid for inpatient use);Pink MOST form placed in chart (order not valid for inpatient use)     Chief Complaint  Patient presents with  . Acute Visit    C/o -sinus pressure, insomnia,     HPI: Patient is a 85 y.o. male seen today for nasal congestion, sinus pressure, insomnia. He denied recent travel, confirmed COVID 19 case exposure, he is afebrile, he denied generalized malaise or aches/pains. He denied sore throat or difficulty breathing. Hx of allergic rhinitis, on Flonase daily. HTN, blood pressure is controlled on Amlodipine 10mg  qd.    Past Medical History:  Diagnosis Date  . Acid reflux 08/03/2015  . Anxiety 08/03/2015  . Avitaminosis D 05/18/2012  . Benign essential HTN 02/09/2009   Overview:  Benign Essential Hypertension   . Benign prostatic hyperplasia with urinary obstruction 12/07/2013  . Chronic kidney disease (CKD), stage III (moderate) ([place]) 11/04/2011  . CN (constipation) 08/03/2015  . Cognitive changes 08/07/2015  . Degenerative arthritis of hip 05/14/2013  . Degenerative arthritis of spine 05/14/2013  . Degenerative disorder of muscle 08/03/2015  . Dysrhythmia 2001   ablation for a-flutter  . H/O atrial flutter 08/03/2015   Overview:  Pacemaker placed 2015   . High blood pressure   . High cholesterol   . History of pacemaker 2016  . Hx of  cardiac cath 2013  . Loss of balance 08/07/2015  . Lumbar canal stenosis 07/07/2013   Overview:  Lumbar laminectomy 07/07/13 - Dr. Lurene Shadow   . Lumbar scoliosis 05/14/2013  . Macular degeneration 08/07/2015  . Premature contractions, supraventricular 12/15/2008   Overview:  Atrial Premature Complex   . Presence of permanent cardiac pacemaker   . Tremor 08/07/2015  . Urinary frequency 08/07/2015    Past Surgical History:  Procedure Laterality Date  . ATRIAL FLUTTER ABLATION  2001  . CATARACT EXTRACTION Bilateral 2015  . CHOLECYSTECTOMY    . MASTOIDECTOMY  1934   lower back   . PACEMAKER PLACEMENT  2016  . SPINE SURGERY  2016  . TONSILLECTOMY  1934  . TRIGGER FINGER RELEASE Left 12/14/2015   Procedure: RELEASE TRIGGER FINGER/A-1 PULLEY ring finger left;  Surgeon: Daryll Brod, MD;  Location: [facility];  Service: Orthopedics;  Laterality: Left;  FAB    Allergies  Allergen Reactions  . Cortisone Other (See Comments)  . Neomycin Rash  . Sulfamethoxazole-Trimethoprim Other (See Comments)    Leg weakness    Allergies as of 06/04/2018      Reactions   Cortisone Other (See Comments)   Neomycin Rash   Sulfamethoxazole-trimethoprim Other (See Comments)   Leg weakness      Medication List       Accurate as of June 04, 2018  4:20 PM. Always use your most recent med list.        ALPRAZolam 0.5 MG tablet  Commonly known as:  XANAX Take 0.5 mg by mouth as needed.   amLODipine 10 MG tablet Commonly known as:  NORVASC Take 1 tablet (10 mg total) by mouth daily.   amoxicillin-clavulanate 875-125 MG tablet Commonly known as:  AUGMENTIN Take 1 tablet by mouth 2 (two) times daily for 7 days.   aspirin EC 81 MG tablet Take 81 mg by mouth daily. Taken on Mon  And Thurs   atorvastatin 20 MG tablet Commonly known as:  LIPITOR TAKE 1 TABLET DAILY   fluticasone 50 MCG/ACT nasal spray Commonly known as:  FLONASE Place 1 spray into both nostrils daily.   magnesium  hydroxide 800 MG/5ML suspension Commonly known as:  MILK OF MAGNESIA Take 30 mLs by mouth as needed for constipation.   naproxen 500 MG tablet Commonly known as:  NAPROSYN Take 1,000 mg by mouth daily as needed.   oxymetazoline 0.05 % nasal spray Commonly known as:  AFRIN Place 1 spray into both nostrils as needed.   pantoprazole 20 MG tablet Commonly known as:  PROTONIX Take 20 mg by mouth daily.   PreserVision AREDS Caps Take 2 capsules by mouth daily.   propafenone 150 MG tablet Commonly known as:  RYTHMOL Take 225 mg by mouth 4 (four) times daily. 1.5 tablets 4 times daily breakfast, lunch, dinner and at bedtime   tamsulosin 0.4 MG Caps capsule Commonly known as:  FLOMAX Take 1 capsule (0.4 mg total) by mouth at bedtime.   VITAMIN D (ERGOCALCIFEROL) PO Take 1 tablet by mouth daily. 2,000 units once daily       Review of Systems:  Review of Systems  Constitutional: Negative for activity change, appetite change, chills, diaphoresis, fatigue and fever.  HENT: Positive for hearing loss, postnasal drip, rhinorrhea and sinus pressure. Negative for congestion, ear pain, facial swelling, mouth sores, sinus pain, sneezing, sore throat and voice change.   Respiratory: Negative for cough, shortness of breath and wheezing.   Cardiovascular: Positive for leg swelling. Negative for chest pain and palpitations.  Gastrointestinal: Negative for abdominal distention, abdominal pain, constipation, diarrhea, nausea and vomiting.  Genitourinary: Negative for difficulty urinating, dysuria and urgency.  Musculoskeletal: Positive for back pain and gait problem.  Neurological: Negative for dizziness, facial asymmetry, speech difficulty, weakness and headaches.  Psychiatric/Behavioral: Positive for sleep disturbance. Negative for agitation, behavioral problems and hallucinations. The patient is nervous/anxious.     Health Maintenance  Topic Date Due  . PNA vac Low Risk Adult (2 of 2 -  PPSV23) 08/14/2018  . INFLUENZA VACCINE  09/26/2018  . TETANUS/TDAP  08/14/2027    Physical Exam: Vitals:   06/04/18 1357  BP: 132/62  Pulse: 71  Resp: 20  Temp: (!) 97.4 F (36.3 C)  TempSrc: Oral  SpO2: 92%  Weight: 169 lb 3.2 oz (76.7 kg)  Height: 5\' 9"  (1.753 m)   Body mass index is 24.99 kg/m. Physical Exam Constitutional:      General: He is not in acute distress.    Appearance: Normal appearance. He is not ill-appearing, toxic-appearing or diaphoretic.  HENT:     Head: Normocephalic and atraumatic.     Nose: Congestion and rhinorrhea present.     Right Sinus: Maxillary sinus tenderness present.     Left Sinus: Maxillary sinus tenderness present.     Mouth/Throat:     Mouth: Mucous membranes are moist.     Pharynx: Oropharynx is clear. No oropharyngeal exudate or posterior oropharyngeal erythema.     Tonsils: No tonsillar exudate.  Eyes:     Extraocular Movements: Extraocular movements intact.     Conjunctiva/sclera: Conjunctivae normal.     Pupils: Pupils are equal, round, and reactive to light.  Cardiovascular:     Rate and Rhythm: Normal rate and regular rhythm.     Heart sounds: No murmur.     Comments: pacemaker Pulmonary:     Effort: Pulmonary effort is normal.     Breath sounds: No wheezing, rhonchi or rales.  Abdominal:     General: There is no distension.     Palpations: Abdomen is soft.     Tenderness: There is no abdominal tenderness. There is no rebound.  Musculoskeletal:     Right lower leg: Edema present.     Left lower leg: Edema present.     Comments: Trace edema BLE  Skin:    General: Skin is warm and dry.  Neurological:     General: No focal deficit present.     Mental Status: He is alert and oriented to person, place, and time. Mental status is at baseline.     Cranial Nerves: No cranial nerve deficit.     Motor: No weakness.     Coordination: Coordination normal.     Gait: Gait abnormal.  Psychiatric:        Mood and Affect: Mood  normal.        Behavior: Behavior normal.        Thought Content: Thought content normal.        Judgment: Judgment normal.     Labs reviewed: Basic Metabolic Panel: Recent Labs    08/05/17 0705 12/03/17 0000 04/01/18 0000  NA 141 140  --   K 4.5 4.2  --   CL 105 105  --   CO2 29 26  --   GLUCOSE 113* 108*  --   BUN 29* 29*  --   CREATININE 1.64* 1.51*  --   CALCIUM 8.7 9.0  --   TSH  --  3.75 1.87   Liver Function Tests: Recent Labs    08/05/17 0705 12/03/17 0000  AST 23 17  ALT 27 18  BILITOT 0.7 0.5  PROT 6.4 6.7   No results for input(s): LIPASE, AMYLASE in the last 8760 hours. No results for input(s): AMMONIA in the last 8760 hours. CBC: Recent Labs    12/03/17 0000 04/01/18 0000  WBC 7.1 6.8  NEUTROABS  --  3,951  HGB 13.0* 12.9*  HCT 39.7 39.0  MCV 96.4 96.5  PLT 201 182   Lipid Panel: Recent Labs    12/03/17 0000 04/01/18 0000  CHOL 153 164  HDL 69 66  LDLCALC 70 84  TRIG 56 62  CHOLHDL 2.2 2.5   Lab Results  Component Value Date   HGBA1C 6.0 (H) 04/01/2018    Procedures since last visit: No results found.  Assessment/Plan  Allergic rhinitis Chronic, continue Flonase daily.   Benign hypertension with chronic kidney disease, stage III (HCC) Blood pressure is controlled, continue Amlodipine 10mg  qd.   Sinusitis Noted yellow nasal drainage, sinus pressure present. Augmentin 875 q12h x 7 days.    Labs/tests ordered:  None  Next appt:  08/20/2018

## 2018-06-04 NOTE — Assessment & Plan Note (Signed)
Chronic, continue Flonase daily.

## 2018-06-04 NOTE — Assessment & Plan Note (Signed)
Noted yellow nasal drainage, sinus pressure present. Augmentin 875 q12h x 7 days.

## 2018-06-04 NOTE — Assessment & Plan Note (Signed)
Blood pressure is controlled, continue Amlodipine 10mg  qd.

## 2018-06-16 ENCOUNTER — Other Ambulatory Visit: Payer: Self-pay | Admitting: *Deleted

## 2018-06-16 MED ORDER — PANTOPRAZOLE SODIUM 20 MG PO TBEC: 20.0000 mg | DELAYED_RELEASE_TABLET | Freq: Every day | ORAL | 1 refills | Status: DC

## 2018-06-16 NOTE — Telephone Encounter (Signed)
[facility]

## 2018-06-19 ENCOUNTER — Other Ambulatory Visit: Payer: Self-pay

## 2018-06-19 ENCOUNTER — Encounter: Payer: Self-pay | Admitting: Internal Medicine

## 2018-06-19 ENCOUNTER — Non-Acute Institutional Stay: Payer: Medicare Other | Admitting: Internal Medicine

## 2018-06-19 VITALS — BP 132/68 | HR 61 | Temp 98.3°F | Ht 69.0 in | Wt 161.6 lb

## 2018-06-19 DIAGNOSIS — R600 Localized edema: Secondary | ICD-10-CM | POA: Diagnosis not present

## 2018-06-19 DIAGNOSIS — N183 Chronic kidney disease, stage 3 unspecified: Secondary | ICD-10-CM

## 2018-06-19 DIAGNOSIS — J3089 Other allergic rhinitis: Secondary | ICD-10-CM

## 2018-06-19 DIAGNOSIS — R0981 Nasal congestion: Secondary | ICD-10-CM

## 2018-06-19 NOTE — Progress Notes (Addendum)
Location:  [place] [facility] Service:  Clinic (12)  Provider:   Code Status:  Goals of Care:  Advanced Directives 06/19/2018  Does Patient Have a Medical Advance Directive? Yes  Type of Paramedic of [place];Out of facility DNR (pink MOST or yellow form);Living will  Does patient want to make changes to medical advance directive? No - Patient declined  Copy of [place] in Chart? Yes - validated most recent copy scanned in chart (See row information)  Pre-existing out of facility DNR order (yellow form or pink MOST form) Yellow form placed in chart (order not valid for inpatient use);Pink MOST form placed in chart (order not valid for inpatient use)     Chief Complaint  Patient presents with  . Acute Visit    nasal congestion, was prescribed amoxiciilin and did not work    HPI: Patient is a 85 y.o. male seen today for medical management of chronic diseases.   Patient has h/o Hypertension, Sick Sinus Syndrome S/P Pacemaker in 2015, Hyperlipidemia. Mild Cognitive impairment, CKD, Anxiety Syndrome, BPH  He also has Chronic Allergic Rhinitis. He was seen by ENT and was diagnosed with Deviated septum with Turbinate Swelling. But his surgery planned was Postponed due to Covid. He is taking Flonase Prn and also Afrin spray but with nor relief. He was also given a antibiotics for a week in his last visit.  But he says there was no change in his symptoms.  He is not sleeping well.  He says it is hard for him to even eat due to continuous congestion and drainage from his nose.  Also had some swelling in his legs he said it is chronic.  He denied any cough shortness of breath or PND. His weight was actually Lower. Lives in [place] with his wife.  Walks with a walker.  Is independent in his ADLs and IADLs    Past Medical History:  Diagnosis Date  . Acid reflux 08/03/2015  . Anxiety 08/03/2015  . Avitaminosis D 05/18/2012  . Benign  essential HTN 02/09/2009   Overview:  Benign Essential Hypertension   . Benign prostatic hyperplasia with urinary obstruction 12/07/2013  . Chronic kidney disease (CKD), stage III (moderate) ([place]) 11/04/2011  . CN (constipation) 08/03/2015  . Cognitive changes 08/07/2015  . Degenerative arthritis of hip 05/14/2013  . Degenerative arthritis of spine 05/14/2013  . Degenerative disorder of muscle 08/03/2015  . Dysrhythmia 2001   ablation for a-flutter  . H/O atrial flutter 08/03/2015   Overview:  Pacemaker placed 2015   . High blood pressure   . High cholesterol   . History of pacemaker 2016  . Hx of cardiac cath 2013  . Loss of balance 08/07/2015  . Lumbar canal stenosis 07/07/2013   Overview:  Lumbar laminectomy 07/07/13 - Dr. Lurene Shadow   . Lumbar scoliosis 05/14/2013  . Macular degeneration 08/07/2015  . Premature contractions, supraventricular 12/15/2008   Overview:  Atrial Premature Complex   . Presence of permanent cardiac pacemaker   . Tremor 08/07/2015  . Urinary frequency 08/07/2015    Past Surgical History:  Procedure Laterality Date  . ATRIAL FLUTTER ABLATION  2001  . CATARACT EXTRACTION Bilateral 2015  . CHOLECYSTECTOMY    . MASTOIDECTOMY  1934   lower back   . PACEMAKER PLACEMENT  2016  . SPINE SURGERY  2016  . TONSILLECTOMY  1934  . TRIGGER FINGER RELEASE Left 12/14/2015   Procedure: RELEASE TRIGGER FINGER/A-1 PULLEY  ring finger left;  Surgeon: Daryll Brod, MD;  Location: [facility];  Service: Orthopedics;  Laterality: Left;  FAB    Allergies  Allergen Reactions  . Cortisone Other (See Comments)  . Neomycin Rash  . Sulfamethoxazole-Trimethoprim Other (See Comments)    Leg weakness    Outpatient Encounter Medications as of 06/19/2018  Medication Sig  . ALPRAZolam (XANAX) 0.5 MG tablet Take 0.5 mg by mouth as needed.   Marland Kitchen amLODipine (NORVASC) 10 MG tablet Take 1 tablet (10 mg total) by mouth daily.  Marland Kitchen aspirin EC 81 MG tablet Take 81 mg by mouth daily. Taken on  Mon  And Thurs  . atorvastatin (LIPITOR) 20 MG tablet TAKE 1 TABLET DAILY  . fluticasone (FLONASE) 50 MCG/ACT nasal spray Place 1 spray into both nostrils daily.  . magnesium hydroxide (MILK OF MAGNESIA) 800 MG/5ML suspension Take 30 mLs by mouth as needed for constipation.  . Multiple Vitamins-Minerals (PRESERVISION AREDS) CAPS Take 2 capsules by mouth daily.   . naproxen (NAPROSYN) 500 MG tablet Take 1,000 mg by mouth daily as needed.  Marland Kitchen oxymetazoline (AFRIN) 0.05 % nasal spray Place 1 spray into both nostrils as needed.   . pantoprazole (PROTONIX) 20 MG tablet Take 1 tablet (20 mg total) by mouth daily.  . propafenone (RYTHMOL) 150 MG tablet Take 225 mg by mouth 4 (four) times daily. 1.5 tablets 4 times daily breakfast, lunch, dinner and at bedtime   . tamsulosin (FLOMAX) 0.4 MG CAPS capsule Take 1 capsule (0.4 mg total) by mouth at bedtime.  Marland Kitchen VITAMIN D, ERGOCALCIFEROL, PO Take 1 tablet by mouth daily. 2,000 units once daily   No facility-administered encounter medications on file as of 06/19/2018.     Review of Systems:  Review of Systems  Constitutional: Positive for activity change and appetite change.  HENT: Positive for congestion, postnasal drip and rhinorrhea. Negative for sinus pressure and sinus pain.   Respiratory: Positive for cough and shortness of breath.   Cardiovascular: Positive for leg swelling.  Gastrointestinal: Positive for constipation.  Genitourinary: Negative.   Musculoskeletal: Negative.   Neurological: Negative.   Psychiatric/Behavioral: Negative.     Health Maintenance  Topic Date Due  . PNA vac Low Risk Adult (2 of 2 - PPSV23) 08/14/2018  . INFLUENZA VACCINE  09/26/2018  . TETANUS/TDAP  08/14/2027    Physical Exam: Vitals:   06/19/18 0927  BP: 132/68  Pulse: 61  Temp: 98.3 F (36.8 C)  TempSrc: Oral  SpO2: 97%  Weight: 161 lb 9.6 oz (73.3 kg)  Height: 5\' 9"  (1.753 m)   Body mass index is 23.86 kg/m. Physical Exam Vitals signs reviewed.   Constitutional:      Appearance: Normal appearance.  HENT:     Head: Normocephalic.     Comments: No sinus tenderness    Nose: Congestion present.     Comments: Swollen Left Turbinate with deviated septum    Mouth/Throat:     Mouth: Mucous membranes are moist.     Pharynx: Oropharynx is clear.  Cardiovascular:     Rate and Rhythm: Normal rate and regular rhythm.     Pulses: Normal pulses.     Heart sounds: Normal heart sounds.  Pulmonary:     Effort: Pulmonary effort is normal. No respiratory distress.     Breath sounds: Normal breath sounds. No wheezing.  Abdominal:     General: Abdomen is flat. Bowel sounds are normal.     Palpations: Abdomen is soft.  Musculoskeletal:  Comments: Moderate swelling bilateral  Skin:    General: Skin is warm and dry.  Neurological:     General: No focal deficit present.     Mental Status: He is alert and oriented to person, place, and time.  Psychiatric:        Mood and Affect: Mood normal.        Thought Content: Thought content normal.        Judgment: Judgment normal.     Labs reviewed: Basic Metabolic Panel: Recent Labs    08/05/17 0705 12/03/17 0000 04/01/18 0000  NA 141 140  --   K 4.5 4.2  --   CL 105 105  --   CO2 29 26  --   GLUCOSE 113* 108*  --   BUN 29* 29*  --   CREATININE 1.64* 1.51*  --   CALCIUM 8.7 9.0  --   TSH  --  3.75 1.87   Liver Function Tests: Recent Labs    08/05/17 0705 12/03/17 0000  AST 23 17  ALT 27 18  BILITOT 0.7 0.5  PROT 6.4 6.7   No results for input(s): LIPASE, AMYLASE in the last 8760 hours. No results for input(s): AMMONIA in the last 8760 hours. CBC: Recent Labs    12/03/17 0000 04/01/18 0000  WBC 7.1 6.8  NEUTROABS  --  3,951  HGB 13.0* 12.9*  HCT 39.7 39.0  MCV 96.4 96.5  PLT 201 182   Lipid Panel: Recent Labs    12/03/17 0000 04/01/18 0000  CHOL 153 164  HDL 69 66  LDLCALC 70 84  TRIG 56 62  CHOLHDL 2.2 2.5   Lab Results  Component Value Date   HGBA1C  6.0 (H) 04/01/2018    Procedures since last visit: No results found.  Assessment/Plan Inflamed Turbinate with deviated septum Continue Afrin spray and Flonase I suggested patient to restart Zyrtec but he is not very sure at this time. His wife is going to call Dr. Benjamine Mola to see if the surgery can be done early. Bilateral lower extremity swelling Per patient this is not new Will not change his Norvasc at this time.   I offered to start him on a low-dose of diuretics but he is not interested. Hypertension  controlled on Norvasc HyperLipidemia LDL 84 Continue Lipitor 20 CKD stage III We will repeat his BMP BPH Continue Flomax Sick sinus syndrome status post pacemaker placement On propafenone GAD On Xanax prn   Labs/tests ordered Before his Appointment CMP, CBC, Lipid Panel,TSh Next appt:  08/20/2018   Total time spent in this patient care encounter was  40_  minutes; greater than 50% of the visit spent counseling patient and staff, reviewing records , Labs and coordinating care for problems addressed at this encounter.

## 2018-06-21 ENCOUNTER — Other Ambulatory Visit: Payer: Self-pay | Admitting: Nurse Practitioner

## 2018-07-26 ENCOUNTER — Other Ambulatory Visit: Payer: Self-pay | Admitting: Nurse Practitioner

## 2018-07-26 DIAGNOSIS — I129 Hypertensive chronic kidney disease with stage 1 through stage 4 chronic kidney disease, or unspecified chronic kidney disease: Secondary | ICD-10-CM

## 2018-08-04 ENCOUNTER — Emergency Department (HOSPITAL_COMMUNITY)
Admission: EM | Admit: 2018-08-04 | Discharge: 2018-08-04 | Disposition: A | Discharge status: Home / Self Care | Payer: Medicare Other | Attending: Emergency Medicine | Admitting: Emergency Medicine

## 2018-08-04 ENCOUNTER — Telehealth: Payer: Self-pay | Admitting: *Deleted

## 2018-08-04 DIAGNOSIS — N183 Chronic kidney disease, stage 3 (moderate): Secondary | ICD-10-CM | POA: Insufficient documentation

## 2018-08-04 DIAGNOSIS — I129 Hypertensive chronic kidney disease with stage 1 through stage 4 chronic kidney disease, or unspecified chronic kidney disease: Secondary | ICD-10-CM | POA: Insufficient documentation

## 2018-08-04 DIAGNOSIS — Z79899 Other long term (current) drug therapy: Secondary | ICD-10-CM | POA: Diagnosis not present

## 2018-08-04 DIAGNOSIS — J309 Allergic rhinitis, unspecified: Secondary | ICD-10-CM | POA: Insufficient documentation

## 2018-08-04 DIAGNOSIS — I4891 Unspecified atrial fibrillation: Secondary | ICD-10-CM | POA: Diagnosis not present

## 2018-08-04 DIAGNOSIS — R0981 Nasal congestion: Secondary | ICD-10-CM | POA: Diagnosis present

## 2018-08-04 MED ORDER — LORATADINE 10 MG PO TABS
10.0000 mg | ORAL_TABLET | Freq: Once | ORAL | Status: AC
  Administered 2018-08-04: 10 mg via ORAL
  Filled 2018-08-04: qty 1

## 2018-08-04 MED ORDER — LORATADINE 10 MG PO TABS: 10.0000 mg | ORAL_TABLET | Freq: Every day | ORAL | 0 refills | Status: DC

## 2018-08-04 NOTE — Telephone Encounter (Signed)
[name], Wife called and stated that she wants to speak with the Provider regarding patient's Deviated Septum. Stated that he cannot have surgery until August 18 with Dr. Benjamine Mola. Stated that patient's nasal passage keeps getting filled up and he is having trouble sleeping and eating. Wife wants to speak with one of you.   Call: [phone]

## 2018-08-04 NOTE — ED Provider Notes (Signed)
[facility] EMERGENCY DEPARTMENT Provider Note   CSN: [id number] Arrival date & time: 08/04/18  8466    History   Chief Complaint Chief Complaint  Patient presents with  . Nasal Congestion    HPI Stanley Hunter is a 85 y.o. male.     85 y.o male with an extensive PMH including chronic allergic rhinitis, CKD, A. fib presents to the ED with a chief complaint of nasal congestion times several months.  Patient reports he had a fall about 20 years ago resulted in a deviated septum, he was scheduled for surgery prior to the pandemic outbreak, however, the surgery is now scheduled for August 3.  Patient has been using Afrin daily, reports last using it yesterday, states he was using it 3-4 times a day but has decreased its use.  He denies any shortness of breath, chest pain, fevers, headache or other complaints.  The history is provided by the patient and medical records.    Past Medical History:  Diagnosis Date  . Acid reflux 08/03/2015  . Anxiety 08/03/2015  . Avitaminosis D 05/18/2012  . Benign essential HTN 02/09/2009   Overview:  Benign Essential Hypertension   . Benign prostatic hyperplasia with urinary obstruction 12/07/2013  . Chronic kidney disease (CKD), stage III (moderate) ([place]) 11/04/2011  . CN (constipation) 08/03/2015  . Cognitive changes 08/07/2015  . Degenerative arthritis of hip 05/14/2013  . Degenerative arthritis of spine 05/14/2013  . Degenerative disorder of muscle 08/03/2015  . Dysrhythmia 2001   ablation for a-flutter  . H/O atrial flutter 08/03/2015   Overview:  Pacemaker placed 2015   . High blood pressure   . High cholesterol   . History of pacemaker 2016  . Hx of cardiac cath 2013  . Loss of balance 08/07/2015  . Lumbar canal stenosis 07/07/2013   Overview:  Lumbar laminectomy 07/07/13 - Dr. Lurene Shadow   . Lumbar scoliosis 05/14/2013  . Macular degeneration 08/07/2015  . Premature contractions, supraventricular 12/15/2008   Overview:  Atrial Premature  Complex   . Presence of permanent cardiac pacemaker   . Tremor 08/07/2015  . Urinary frequency 08/07/2015    Patient Active Problem List   Diagnosis Date Noted  . Bilateral leg edema 06/19/2018  . Allergic rhinitis 02/11/2018  . Sinus congestion 01/14/2018  . 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 Hyperlipidemia 08/12/2017  . Prediabetes 04/15/2017  . PND (post-nasal drip) 04/15/2017  . Mild cognitive impairment 04/15/2017  . Hypertensive heart and renal disease 01/14/2017  . Anemia of chronic disease 01/14/2017  . Sinusitis 07/25/2016  . Lipoma 05/09/2016  . Influenza B 04/21/2016  . Edema 02/01/2016  . Pain 11/24/2015  . Trigger ring finger of left hand 11/16/2015  . Tremor 08/07/2015  . Loss of balance 08/07/2015  . Macular degeneration 08/07/2015  . Urinary frequency 08/07/2015  . Cognitive impairment 08/07/2015  . History of pacemaker   . Anxiety 08/03/2015  . Constipation 08/03/2015  . Acid reflux 08/03/2015  . H/O atrial flutter 08/03/2015  . Myalgia 08/03/2015  . Benign prostatic hyperplasia with urinary frequency 12/07/2013  . Sick sinus syndrome ([place]) 11/18/2013  . Spinal stenosis of lumbar region with neurogenic claudication 07/07/2013  . Degenerative arthritis of hip 05/14/2013  . Degenerative arthritis of spine 05/14/2013  . Lumbar scoliosis 05/14/2013  . Avitaminosis D 05/18/2012  . CKD (chronic kidney disease) stage 3, GFR 30-59 ml/min ([facility]) 11/04/2011  . Benign hypertension with chronic kidney disease, stage III ([place])  02/09/2009  . Premature contractions, supraventricular 12/15/2008  . Pure hypercholesterolemia 10/08/2006    Past Surgical History:  Procedure Laterality Date  . ATRIAL FLUTTER ABLATION  2001  . CATARACT EXTRACTION Bilateral 2015  . CHOLECYSTECTOMY    . MASTOIDECTOMY  1934   lower back   . PACEMAKER PLACEMENT  2016  . SPINE SURGERY  2016  . TONSILLECTOMY  1934  . TRIGGER FINGER RELEASE Left  12/14/2015   Procedure: RELEASE TRIGGER FINGER/A-1 PULLEY ring finger left;  Surgeon: Daryll Brod, MD;  Location: [facility];  Service: Orthopedics;  Laterality: Left;  FAB        Home Medications    Prior to Admission medications   Medication Sig Start Date End Date Taking? Authorizing Provider  ALPRAZolam Duanne Moron) 0.5 MG tablet Take 0.5 mg by mouth as needed.    Yes [provider]  amLODipine (NORVASC) 10 MG tablet TAKE 1 TABLET DAILY Patient taking differently: Take 10 mg by mouth daily.  07/27/18  Yes Mast, Man X, NP  aspirin EC 81 MG tablet Take 81 mg by mouth See admin instructions. Taken on Mon  And Thurs   Yes [provider]  atorvastatin (LIPITOR) 20 MG tablet TAKE 1 TABLET DAILY Patient taking differently: Take 20 mg by mouth daily at 6 PM.  05/28/18  Yes Mast, Man X, NP  magnesium hydroxide (MILK OF MAGNESIA) 800 MG/5ML suspension Take 30 mLs by mouth as needed for constipation.   Yes [provider]  Multiple Vitamins-Minerals (PRESERVISION AREDS) CAPS Take 1 capsule by mouth 2 (two) times a day.    Yes [provider]  naproxen (NAPROSYN) 500 MG tablet Take 500 mg by mouth daily as needed.    Yes [provider]  oxymetazoline (AFRIN) 0.05 % nasal spray Place 1 spray into both nostrils as needed.    Yes [provider]  pantoprazole (PROTONIX) 20 MG tablet Take 1 tablet (20 mg total) by mouth daily. Patient taking differently: Take 20 mg by mouth daily. noon 06/16/18  Yes Mast, Man X, NP  propafenone (RYTHMOL) 150 MG tablet Take 225 mg by mouth 4 (four) times daily. 1.5 tablets 4 times daily breakfast, lunch, dinner and at bedtime    Yes [provider]  tamsulosin (FLOMAX) 0.4 MG CAPS capsule TAKE 1 CAPSULE AT BEDTIME Patient taking differently: Take 0.4 mg by mouth daily after supper.  06/22/18  Yes Virgie Dad, MD  VITAMIN D, ERGOCALCIFEROL, PO Take 5,000 Units by mouth daily at 12 noon.    Yes  [provider]  loratadine (CLARITIN) 10 MG tablet Take 1 tablet (10 mg total) by mouth daily for 15 days. 08/04/18 08/19/18  Janeece Fitting, PA-C    Family History No family history on file.  Social History Social History   Tobacco Use  . Smoking status: Former Smoker    Years: 50.00    Types: Cigarettes    Last attempt to quit: 08/03/1975    Years since quitting: 43.0  . Smokeless tobacco: Never Used  . Tobacco comment: smoked 6 cig dialy   Substance Use Topics  . Alcohol use: Yes    Alcohol/week: 3.0 standard drinks    Types: 3 Standard drinks or equivalent per week    Comment: 3 times a week  . Drug use: No     Allergies   Cortisone; Neomycin; and Sulfamethoxazole-trimethoprim   Review of Systems Review of Systems  Constitutional: Negative for chills and fever.  HENT: Positive  for rhinorrhea and sneezing. Negative for ear pain, sinus pressure, sinus pain and sore throat.   Eyes: Negative for pain and visual disturbance.  Respiratory: Negative for cough and shortness of breath.   Cardiovascular: Negative for chest pain and palpitations.  Gastrointestinal: Negative for abdominal pain and vomiting.  Genitourinary: Negative for dysuria and hematuria.  Musculoskeletal: Negative for arthralgias and back pain.  Skin: Negative for color change and rash.  Neurological: Negative for seizures and syncope.  All other systems reviewed and are negative.    Physical Exam Updated Vital Signs BP (!) 149/72   Pulse 65   Temp (!) 97.3 F (36.3 C) (Oral)   Resp 16   SpO2 97%   Physical Exam Vitals signs and nursing note reviewed.  Constitutional:      Appearance: He is well-developed.  HENT:     Head: Normocephalic and atraumatic.     Nose:     Right Turbinates: Enlarged and swollen.     Left Turbinates: Enlarged and swollen.     Right Sinus: No maxillary sinus tenderness or frontal sinus tenderness.     Left Sinus: No maxillary sinus tenderness or frontal sinus  tenderness.     Comments: No sinus tenderness along the frontal or maxillary region.  No epistaxis. Eyes:     General: No scleral icterus.    Pupils: Pupils are equal, round, and reactive to light.  Neck:     Musculoskeletal: Normal range of motion.  Cardiovascular:     Heart sounds: Normal heart sounds.  Pulmonary:     Effort: Pulmonary effort is normal.     Breath sounds: Normal breath sounds. No wheezing.  Chest:     Chest wall: No tenderness.  Abdominal:     General: Bowel sounds are normal. There is no distension.     Palpations: Abdomen is soft.     Tenderness: There is no abdominal tenderness.  Musculoskeletal:        General: No tenderness or deformity.  Skin:    General: Skin is warm and dry.  Neurological:     Mental Status: He is alert and oriented to person, place, and time.      ED Treatments / Results  Labs (all labs ordered are listed, but only abnormal results are displayed) Labs Reviewed - No data to display  EKG None  Radiology No results found.  Procedures Procedures (including critical care time)  Medications Ordered in ED Medications  loratadine (CLARITIN) tablet 10 mg (10 mg Oral Given 08/04/18 1024)     Initial Impression / Assessment and Plan / ED Course  I have reviewed the triage vital signs and the nursing notes.  Pertinent labs & imaging results that were available during my care of the patient were reviewed by me and considered in my medical decision making (see chart for details).    Patient with an extensive past medical history presents to the ED with complaints of nasal congestion times several months.  Previous history of chronic allergic rhinitis, seen for this complaint in April 2020, was scheduled for surgery prior to pandemic outbreak but now his surgery has been moved for his deviated septum to September 28, 2018.  Patient has been using daily Afrin along with Flonase without improvement in symptoms, states he recently stopped  using Afrin as much as he reports this was causing his symptoms to worsen.  During examination patient's nasal congestion is observed during exam, turbinates look swollen and enlarged.  No epistaxis, no foreign  body noted.  Attempts to have nasal suction in the ED by RT, patient reports not much relieve from suctioning. He ultimate will be to have his surgery moved up, advised to consult Dr. Benjamine Mola. Vital signs are within normal limits, no tachycardia or hypoxia.  Will provide patient with Claritin during ED visit, he is to take this daily. I have discussed this patient with Dr. Regenia Skeeter who has seen patient and agrees with management at this time. No further evaluation warranted. Encouraged follow up with PCP. Return precautions provided.   Portions of this note were generated with [employer]. Dictation errors may occur despite best attempts at proofreading.   Final Clinical Impressions(s) / ED Diagnoses   Final diagnoses:  Chronic allergic rhinitis    ED Discharge Orders         Ordered    loratadine (CLARITIN) 10 MG tablet  Daily     08/04/18 0929           Janeece Fitting, PA-C 08/04/18 1047    Sherwood Gambler, MD 08/06/18 [phone]

## 2018-08-04 NOTE — Progress Notes (Signed)
NTS pt per MD. Pt tolerated it well.

## 2018-08-04 NOTE — ED Triage Notes (Signed)
Pt endorses nasal blockage with congestion for several months due to a septum problem caused by a fall last year. VSS. Denies CP, shob, cough or any covid sx or exposure.

## 2018-08-04 NOTE — Telephone Encounter (Signed)
Mast, Man X, NP  You 5 minutes ago (2:52 PM)    Can you change his appointment to 08/06/18 in clinic [place] to address his concerns?   Routing comment

## 2018-08-04 NOTE — ED Notes (Signed)
ED Provider at bedside. 

## 2018-08-04 NOTE — Discharge Instructions (Addendum)
I have prescribed a daily antihistamine,Claritin please take this medication every day. You may also purchase this medication over the counter or Zyrtec. You may continue using your Flonase, avoid Afrin if possible as this could cause a rebound congestion in your sinuses.  Please follow-up with your primary care as needed.

## 2018-08-04 NOTE — Telephone Encounter (Signed)
Patient wife notified and appointment scheduled.

## 2018-08-06 ENCOUNTER — Non-Acute Institutional Stay: Payer: Medicare Other | Admitting: Nurse Practitioner

## 2018-08-06 ENCOUNTER — Other Ambulatory Visit: Payer: Self-pay

## 2018-08-06 ENCOUNTER — Encounter: Payer: Self-pay | Admitting: Nurse Practitioner

## 2018-08-06 VITALS — BP 128/60 | HR 60 | Temp 98.7°F | Ht 69.0 in | Wt 162.2 lb

## 2018-08-06 DIAGNOSIS — R350 Frequency of micturition: Secondary | ICD-10-CM | POA: Diagnosis not present

## 2018-08-06 DIAGNOSIS — K219 Gastro-esophageal reflux disease without esophagitis: Secondary | ICD-10-CM

## 2018-08-06 DIAGNOSIS — R0981 Nasal congestion: Secondary | ICD-10-CM | POA: Diagnosis not present

## 2018-08-06 MED ORDER — DOXYCYCLINE HYCLATE 100 MG PO TABS: 100.0000 mg | ORAL_TABLET | Freq: Two times a day (BID) | ORAL | 0 refills | Status: AC

## 2018-08-06 NOTE — Patient Instructions (Addendum)
Will start Astelin nasal spray bid, Doxycycline 100mg  bid x 14 days, FloraStor bid x 2 weeks. F/u in clinic [facility] 4 months.

## 2018-08-06 NOTE — Progress Notes (Signed)
Location:   clinic [place]   Place of Service:  Clinic (12) Provider: Marlana Latus NP  Code Status: DNR Goals of Care: [place] 08/06/2018  Does Patient Have a Medical Advance Directive? Yes  Type of Advance Directive Living will;Out of facility DNR (pink MOST or yellow form);Healthcare Power of Attorney  Does patient want to make changes to medical advance directive? No - Patient declined  Copy of [place] in Chart? Yes - validated most recent copy scanned in chart (See row information)  Pre-existing out of facility DNR order (yellow form or pink MOST form) Yellow form placed in chart (order not valid for inpatient use);Pink MOST form placed in chart (order not valid for inpatient use)     Chief Complaint  Patient presents with  . Medical Management of Chronic Issues    follow up with deviated septum, and would like a referral    HPI: Patient is a 85 y.o. male seen today for an acute visit for nasal congestion, postponed deviated nasal septum. Prn Afrin, daily Claritin 10mg  are not adequate in symptomatic control. The patient stated there are some yellow drainage seen. He denied facial pressure, headache, chest pain/pressure, palpitation, or cough.   Flonase-corticosteroids-caused palpitation, options are: Astelin-antihistamine, ABT. Hx of GERD, on Protonix 20mg  qd. BHP on Tamsulosin 0.4mg  qd  Past Medical History:  Diagnosis Date  . Acid reflux 08/03/2015  . Anxiety 08/03/2015  . Avitaminosis D 05/18/2012  . Benign essential HTN 02/09/2009   Overview:  Benign Essential Hypertension   . Benign prostatic hyperplasia with urinary obstruction 12/07/2013  . Chronic kidney disease (CKD), stage III (moderate) ([place]) 11/04/2011  . CN (constipation) 08/03/2015  . Cognitive changes 08/07/2015  . Degenerative arthritis of hip 05/14/2013  . Degenerative arthritis of spine 05/14/2013  . Degenerative disorder of muscle 08/03/2015  . Dysrhythmia 2001   ablation for  a-flutter  . H/O atrial flutter 08/03/2015   Overview:  Pacemaker placed 2015   . High blood pressure   . High cholesterol   . History of pacemaker 2016  . Hx of cardiac cath 2013  . Loss of balance 08/07/2015  . Lumbar canal stenosis 07/07/2013   Overview:  Lumbar laminectomy 07/07/13 - Dr. Lurene Shadow   . Lumbar scoliosis 05/14/2013  . Macular degeneration 08/07/2015  . Premature contractions, supraventricular 12/15/2008   Overview:  Atrial Premature Complex   . Presence of permanent cardiac pacemaker   . Tremor 08/07/2015  . Urinary frequency 08/07/2015    Past Surgical History:  Procedure Laterality Date  . ATRIAL FLUTTER ABLATION  2001  . CATARACT EXTRACTION Bilateral 2015  . CHOLECYSTECTOMY    . MASTOIDECTOMY  1934   lower back   . PACEMAKER PLACEMENT  2016  . SPINE SURGERY  2016  . TONSILLECTOMY  1934  . TRIGGER FINGER RELEASE Left 12/14/2015   Procedure: RELEASE TRIGGER FINGER/A-1 PULLEY ring finger left;  Surgeon: Daryll Brod, MD;  Location: [facility];  Service: Orthopedics;  Laterality: Left;  FAB    Allergies  Allergen Reactions  . Cortisone Other (See Comments)  . Neomycin Rash  . Sulfamethoxazole-Trimethoprim Other (See Comments)    Leg weakness    Allergies as of 08/06/2018      Reactions   Cortisone Other (See Comments)   Neomycin Rash   Sulfamethoxazole-trimethoprim Other (See Comments)   Leg weakness      Medication List       Accurate as of August 06, 2018  4:36 PM. If you have any questions, ask your nurse or doctor.        ALPRAZolam 0.5 MG tablet Commonly known as: XANAX Take 0.5 mg by mouth as needed.   amLODipine 10 MG tablet Commonly known as: NORVASC TAKE 1 TABLET DAILY   aspirin EC 81 MG tablet Take 81 mg by mouth See admin instructions. Taken on Mon  And Thurs   atorvastatin 20 MG tablet Commonly known as: LIPITOR TAKE 1 TABLET DAILY What changed: when to take this   doxycycline 100 MG tablet Commonly known as:  VIBRA-TABS Take 1 tablet (100 mg total) by mouth 2 (two) times daily for 14 days. Started by: Aveah Castell X Huber Mathers, NP   loratadine 10 MG tablet Commonly known as: CLARITIN Take 1 tablet (10 mg total) by mouth daily for 15 days.   magnesium hydroxide 800 MG/5ML suspension Commonly known as: MILK OF MAGNESIA Take 30 mLs by mouth as needed for constipation.   naproxen 500 MG tablet Commonly known as: NAPROSYN Take 500 mg by mouth daily as needed.   oxymetazoline 0.05 % nasal spray Commonly known as: AFRIN Place 1 spray into both nostrils as needed.   pantoprazole 20 MG tablet Commonly known as: PROTONIX Take 1 tablet (20 mg total) by mouth daily. What changed: additional instructions   PreserVision AREDS Caps Take 1 capsule by mouth 2 (two) times a day.   propafenone 150 MG tablet Commonly known as: RYTHMOL Take 225 mg by mouth 4 (four) times daily. 1.5 tablets 4 times daily breakfast, lunch, dinner and at bedtime   tamsulosin 0.4 MG Caps capsule Commonly known as: FLOMAX TAKE 1 CAPSULE AT BEDTIME What changed: when to take this   VITAMIN D (ERGOCALCIFEROL) PO Take 5,000 Units by mouth daily at 12 noon.       Review of Systems:  Review of Systems  Constitutional: Negative for activity change, appetite change, chills, diaphoresis, fatigue and fever.  HENT: Positive for congestion, hearing loss, postnasal drip and rhinorrhea. Negative for facial swelling, mouth sores, nosebleeds, sinus pressure, sinus pain, sneezing, sore throat, tinnitus, trouble swallowing and voice change.        Yellow nasal drainage.   Eyes: Negative for visual disturbance.  Respiratory: Positive for cough. Negative for shortness of breath and wheezing.   Cardiovascular: Positive for leg swelling. Negative for chest pain and palpitations.  Gastrointestinal: Negative for abdominal distention, abdominal pain, constipation, diarrhea, nausea and vomiting.  Genitourinary: Negative for difficulty urinating,  dysuria and urgency.  Musculoskeletal: Positive for gait problem.  Skin: Negative for color change and pallor.  Neurological: Negative for dizziness, tremors, facial asymmetry, speech difficulty, weakness, light-headedness and headaches.  Psychiatric/Behavioral: Negative for agitation, behavioral problems, hallucinations and sleep disturbance. The patient is not nervous/anxious.     Health Maintenance  Topic Date Due  . PNA vac Low Risk Adult (2 of 2 - PPSV23) 08/14/2018  . INFLUENZA VACCINE  09/26/2018  . TETANUS/TDAP  08/14/2027    Physical Exam: Vitals:   08/06/18 1529  BP: 128/60  Pulse: 60  Temp: 98.7 F (37.1 C)  TempSrc: Oral  SpO2: 96%  Weight: 162 lb 3.2 oz (73.6 kg)  Height: 5\' 9"  (1.753 m)   Body mass index is 23.95 kg/m. Physical Exam Vitals signs and nursing note reviewed.  Constitutional:      Appearance: Normal appearance.  HENT:     Head: Normocephalic and atraumatic.     Nose: Septal deviation, congestion and rhinorrhea present.  Left Turbinates: Enlarged.     Right Sinus: No maxillary sinus tenderness or frontal sinus tenderness.     Left Sinus: No maxillary sinus tenderness or frontal sinus tenderness.     Comments: Deviated left septum.    Mouth/Throat:     Mouth: Mucous membranes are moist.     Comments: 2-3 cyst like growth under the tongue for many years.  Eyes:     Extraocular Movements: Extraocular movements intact.     Conjunctiva/sclera: Conjunctivae normal.     Pupils: Pupils are equal, round, and reactive to light.  Neck:     Musculoskeletal: Normal range of motion and neck supple.  Cardiovascular:     Rate and Rhythm: Normal rate and regular rhythm.     Heart sounds: No murmur.     Comments: Pacemaker.  Pulmonary:     Effort: Pulmonary effort is normal.     Breath sounds: No wheezing, rhonchi or rales.  Chest:     Chest wall: No tenderness.  Abdominal:     General: Bowel sounds are normal. There is no distension.      Palpations: Abdomen is soft.     Tenderness: There is no abdominal tenderness. There is no right CVA tenderness, left CVA tenderness, guarding or rebound.  Musculoskeletal:     Right lower leg: Edema present.     Left lower leg: Edema present.     Comments: Trace edema BLE.   Neurological:     General: No focal deficit present.     Mental Status: He is alert and oriented to person, place, and time. Mental status is at baseline.     Cranial Nerves: No cranial nerve deficit.     Motor: No weakness.     Coordination: Coordination normal.     Gait: Gait abnormal.  Psychiatric:        Mood and Affect: Mood normal.        Behavior: Behavior normal.        Thought Content: Thought content normal.        Judgment: Judgment normal.     Labs reviewed: Basic Metabolic Panel: Recent Labs    12/03/17 0000 04/01/18 0000  NA 140  --   K 4.2  --   CL 105  --   CO2 26  --   GLUCOSE 108*  --   BUN 29*  --   CREATININE 1.51*  --   CALCIUM 9.0  --   TSH 3.75 1.87   Liver Function Tests: Recent Labs    12/03/17 0000  AST 17  ALT 18  BILITOT 0.5  PROT 6.7   No results for input(s): LIPASE, AMYLASE in the last 8760 hours. No results for input(s): AMMONIA in the last 8760 hours. CBC: Recent Labs    12/03/17 0000 04/01/18 0000  WBC 7.1 6.8  NEUTROABS  --  3,951  HGB 13.0* 12.9*  HCT 39.7 39.0  MCV 96.4 96.5  PLT 201 182   Lipid Panel: Recent Labs    12/03/17 0000 04/01/18 0000  CHOL 153 164  HDL 69 66  LDLCALC 70 84  TRIG 56 62  CHOLHDL 2.2 2.5   Lab Results  Component Value Date   HGBA1C 6.0 (H) 04/01/2018    Procedures since last visit: No results found.  Assessment/Plan Sinus congestion Sinusitis likely since the yellow drainage reported. CT maxillofacial-delay for now. Will try Astelin bid. Doxycycline 100mg  bid x 14 days. FloraStor bid x 2 weeks.   Acid reflux  Stable, continue Protonix 20mg  qd.   Urinary frequency No urinary retention, continue  Tamsulosin 0.4mg  qd.     Labs/tests ordered:  None  Next appt: 4 months

## 2018-08-06 NOTE — Assessment & Plan Note (Addendum)
Sinusitis likely since the yellow drainage reported. CT maxillofacial-delay for now. Will try Astelin bid. Doxycycline 100mg  bid x 14 days. FloraStor bid x 2 weeks.

## 2018-08-06 NOTE — Assessment & Plan Note (Signed)
No urinary retention, continue Tamsulosin 0.4mg qd.  

## 2018-08-06 NOTE — Assessment & Plan Note (Signed)
Stable, continue Protonix 20mg qd.  

## 2018-08-20 ENCOUNTER — Encounter: Payer: Self-pay | Admitting: Nurse Practitioner

## 2018-08-31 DIAGNOSIS — I495 Sick sinus syndrome: Secondary | ICD-10-CM | POA: Diagnosis not present

## 2018-09-17 ENCOUNTER — Other Ambulatory Visit: Payer: Self-pay

## 2018-09-22 ENCOUNTER — Other Ambulatory Visit: Payer: Self-pay | Admitting: *Deleted

## 2018-09-22 DIAGNOSIS — E785 Hyperlipidemia, unspecified: Secondary | ICD-10-CM

## 2018-09-22 DIAGNOSIS — R849 Unspecified abnormal finding in specimens from respiratory organs and thorax: Secondary | ICD-10-CM

## 2018-09-22 DIAGNOSIS — N183 Chronic kidney disease, stage 3 unspecified: Secondary | ICD-10-CM

## 2018-09-22 DIAGNOSIS — R0982 Postnasal drip: Secondary | ICD-10-CM

## 2018-09-22 DIAGNOSIS — R0981 Nasal congestion: Secondary | ICD-10-CM

## 2018-09-24 ENCOUNTER — Other Ambulatory Visit: Payer: Self-pay

## 2018-09-24 ENCOUNTER — Other Ambulatory Visit: Payer: Medicare Other

## 2018-09-24 DIAGNOSIS — E785 Hyperlipidemia, unspecified: Secondary | ICD-10-CM

## 2018-09-24 DIAGNOSIS — I1310 Hypertensive heart and chronic kidney disease without heart failure, with stage 1 through stage 4 chronic kidney disease, or unspecified chronic kidney disease: Secondary | ICD-10-CM

## 2018-09-24 LAB — COMPLETE METABOLIC PANEL WITH GFR
AG Ratio: 1.5 (calc) (ref 1.0–2.5)
ALT: 14 U/L (ref 9–46)
AST: 17 U/L (ref 10–35)
Albumin: 4 g/dL (ref 3.6–5.1)
Alkaline phosphatase (APISO): 67 U/L (ref 35–144)
BUN/Creatinine Ratio: 21 (calc) (ref 6–22)
BUN: 29 mg/dL — ABNORMAL HIGH (ref 7–25)
CO2: 26 mmol/L (ref 20–32)
Calcium: 8.9 mg/dL (ref 8.6–10.3)
Chloride: 107 mmol/L (ref 98–110)
Creat: 1.41 mg/dL — ABNORMAL HIGH (ref 0.70–1.11)
GFR, Est African American: 50 mL/min/{1.73_m2} — ABNORMAL LOW (ref >=60)
GFR, Est Non African American: 43 mL/min/{1.73_m2} — ABNORMAL LOW (ref >=60)
Globulin: 2.7 g/dL (calc) (ref 1.9–3.7)
Glucose, Bld: 105 mg/dL — ABNORMAL HIGH (ref 65–99)
Potassium: 4 mmol/L (ref 3.5–5.3)
Sodium: 141 mmol/L (ref 135–146)
Total Bilirubin: 0.6 mg/dL (ref 0.2–1.2)
Total Protein: 6.7 g/dL (ref 6.1–8.1)

## 2018-09-24 LAB — LIPID PANEL
Cholesterol: 172 mg/dL (ref <200)
HDL: 68 mg/dL (ref >=40)
LDL Cholesterol (Calc): 91 mg/dL (calc)
Non-HDL Cholesterol (Calc): 104 mg/dL (calc) (ref <130)
Total CHOL/HDL Ratio: 2.5 (calc) (ref <5.0)
Triglycerides: 45 mg/dL (ref <150)

## 2018-09-24 LAB — CBC
HCT: 37.5 % — ABNORMAL LOW (ref 38.5–50.0)
Hemoglobin: 12.3 g/dL — ABNORMAL LOW (ref 13.2–17.1)
MCH: 31.5 pg (ref 27.0–33.0)
MCHC: 32.8 g/dL (ref 32.0–36.0)
MCV: 95.9 fL (ref 80.0–100.0)
MPV: 11.7 fL (ref 7.5–12.5)
Platelets: 200 10*3/uL (ref 140–400)
RBC: 3.91 10*6/uL — ABNORMAL LOW (ref 4.20–5.80)
RDW: 13.1 % (ref 11.0–15.0)
WBC: 6.6 10*3/uL (ref 3.8–10.8)

## 2018-09-29 ENCOUNTER — Other Ambulatory Visit: Payer: Self-pay | Admitting: Otolaryngology

## 2018-10-06 DIAGNOSIS — L570 Actinic keratosis: Secondary | ICD-10-CM | POA: Diagnosis not present

## 2018-10-06 DIAGNOSIS — L821 Other seborrheic keratosis: Secondary | ICD-10-CM | POA: Diagnosis not present

## 2018-10-06 DIAGNOSIS — L814 Other melanin hyperpigmentation: Secondary | ICD-10-CM | POA: Diagnosis not present

## 2018-10-06 DIAGNOSIS — D1801 Hemangioma of skin and subcutaneous tissue: Secondary | ICD-10-CM | POA: Diagnosis not present

## 2018-10-13 ENCOUNTER — Other Ambulatory Visit: Payer: Self-pay

## 2018-10-13 ENCOUNTER — Other Ambulatory Visit (HOSPITAL_COMMUNITY)
Admission: RE | Admit: 2018-10-13 | Discharge: 2018-10-13 | Disposition: A | Discharge status: Home / Self Care | Payer: Medicare Other | Source: Ambulatory Visit | Attending: Otolaryngology | Admitting: Otolaryngology

## 2018-10-13 ENCOUNTER — Encounter (HOSPITAL_COMMUNITY): Payer: Self-pay | Admitting: *Deleted

## 2018-10-13 DIAGNOSIS — Z01812 Encounter for preprocedural laboratory examination: Secondary | ICD-10-CM | POA: Diagnosis not present

## 2018-10-13 DIAGNOSIS — Z20828 Contact with and (suspected) exposure to other viral communicable diseases: Secondary | ICD-10-CM | POA: Insufficient documentation

## 2018-10-13 LAB — SARS CORONAVIRUS 2 (TAT 6-24 HRS): SARS Coronavirus 2: NEGATIVE

## 2018-10-13 NOTE — Anesthesia Preprocedure Evaluation (Addendum)
Anesthesia Evaluation  Patient identified by MRN, date of birth, ID band Patient awake    Reviewed: Allergy & Precautions, NPO status , Patient's Chart, lab work & pertinent test results  History of Anesthesia Complications (+) DIFFICULT AIRWAY  Airway Mallampati: II  TM Distance: >3 FB Neck ROM: Limited    Dental  (+) Dental Advisory Given, Chipped   Pulmonary former smoker (quit 1977),    breath sounds clear to auscultation       Cardiovascular hypertension, Pt. on medications (-) angina+ dysrhythmias Atrial Fibrillation + pacemaker (for sick sinus syndrome)  Rhythm:Regular Rate:Normal     Neuro/Psych negative neurological ROS     GI/Hepatic Neg liver ROS, GERD  Controlled and Medicated,  Endo/Other  negative endocrine ROS  Renal/GU Renal InsufficiencyRenal disease (creat 1.92)     Musculoskeletal  (+) Arthritis ,   Abdominal   Peds  Hematology negative hematology ROS (+)   Anesthesia Other Findings   Reproductive/Obstetrics                            Anesthesia Physical Anesthesia Plan  ASA: III  Anesthesia Plan: General   Post-op Pain Management:    Induction: Intravenous  PONV Risk Score and Plan: 2 and Ondansetron and Dexamethasone  Airway Management Planned: Oral ETT and Video Laryngoscope Planned  Additional Equipment:   Intra-op Plan:   Post-operative Plan: Extubation in OR  Informed Consent: I have reviewed the patients History and Physical, chart, labs and discussed the procedure including the risks, benefits and alternatives for the proposed anesthesia with the patient or authorized representative who has indicated his/her understanding and acceptance.     Dental advisory given  Plan Discussed with: CRNA and Surgeon  Anesthesia Plan Comments:        Anesthesia Quick Evaluation

## 2018-10-13 NOTE — Progress Notes (Signed)
I spoke with Mr and Mrs Aultman. Patienr denies chest pain or shortness of breath.  Patient denies that neither he nor his wife have any s/s of Covid.  Mr Minahan lives in [place] at [facility], patient will go to have Covid test after lunch today. Mr Stefan has a Pacemaker , I have faxed Peri OP Device orders to Dr Hoover Browns Druker's office and I have sent an email to [employer] representatives with patient's surgery information and  That we will notify them when we receive order if their services are needed or not. PCP is Dr Lyndel Safe.

## 2018-10-14 ENCOUNTER — Ambulatory Visit (HOSPITAL_COMMUNITY)
Admission: RE | Admit: 2018-10-14 | Discharge: 2018-10-14 | Disposition: A | Discharge status: Home / Self Care | Payer: Medicare Other | Attending: Otolaryngology | Admitting: Otolaryngology

## 2018-10-14 ENCOUNTER — Ambulatory Visit (HOSPITAL_COMMUNITY): Payer: Medicare Other | Admitting: Anesthesiology

## 2018-10-14 ENCOUNTER — Encounter (HOSPITAL_COMMUNITY)
Admission: RE | Disposition: A | Discharge status: Home / Self Care | Payer: Self-pay | Source: Home / Self Care | Attending: Otolaryngology

## 2018-10-14 ENCOUNTER — Encounter (HOSPITAL_COMMUNITY): Payer: Self-pay

## 2018-10-14 ENCOUNTER — Other Ambulatory Visit: Payer: Self-pay

## 2018-10-14 DIAGNOSIS — I10 Essential (primary) hypertension: Secondary | ICD-10-CM | POA: Insufficient documentation

## 2018-10-14 DIAGNOSIS — Z882 Allergy status to sulfonamides status: Secondary | ICD-10-CM | POA: Diagnosis not present

## 2018-10-14 DIAGNOSIS — I495 Sick sinus syndrome: Secondary | ICD-10-CM | POA: Diagnosis not present

## 2018-10-14 DIAGNOSIS — Z87891 Personal history of nicotine dependence: Secondary | ICD-10-CM | POA: Diagnosis not present

## 2018-10-14 DIAGNOSIS — N289 Disorder of kidney and ureter, unspecified: Secondary | ICD-10-CM | POA: Diagnosis not present

## 2018-10-14 DIAGNOSIS — M1990 Unspecified osteoarthritis, unspecified site: Secondary | ICD-10-CM | POA: Diagnosis not present

## 2018-10-14 DIAGNOSIS — Z7982 Long term (current) use of aspirin: Secondary | ICD-10-CM | POA: Insufficient documentation

## 2018-10-14 DIAGNOSIS — J3489 Other specified disorders of nose and nasal sinuses: Secondary | ICD-10-CM | POA: Insufficient documentation

## 2018-10-14 DIAGNOSIS — J342 Deviated nasal septum: Secondary | ICD-10-CM | POA: Diagnosis not present

## 2018-10-14 DIAGNOSIS — J343 Hypertrophy of nasal turbinates: Secondary | ICD-10-CM | POA: Insufficient documentation

## 2018-10-14 DIAGNOSIS — Z950 Presence of cardiac pacemaker: Secondary | ICD-10-CM | POA: Insufficient documentation

## 2018-10-14 DIAGNOSIS — K219 Gastro-esophageal reflux disease without esophagitis: Secondary | ICD-10-CM | POA: Insufficient documentation

## 2018-10-14 HISTORY — PX: NASAL SEPTOPLASTY W/ TURBINOPLASTY: SHX2070

## 2018-10-14 LAB — BASIC METABOLIC PANEL
Anion gap: 11 (ref 5–15)
BUN: 32 mg/dL — ABNORMAL HIGH (ref 8–23)
CO2: 22 mmol/L (ref 22–32)
Calcium: 8.6 mg/dL — ABNORMAL LOW (ref 8.9–10.3)
Chloride: 107 mmol/L (ref 98–111)
Creatinine, Ser: 1.92 mg/dL — ABNORMAL HIGH (ref 0.61–1.24)
GFR calc Af Amer: 34 mL/min — ABNORMAL LOW (ref >60)
GFR calc non Af Amer: 30 mL/min — ABNORMAL LOW (ref >60)
Glucose, Bld: 95 mg/dL (ref 70–99)
Potassium: 4.1 mmol/L (ref 3.5–5.1)
Sodium: 140 mmol/L (ref 135–145)

## 2018-10-14 LAB — CBC
HCT: 37 % — ABNORMAL LOW (ref 39.0–52.0)
Hemoglobin: 11.9 g/dL — ABNORMAL LOW (ref 13.0–17.0)
MCH: 31 pg (ref 26.0–34.0)
MCHC: 32.2 g/dL (ref 30.0–36.0)
MCV: 96.4 fL (ref 80.0–100.0)
Platelets: 172 10*3/uL (ref 150–400)
RBC: 3.84 MIL/uL — ABNORMAL LOW (ref 4.22–5.81)
RDW: 14.4 % (ref 11.5–15.5)
WBC: 6.6 10*3/uL (ref 4.0–10.5)
nRBC: 0 % (ref 0.0–0.2)

## 2018-10-14 SURGERY — SEPTOPLASTY, NOSE, WITH NASAL TURBINATE REDUCTION
Anesthesia: General | Site: Nose | Laterality: Bilateral

## 2018-10-14 MED ORDER — OXYMETAZOLINE HCL 0.05 % NA SOLN
NASAL | Status: DC | PRN
  Administered 2018-10-14: 1

## 2018-10-14 MED ORDER — PROPOFOL 10 MG/ML IV BOLUS
INTRAVENOUS | Status: DC | PRN
  Administered 2018-10-14: 80 mg via INTRAVENOUS

## 2018-10-14 MED ORDER — PROPOFOL 10 MG/ML IV BOLUS
INTRAVENOUS | Status: AC
  Filled 2018-10-14: qty 20

## 2018-10-14 MED ORDER — ONDANSETRON HCL 4 MG/2ML IJ SOLN
INTRAMUSCULAR | Status: DC | PRN
  Administered 2018-10-14: 4 mg via INTRAVENOUS

## 2018-10-14 MED ORDER — OXYMETAZOLINE HCL 0.05 % NA SOLN
NASAL | Status: AC
  Filled 2018-10-14: qty 30

## 2018-10-14 MED ORDER — LIDOCAINE-EPINEPHRINE 1 %-1:100000 IJ SOLN
INTRAMUSCULAR | Status: DC | PRN
  Administered 2018-10-14: 2 mL

## 2018-10-14 MED ORDER — LIDOCAINE-EPINEPHRINE 1 %-1:100000 IJ SOLN
INTRAMUSCULAR | Status: AC
  Filled 2018-10-14: qty 1

## 2018-10-14 MED ORDER — SODIUM CHLORIDE 0.9 % IV SOLN
INTRAVENOUS | Status: DC | PRN
  Administered 2018-10-14 (×2): via INTRAVENOUS

## 2018-10-14 MED ORDER — AMOXICILLIN 875 MG PO TABS: 875.0000 mg | ORAL_TABLET | Freq: Two times a day (BID) | ORAL | 0 refills | Status: AC

## 2018-10-14 MED ORDER — BACITRACIN ZINC 500 UNIT/GM EX OINT
TOPICAL_OINTMENT | CUTANEOUS | Status: DC | PRN
  Administered 2018-10-14: 1 via TOPICAL

## 2018-10-14 MED ORDER — FENTANYL CITRATE (PF) 250 MCG/5ML IJ SOLN
INTRAMUSCULAR | Status: DC | PRN
  Administered 2018-10-14: 50 ug via INTRAVENOUS

## 2018-10-14 MED ORDER — BACITRACIN ZINC 500 UNIT/GM EX OINT
TOPICAL_OINTMENT | CUTANEOUS | Status: AC
  Filled 2018-10-14: qty 28.35

## 2018-10-14 MED ORDER — 0.9 % SODIUM CHLORIDE (POUR BTL) OPTIME
TOPICAL | Status: DC | PRN
  Administered 2018-10-14: 09:00:00 1000 mL

## 2018-10-14 MED ORDER — SUCCINYLCHOLINE CHLORIDE 200 MG/10ML IV SOSY
PREFILLED_SYRINGE | INTRAVENOUS | Status: DC | PRN
  Administered 2018-10-14: 100 mg via INTRAVENOUS

## 2018-10-14 MED ORDER — FENTANYL CITRATE (PF) 100 MCG/2ML IJ SOLN: 25.0000 ug | INTRAMUSCULAR | Status: DC | PRN

## 2018-10-14 MED ORDER — CEFAZOLIN SODIUM-DEXTROSE 2-3 GM-%(50ML) IV SOLR
INTRAVENOUS | Status: DC | PRN
  Administered 2018-10-14: 2 g via INTRAVENOUS

## 2018-10-14 MED ORDER — OXYCODONE-ACETAMINOPHEN 5-325 MG PO TABS: 1.0000 | ORAL_TABLET | Freq: Four times a day (QID) | ORAL | 0 refills | Status: AC | PRN

## 2018-10-14 MED ORDER — FENTANYL CITRATE (PF) 250 MCG/5ML IJ SOLN
INTRAMUSCULAR | Status: AC
  Filled 2018-10-14: qty 5

## 2018-10-14 MED ORDER — DEXAMETHASONE SODIUM PHOSPHATE 10 MG/ML IJ SOLN
INTRAMUSCULAR | Status: DC | PRN
  Administered 2018-10-14: 4 mg via INTRAVENOUS

## 2018-10-14 MED ORDER — PHENYLEPHRINE HCL (PRESSORS) 10 MG/ML IV SOLN
INTRAVENOUS | Status: DC | PRN
  Administered 2018-10-14: 80 ug via INTRAVENOUS
  Administered 2018-10-14: 120 ug via INTRAVENOUS

## 2018-10-14 SURGICAL SUPPLY — 38 items
BLADE SURG 15 STRL LF DISP TIS (BLADE) ×1 IMPLANT
BLADE SURG 15 STRL SS (BLADE) ×2
CANISTER SUCT 3000ML PPV (MISCELLANEOUS) ×3 IMPLANT
COAGULATOR SUCT 6 FR SWTCH (ELECTROSURGICAL) ×1
COAGULATOR SUCT SWTCH 10FR 6 (ELECTROSURGICAL) ×2 IMPLANT
CONT SPEC 4OZ CLIKSEAL STRL BL (MISCELLANEOUS) ×3 IMPLANT
COVER SURGICAL LIGHT HANDLE (MISCELLANEOUS) ×3 IMPLANT
COVER WAND RF STERILE (DRAPES) ×3 IMPLANT
DRAPE HALF SHEET 40X57 (DRAPES) IMPLANT
ELECT REM PT RETURN 9FT ADLT (ELECTROSURGICAL) ×3
ELECTRODE REM PT RTRN 9FT ADLT (ELECTROSURGICAL) ×1 IMPLANT
GAUZE 4X4 16PLY RFD (DISPOSABLE) ×3 IMPLANT
GAUZE SPONGE 2X2 8PLY STRL LF (GAUZE/BANDAGES/DRESSINGS) IMPLANT
GLOVE ECLIPSE 7.5 STRL STRAW (GLOVE) ×3 IMPLANT
GOWN STRL REUS W/ TWL LRG LVL3 (GOWN DISPOSABLE) ×2 IMPLANT
GOWN STRL REUS W/TWL LRG LVL3 (GOWN DISPOSABLE) ×4
KIT BASIN OR (CUSTOM PROCEDURE TRAY) ×3 IMPLANT
KIT TURNOVER KIT B (KITS) ×3 IMPLANT
MARKER SKIN DUAL TIP RULER LAB (MISCELLANEOUS) ×3 IMPLANT
NEEDLE HYPO 25GX1X1/2 BEV (NEEDLE) ×3 IMPLANT
NS IRRIG 1000ML POUR BTL (IV SOLUTION) ×3 IMPLANT
PACK EENT II TURBAN DRAPE (CUSTOM PROCEDURE TRAY) ×3 IMPLANT
PAD ARMBOARD 7.5X6 YLW CONV (MISCELLANEOUS) ×6 IMPLANT
SOLUTION BUTLER CLEAR DIP (MISCELLANEOUS) ×3 IMPLANT
SPLINT NASAL DOYLE BI-VL (GAUZE/BANDAGES/DRESSINGS) ×3 IMPLANT
SPONGE GAUZE 2X2 STER 10/PKG (GAUZE/BANDAGES/DRESSINGS)
SPONGE NEURO XRAY DETECT 1X3 (DISPOSABLE) ×3 IMPLANT
SUT CHROMIC 3 0 SH 27 (SUTURE) ×3 IMPLANT
SUT CHROMIC 4 0 SH 27 (SUTURE) ×3 IMPLANT
SUT PLAIN 4 0 ~~LOC~~ 1 (SUTURE) ×3 IMPLANT
SUT PROLENE 2 0 FS (SUTURE) ×3 IMPLANT
SUT PROLENE 3 0 PS 2 (SUTURE) ×3 IMPLANT
SYR CONTROL 10ML LL (SYRINGE) ×3 IMPLANT
TRAY ENT MC OR (CUSTOM PROCEDURE TRAY) ×3 IMPLANT
TUBE CONNECTING 12'X1/4 (SUCTIONS) ×1
TUBE CONNECTING 12X1/4 (SUCTIONS) ×2 IMPLANT
TUBE SALEM SUMP 16 FR W/ARV (TUBING) IMPLANT
TUBING EXTENTION W/L.L. (IV SETS) IMPLANT

## 2018-10-14 NOTE — Discharge Instructions (Signed)
POSTOPERATIVE INSTRUCTIONS FOR PATIENTS HAVING NASAL OR SINUS OPERATIONS ACTIVITY: Restrict activity at home for the first two days, resting as much as possible. Light activity is best. You may usually return to work within a week. You should refrain from nose blowing, strenuous activity, or heavy lifting greater than 20lbs for a total of one week after your operation.  If sneezing cannot be avoided, sneeze with your mouth open. DISCOMFORT: You may experience a dull headache and pressure along with nasal congestion and discharge. These symptoms may be worse during the first week after the operation but may last as long as two to four weeks.  Please take Tylenol or the pain medication that has been prescribed for you. Do not take aspirin or aspirin containing medications since they may cause bleeding.  You may experience symptoms of post nasal drainage, nasal congestion, headaches and fatigue for two or three months after your operation.  BLEEDING: You may have some blood tinged nasal drainage for approximately two weeks after the operation.  The discharge will be worse for the first week.  Please call our office at [phone] or go to the nearest hospital emergency room if you experience any of the following: heavy, bright red blood from your nose or mouth that lasts longer than 15 minutes or coughing up or vomiting bright red blood or blood clots. GENERAL CONSIDERATIONS: 1. A gauze dressing will be placed on your upper lip to absorb any drainage after the operation. You may need to change this several times a day.  If you do not have very much drainage, you may remove the dressing.  Remember that you may gently wipe your nose with a tissue and sniff in, but DO NOT blow your nose. 2. Please keep all of your postoperative appointments.  Your final results after the operation will depend on proper follow-up.  The initial visit is usually 2 to 5 days after the operation.  During this visit, the remaining nasal  packing and internal septal splints will be removed.  Your nasal and sinus cavities will be cleaned.  During the second visit, your nasal and sinus cavities will be cleaned again. Have someone drive you to your first two postoperative appointments.  3. How you care for your nose after the operation will influence the results that you obtain.  You should follow all directions, take your medication as prescribed, and call our office [phone] with any problems or questions. 4. You may be more comfortable sleeping with your head elevated on two pillows. 5. Do not take any medications that we have not prescribed or recommended. WARNING SIGNS: if any of the following should occur, please call our office: 1. Persistent fever greater than 102F. 2. Persistent vomiting. 3. Severe and constant pain that is not relieved by prescribed pain medication. 4. Trauma to the nose. 5. Rash or unusual side effects from any medicines.  

## 2018-10-14 NOTE — Anesthesia Procedure Notes (Signed)
Procedure Name: Intubation Date/Time: 10/14/2018 8:37 AM Performed by: Babs Bertin, CRNA Pre-anesthesia Checklist: Patient identified, Emergency Drugs available, Suction available and Patient being monitored Patient Re-evaluated:Patient Re-evaluated prior to induction Oxygen Delivery Method: Circle System Utilized Preoxygenation: Pre-oxygenation with 100% oxygen Induction Type: IV induction and Rapid sequence Laryngoscope Size: Glidescope (elective glidescope intubation d/t h/o difficult airway) Grade View: Grade I Tube type: Oral Rae Tube size: 7.5 mm Number of attempts: 1 Airway Equipment and Method: Stylet and Oral airway Placement Confirmation: ETT inserted through vocal cords under direct vision,  positive ETCO2 and breath sounds checked- equal and bilateral Secured at: 22 cm Tube secured with: Tape Dental Injury: Teeth and Oropharynx as per pre-operative assessment

## 2018-10-14 NOTE — Anesthesia Postprocedure Evaluation (Signed)
Anesthesia Post Note  Patient: Stanley Hunter  Procedure(s) Performed: NASAL SEPTOPLASTY WITH BILATERAL  TURBINATE REDUCTION (Bilateral Nose)     Patient location during evaluation: PACU Anesthesia Type: General Level of consciousness: awake and alert, patient cooperative and oriented Pain management: pain level controlled Vital Signs Assessment: post-procedure vital signs reviewed and stable Respiratory status: spontaneous breathing, nonlabored ventilation and respiratory function stable Cardiovascular status: blood pressure returned to baseline and stable Postop Assessment: no apparent nausea or vomiting Anesthetic complications: no    Last Vitals:  Vitals:   10/14/18 1012 10/14/18 1014  BP: 136/62   Pulse: (!) 59 (!) 59  Resp: 16 12  Temp:    SpO2: 97% 97%    Last Pain:  Vitals:   10/14/18 1015  PainSc: 0-No pain                 Leobardo Granlund,E. Renelda Kilian

## 2018-10-14 NOTE — H&P (Signed)
Cc: Chronic nasal obstruction  HPI: The patient is a 85 year old male who presents today complaining of chronic nasal congestion for more than 1 year.  His nasal obstruction is worse at night.  As a result, he has become a habitual mouth breather.  He has frequent sore throat in the morning.  He was previously diagnosed with severe nasal septal deviation.  He has no previous sinonasal surgery.   He denies any recent sinusitis.  Currently he denies any facial pain, fever or visual change.  He has no significant environmental allergies on his recent allergy evaluation.  Due to his chronic nasal obstruction, he has been using Afrin nasal spray regularly.  The patient has a history of irregular heartbeats. He has a pacemaker.  He is currently on aspirin.    The patient's review of systems (constitutional, eyes, ENT, cardiovascular, respiratory, GI, musculoskeletal, skin, neurologic, psychiatric, endocrine, hematologic, allergic) is noted in the ROS questionnaire.  It is reviewed with the patient.   Family health history: No HTN, DM, CAD, hearing loss or bleeding disorder.  Major events: Pacemaker.  Ongoing medical problems: Hypertension,irregular pulse, pacemaker.  Social history: The patient is married. He denies the use of tobacco, alcohol or illegal drugs.   Exam: General: Communicates without difficulty, well nourished, no acute distress. Head: Normocephalic, no evidence injury, no tenderness, facial buttresses intact without stepoff. Face/sinus: No tenderness to palpation and percussion. Facial movement is normal and symmetric. Eyes: PERRL, EOMI. No scleral icterus, conjunctivae clear. Neuro: CN II exam reveals vision grossly intact.  No nystagmus at any point of gaze. Ears: Auricles well formed without lesions.  EAC: Bilateral cerumen impaction.  Under the operating microscope, the cerumen is carefully removed with a combination of cerumen currette, alligator forceps, and suction catheters.  After the  cerumen is removed, the TMs are noted to be normal.  No mass, erythema, or lesions. Nose: External evaluation reveals normal support and skin without lesions.  Dorsum is intact.  Anterior rhinoscopy reveals congested mucosa over anterior aspect of inferior turbinates and deviated septum.  No purulence noted. Oral:  Oral cavity and oropharynx are intact, symmetric, without erythema or edema.  Mucosa is moist without lesions. Neck: Full range of motion without pain.  There is no significant lymphadenopathy.  No masses palpable.  Thyroid bed within normal limits to palpation.  Parotid glands and submandibular glands equal bilaterally without mass.  Trachea is midline. Neuro:  CN 2-12 grossly intact. Gait normal.   Procedure:  Flexible Nasal Endoscopy: Risks, benefits, and alternatives of flexible endoscopy were explained to the patient.  Specific mention was made of the risk of throat numbness with difficulty swallowing, possible bleeding from the nose and mouth, and pain from the procedure.  The patient gave oral consent to proceed.  The nasal cavities were decongested and anesthetised with a combination of oxymetazoline and 4% lidocaine solution.  The flexible scope was inserted into the right nasal cavity.  Severe NSD noted. Endoscopy of the inferior and middle meatus was performed.  The edematous mucosa was as described above.  No polyp, mass, or lesion was appreciated.  Olfactory cleft was clear.  Nasopharynx was clear.  Turbinates were hypertrophied but without mass.  Incomplete response to decongestion.  The procedure was repeated on the contralateral side with similar findings.  The patient tolerated the procedure well.  Instructions were given to avoid eating or drinking for 2 hours.   Assessment 1.  Chronic nasal obstruction secondary to severe nasal septal deviation and bilateral  inferior turbinate hypertrophy.  His right nasal passageway is 100% obstructed.  His left nasal passageway is more than 95%  obstructed.  2.  Incidental finding of bilateral cerumen impaction.  3.  No evidence of acute infection, polyps, mass or lesion is noted on today's nasal endoscopy examination.   Plan  1.  The physical exam and nasal endoscopy findings are reviewed with the patient.  2.  Otomicroscopy with bilateral cerumen disimpaction.  3.  The patient is encouraged to decrease the use of Afrin nasal spray.  He is started on Flonase nasal spray 2 sprays each nostril daily.  4.  Nasal saline irrigation. The instructions on how to perform the irrigation are reviewed.  5.  Based on the severity of his nasal obstruction, he is a good candidate to undergo septoplasty and turbinate reduction.  The risks, benefits and details of the procedures are reviewed with the patient. Questions are invited and answered.  6.  The patient would like to proceed with the procedures.

## 2018-10-14 NOTE — Op Note (Signed)
DATE OF PROCEDURE: 10/14/2018  OPERATIVE REPORT   SURGEON: Leta Baptist, MD   PREOPERATIVE DIAGNOSES:  1. Severe nasal septal deviation.  2. Bilateral inferior turbinate hypertrophy.  3. Chronic nasal obstruction.  POSTOPERATIVE DIAGNOSES:  1. Severe nasal septal deviation.  2. Bilateral inferior turbinate hypertrophy.  3. Chronic nasal obstruction.  PROCEDURE PERFORMED:  1. Septoplasty.  2. Bilateral partial inferior turbinate resection.   ANESTHESIA: General endotracheal tube anesthesia.   COMPLICATIONS: None.   ESTIMATED BLOOD LOSS: 100 mL.   INDICATION FOR PROCEDURE: Stanley Hunter is a 85 y.o. male with a history of chronic nasal obstruction. The patient was  treated with antihistamine, decongestant, and steroid nasal spray. However, the patient continued to be symptomatic. On examination, the patient was noted to have bilateral severe inferior turbinate hypertrophy and significant nasal septal deviation, causing significant nasal obstruction. Based on the above findings, the decision was made for the patient to undergo the above-stated procedures. The risks, benefits, alternatives, and details of the procedures were discussed with the patient. Questions were invited and answered. Informed consent was obtained.   DESCRIPTION OF PROCEDURE: The patient was taken to the operating room and placed supine on the operating table. General endotracheal tube anesthesia was administered by the anesthesiologist. The patient was positioned, and prepped and draped in the standard fashion for nasal surgery. Pledgets soaked with Afrin were placed in both nasal cavities for decongestion. The pledgets were subsequently removed.  Examination of the nasal cavity revealed a severe nasal septal deviationd. 1% lidocaine with 1:100,000 epinephrine was injected onto the nasal septum bilaterally. A hemitransfixion incision was made on the left side. The mucosal flap was carefully elevated on the left side. A  cartilaginous incision was made 1 cm superior to the caudal margin of the nasal septum. Mucosal flap was also elevated on the right side in the similar fashion. It should be noted that due to the severe septal deviation, the deviated portion of the cartilaginous and bony septum had to be removed in piecemeal fashion. Once the deviated portions were removed, a straight midline septum was achieved. The septum was then quilted with 4-0 plain gut sutures. The hemitransfixion incision was closed with interrupted 4-0 chromic sutures.   The inferior one half of both hypertrophied inferior turbinate was crossclamped with a Kelly clamp. The inferior one half of each inferior turbinate was then resected with a pair of cross cutting scissors. Hemostasis was achieved with a suction cautery device. Doyle splints were applied to the nasal septum.  The care of the patient was turned over to the anesthesiologist. The patient was awakened from anesthesia without difficulty. The patient was extubated and transferred to the recovery room in good condition.   OPERATIVE FINDINGS: Severe nasal septal deviation and bilateral inferior turbinate hypertrophy.   SPECIMEN: None.   FOLLOWUP CARE: The patient be discharged home once he is awake and alert. The patient will be placed on Percocet 1 tablets p.o. q.6 hours p.r.n. pain, and amoxicillin 875 mg p.o. b.i.d. for 3 days. The patient will follow up in my office in 2 days for splint removal.   Kelita Wallis Raynelle Bring, MD

## 2018-10-14 NOTE — Transfer of Care (Signed)
Immediate Anesthesia Transfer of Care Note  Patient: Stanley Hunter  Procedure(s) Performed: NASAL SEPTOPLASTY WITH BILATERAL  TURBINATE REDUCTION (Bilateral Nose)  Patient Location: PACU  Anesthesia Type:General  Level of Consciousness: awake, alert  and oriented  Airway & Oxygen Therapy: Patient Spontanous Breathing and Patient connected to nasal cannula oxygen  Post-op Assessment: Report given to RN and Post -op Vital signs reviewed and stable  Post vital signs: Reviewed and stable  Last Vitals:  Vitals Value Taken Time  BP    Temp    Pulse 60 10/14/18 0943  Resp    SpO2 100 % 10/14/18 0943  Vitals shown include unvalidated device data.  Last Pain:  Vitals:   10/14/18 0739  PainSc: 0-No pain         Complications: No apparent anesthesia complications

## 2018-10-15 ENCOUNTER — Encounter (HOSPITAL_COMMUNITY): Payer: Self-pay | Admitting: Otolaryngology

## 2018-11-30 ENCOUNTER — Other Ambulatory Visit: Payer: Self-pay | Admitting: Family

## 2018-12-08 DIAGNOSIS — I495 Sick sinus syndrome: Secondary | ICD-10-CM | POA: Diagnosis not present

## 2018-12-08 DIAGNOSIS — I491 Atrial premature depolarization: Secondary | ICD-10-CM | POA: Diagnosis not present

## 2018-12-08 DIAGNOSIS — I10 Essential (primary) hypertension: Secondary | ICD-10-CM | POA: Diagnosis not present

## 2018-12-22 ENCOUNTER — Other Ambulatory Visit: Payer: Self-pay | Admitting: Nurse Practitioner

## 2018-12-30 DIAGNOSIS — H04123 Dry eye syndrome of bilateral lacrimal glands: Secondary | ICD-10-CM | POA: Diagnosis not present

## 2018-12-30 DIAGNOSIS — H53031 Strabismic amblyopia, right eye: Secondary | ICD-10-CM | POA: Diagnosis not present

## 2018-12-30 DIAGNOSIS — H35371 Puckering of macula, right eye: Secondary | ICD-10-CM | POA: Diagnosis not present

## 2018-12-30 DIAGNOSIS — H353132 Nonexudative age-related macular degeneration, bilateral, intermediate dry stage: Secondary | ICD-10-CM | POA: Diagnosis not present

## 2019-01-08 ENCOUNTER — Encounter: Payer: Self-pay | Admitting: Internal Medicine

## 2019-01-08 ENCOUNTER — Non-Acute Institutional Stay: Payer: Medicare Other | Admitting: Internal Medicine

## 2019-01-08 ENCOUNTER — Other Ambulatory Visit: Payer: Self-pay

## 2019-01-08 VITALS — BP 126/60 | HR 60 | Temp 96.6°F | Ht 69.0 in | Wt 167.2 lb

## 2019-01-08 DIAGNOSIS — M48062 Spinal stenosis, lumbar region with neurogenic claudication: Secondary | ICD-10-CM | POA: Diagnosis not present

## 2019-01-08 DIAGNOSIS — G3184 Mild cognitive impairment, so stated: Secondary | ICD-10-CM

## 2019-01-08 DIAGNOSIS — R7303 Prediabetes: Secondary | ICD-10-CM | POA: Diagnosis not present

## 2019-01-08 DIAGNOSIS — E785 Hyperlipidemia, unspecified: Secondary | ICD-10-CM

## 2019-01-08 DIAGNOSIS — N1832 Chronic kidney disease, stage 3b: Secondary | ICD-10-CM | POA: Diagnosis not present

## 2019-01-08 DIAGNOSIS — R600 Localized edema: Secondary | ICD-10-CM | POA: Diagnosis not present

## 2019-01-08 DIAGNOSIS — R350 Frequency of micturition: Secondary | ICD-10-CM | POA: Diagnosis not present

## 2019-01-08 DIAGNOSIS — I495 Sick sinus syndrome: Secondary | ICD-10-CM

## 2019-01-08 DIAGNOSIS — G5602 Carpal tunnel syndrome, left upper limb: Secondary | ICD-10-CM

## 2019-01-08 DIAGNOSIS — R0981 Nasal congestion: Secondary | ICD-10-CM | POA: Diagnosis not present

## 2019-01-08 DIAGNOSIS — F419 Anxiety disorder, unspecified: Secondary | ICD-10-CM

## 2019-01-08 DIAGNOSIS — N401 Enlarged prostate with lower urinary tract symptoms: Secondary | ICD-10-CM | POA: Diagnosis not present

## 2019-01-08 DIAGNOSIS — N138 Other obstructive and reflux uropathy: Secondary | ICD-10-CM

## 2019-01-08 NOTE — Progress Notes (Signed)
Location:  [facility] of Service:  Clinic (12)  Provider:   Code Status:  Goals of Care:  Advanced Directives 08/06/2018  Does Patient Have a Medical Advance Directive? Yes  Type of Advance Directive Living will;Out of facility DNR (pink MOST or yellow form);Healthcare Power of Attorney  Does patient want to make changes to medical advance directive? No - Patient declined  Copy of [place] in Chart? Yes - validated most recent copy scanned in chart (See row information)  Pre-existing out of facility DNR order (yellow form or pink MOST form) Yellow form placed in chart (order not valid for inpatient use);Pink MOST form placed in chart (order not valid for inpatient use)     Chief Complaint  Patient presents with  . Medical Management of Chronic Issues    4 month follow up. Patient recently had his deviated septum fixed. He does complain 'of loss of feeling in fingers and bottom of feet for the last month.  . Health Maintenance    PPSV23    HPI: Patient is a 85 y.o. male seen today for medical management of chronic diseases.   Chronic allergic rhinitis Underwent surgery for his deviated septum.  Since then patient is doing well is not taking Flonase or Afrin. Lower extremity swelling Denies any shortness of breath cough or PND Does not want to take Lasix Hypertension Stable Sick sinus syndrome S/p pacemaker in 2015.  Was recently seen by cardiology and per their notes he is doing good History of BPH On Flomax.  Continues to have issues gets up at least 2 times at night.  Does not want to see urologist at this time Chronic back pain s/p laminectomy in the past Walks with a walker.  Is independent in his ADLs.  Still drives with his wife.  Did not have any issues.  Does take naproxen but states once in few months Complaining of numbness in his left hand Noticed more when he is resting or sleeping.  Has diagnosis of carpal tunnel in the  past Anxiety Takes as needed Xanax.  States he takes it 1 tablet every few months  POA is daughter who lives in [place]. One sone in [place] and one daughter in [place] Past Medical History:  Diagnosis Date  . Acid reflux 08/03/2015  . Anxiety 08/03/2015  . Avitaminosis D 05/18/2012  . Benign essential HTN 02/09/2009   Overview:  Benign Essential Hypertension   . Benign prostatic hyperplasia with urinary obstruction 12/07/2013  . Chronic kidney disease (CKD), stage III (moderate) 11/04/2011  . CN (constipation) 08/03/2015  . Cognitive changes 08/07/2015  . Degenerative arthritis of hip 05/14/2013  . Degenerative arthritis of spine 05/14/2013  . Degenerative disorder of muscle 08/03/2015  . Dysrhythmia 2001   ablation for a-flutter  . H/O atrial flutter 08/03/2015   Overview:  Pacemaker placed 2015   . High blood pressure   . High cholesterol   . History of pacemaker 2016  . Hx of cardiac cath 2013  . Loss of balance 08/07/2015  . Lumbar canal stenosis 07/07/2013   Overview:  Lumbar laminectomy 07/07/13 - Dr. Lurene Shadow   . Lumbar scoliosis 05/14/2013  . Macular degeneration 08/07/2015  . Premature contractions, supraventricular 12/15/2008   Overview:  Atrial Premature Complex   . Presence of permanent cardiac pacemaker   . Tremor 08/07/2015   hands  . Urinary frequency 08/07/2015    Past Surgical History:  Procedure Laterality Date  .  ATRIAL FLUTTER ABLATION  2001  . CATARACT EXTRACTION Bilateral 2015  . CHOLECYSTECTOMY    . COLONOSCOPY    . MASTOIDECTOMY  1934  . NASAL SEPTOPLASTY W/ TURBINOPLASTY Bilateral 10/14/2018   Procedure: NASAL SEPTOPLASTY WITH BILATERAL  TURBINATE REDUCTION;  Surgeon: Leta Baptist, MD;  Location: [facility];  Service: ENT;  Laterality: Bilateral;  . PACEMAKER PLACEMENT  2016  . SPINE SURGERY  2016   LUMBER- lower  . TONSILLECTOMY  1934  . TRIGGER FINGER RELEASE Left 12/14/2015   Procedure: RELEASE TRIGGER FINGER/A-1 PULLEY ring finger left;  Surgeon: Daryll Brod,  MD;  Location: [facility];  Service: Orthopedics;  Laterality: Left;  FAB    Allergies  Allergen Reactions  . Cortisone Other (See Comments)    Unknown reaction  . Neomycin Rash  . Sulfamethoxazole-Trimethoprim Other (See Comments)    Leg weakness, arrhythmia    Outpatient Encounter Medications as of 01/08/2019  Medication Sig  . ALPRAZolam (XANAX) 0.5 MG tablet Take 0.5 mg by mouth at bedtime as needed for sleep.   Marland Kitchen amLODipine (NORVASC) 10 MG tablet TAKE 1 TABLET DAILY (Patient taking differently: Take 10 mg by mouth daily. )  . aspirin EC 81 MG tablet Take 81 mg by mouth. Twice a week  . atorvastatin (LIPITOR) 20 MG tablet TAKE 1 TABLET DAILY  . Cholecalciferol (VITAMIN D) 50 MCG (2000 UT) tablet Take 2,000 Units by mouth daily.  . magnesium hydroxide (MILK OF MAGNESIA) 800 MG/5ML suspension Take 30 mLs by mouth as needed for constipation.  . Multiple Vitamins-Minerals (PRESERVISION AREDS) CAPS Take 1 capsule by mouth 2 (two) times a day.   . pantoprazole (PROTONIX) 20 MG tablet TAKE 1 TABLET DAILY  . propafenone (RYTHMOL) 150 MG tablet Take 225 mg by mouth 4 (four) times daily.   . tamsulosin (FLOMAX) 0.4 MG CAPS capsule TAKE 1 CAPSULE AT BEDTIME (Patient taking differently: Take 0.4 mg by mouth at bedtime. )  . [DISCONTINUED] loratadine (CLARITIN) 10 MG tablet Take 1 tablet (10 mg total) by mouth daily for 15 days. (Patient not taking: Reported on 10/07/2018)   No facility-administered encounter medications on file as of 01/08/2019.     Review of Systems:  Review of Systems All 10 Systems reviewed Negative Pertinent per Presenting Complain   Health Maintenance  Topic Date Due  . PNA vac Low Risk Adult (2 of 2 - PPSV23) 08/14/2018  . TETANUS/TDAP  08/14/2027  . INFLUENZA VACCINE  Completed    Physical Exam: Vitals:   01/08/19 0914  BP: 126/60  Pulse: 60  Temp: (!) 96.6 F (35.9 C)  SpO2: 98%  Weight: 167 lb 3.2 oz (75.8 kg)  Height: 5\' 9"  (1.753 m)    Body mass index is 24.69 kg/m. Physical Exam  Constitutional: Oriented to person, place, and time. Well-developed and well-nourished.  HENT:  Head: Normocephalic.  Mouth/Throat: Oropharynx is clear and moist.  Eyes: Pupils are equal, round, and reactive to light.  Neck: Neck supple.  Cardiovascular: Normal rate and normal heart sounds.  No murmur heard. Pulmonary/Chest: Effort normal and breath sounds normal. No respiratory distress. No wheezes. She has no rales.  Abdominal: Soft. Bowel sounds are normal. No distension. There is no tenderness. There is no rebound.  Musculoskeletal:Has Moderate Edema Bilateral Lymphadenopathy: none Neurological: Alert and oriented to person, place, and time. Gait was stable without the walker. But was slow Skin: Skin is warm and dry.  Psychiatric: Normal mood and affect. Behavior is normal. Thought content normal.  Labs reviewed: Basic Metabolic Panel: Recent Labs    04/01/18 0000 09/24/18 0655 10/14/18 0719  NA  --  141 140  K  --  4.0 4.1  CL  --  107 107  CO2  --  26 22  GLUCOSE  --  105* 95  BUN  --  29* 32*  CREATININE  --  1.41* 1.92*  CALCIUM  --  8.9 8.6*  TSH 1.87  --   --    Liver Function Tests: Recent Labs    09/24/18 0655  AST 17  ALT 14  BILITOT 0.6  PROT 6.7   No results for input(s): LIPASE, AMYLASE in the last 8760 hours. No results for input(s): AMMONIA in the last 8760 hours. CBC: Recent Labs    04/01/18 0000 09/24/18 0655 10/14/18 0719  WBC 6.8 6.6 6.6  NEUTROABS 3,951  --   --   HGB 12.9* 12.3* 11.9*  HCT 39.0 37.5* 37.0*  MCV 96.5 95.9 96.4  PLT 182 200 172   Lipid Panel: Recent Labs    04/01/18 0000 09/24/18 0655  CHOL 164 172  HDL 66 68  LDLCALC 84 91  TRIG 62 45  CHOLHDL 2.5 2.5   Lab Results  Component Value Date   HGBA1C 6.0 (H) 04/01/2018    Procedures since last visit: No results found.  Assessment/Plan  Left carpal tunnel syndrome Suggested to wear brace .   Bilateral leg edema Doe snot want to try Lasix Try raising legs when sittong Sinus congestion Resolved after surgery  Hyperlipidemia, unspecified hyperlipidemia type LDL is less then 100 On Statin  Urinary frequency Continue Flomax Will let me know if need for Urology  Anxiety PRN xanax Very Infrequent use  Spinal stenosis of lumbar region with neurogenic claudication Takes naproxen D/W him the side effects Very infrequent use  Sick sinus syndrome ([place]) Doing well with Pacemaker  Mild cognitive impairment Needs MMSE next visit CKD stage 3 b Repeat BMP  Benign prostatic hyperplasia with urinary obstruction On Flomax If symptoms worsen will need referral  Needs Pneumovax 23 . Wants to wait next visit Will order labs     Labs/tests ordered:  * No order type specified *  Total time spent in this patient care encounter was  40_  minutes; greater than 50% of the visit spent counseling patient and staff, reviewing records , Labs and coordinating care for problems addressed at this encounter.  Next appt:  Visit date not found

## 2019-01-18 ENCOUNTER — Other Ambulatory Visit: Payer: Self-pay | Admitting: Nurse Practitioner

## 2019-01-18 DIAGNOSIS — I129 Hypertensive chronic kidney disease with stage 1 through stage 4 chronic kidney disease, or unspecified chronic kidney disease: Secondary | ICD-10-CM

## 2019-03-01 DIAGNOSIS — Z23 Encounter for immunization: Secondary | ICD-10-CM | POA: Diagnosis not present

## 2019-03-15 ENCOUNTER — Other Ambulatory Visit: Payer: Self-pay

## 2019-03-15 ENCOUNTER — Inpatient Hospital Stay (HOSPITAL_COMMUNITY)
Admission: EM | Admit: 2019-03-15 | Discharge: 2019-03-29 | DRG: 193 | Disposition: E | Payer: Medicare Other | Attending: Internal Medicine | Admitting: Internal Medicine

## 2019-03-15 ENCOUNTER — Emergency Department (HOSPITAL_COMMUNITY): Payer: Medicare Other

## 2019-03-15 ENCOUNTER — Encounter (HOSPITAL_COMMUNITY): Payer: Self-pay | Admitting: Emergency Medicine

## 2019-03-15 DIAGNOSIS — I4892 Unspecified atrial flutter: Secondary | ICD-10-CM | POA: Diagnosis not present

## 2019-03-15 DIAGNOSIS — Z20822 Contact with and (suspected) exposure to covid-19: Secondary | ICD-10-CM | POA: Diagnosis present

## 2019-03-15 DIAGNOSIS — I4891 Unspecified atrial fibrillation: Secondary | ICD-10-CM | POA: Diagnosis not present

## 2019-03-15 DIAGNOSIS — N390 Urinary tract infection, site not specified: Secondary | ICD-10-CM | POA: Diagnosis not present

## 2019-03-15 DIAGNOSIS — Z7982 Long term (current) use of aspirin: Secondary | ICD-10-CM

## 2019-03-15 DIAGNOSIS — J189 Pneumonia, unspecified organism: Secondary | ICD-10-CM | POA: Diagnosis present

## 2019-03-15 DIAGNOSIS — I480 Paroxysmal atrial fibrillation: Secondary | ICD-10-CM | POA: Diagnosis present

## 2019-03-15 DIAGNOSIS — I2510 Atherosclerotic heart disease of native coronary artery without angina pectoris: Secondary | ICD-10-CM | POA: Diagnosis present

## 2019-03-15 DIAGNOSIS — Z882 Allergy status to sulfonamides status: Secondary | ICD-10-CM

## 2019-03-15 DIAGNOSIS — I4519 Other right bundle-branch block: Secondary | ICD-10-CM | POA: Diagnosis present

## 2019-03-15 DIAGNOSIS — J918 Pleural effusion in other conditions classified elsewhere: Secondary | ICD-10-CM | POA: Diagnosis not present

## 2019-03-15 DIAGNOSIS — Z79899 Other long term (current) drug therapy: Secondary | ICD-10-CM

## 2019-03-15 DIAGNOSIS — R531 Weakness: Secondary | ICD-10-CM | POA: Diagnosis not present

## 2019-03-15 DIAGNOSIS — D638 Anemia in other chronic diseases classified elsewhere: Secondary | ICD-10-CM | POA: Diagnosis present

## 2019-03-15 DIAGNOSIS — Z9841 Cataract extraction status, right eye: Secondary | ICD-10-CM

## 2019-03-15 DIAGNOSIS — I4819 Other persistent atrial fibrillation: Secondary | ICD-10-CM

## 2019-03-15 DIAGNOSIS — J9601 Acute respiratory failure with hypoxia: Secondary | ICD-10-CM | POA: Diagnosis present

## 2019-03-15 DIAGNOSIS — Z9089 Acquired absence of other organs: Secondary | ICD-10-CM

## 2019-03-15 DIAGNOSIS — Z66 Do not resuscitate: Secondary | ICD-10-CM | POA: Diagnosis present

## 2019-03-15 DIAGNOSIS — Z888 Allergy status to other drugs, medicaments and biological substances status: Secondary | ICD-10-CM

## 2019-03-15 DIAGNOSIS — J181 Lobar pneumonia, unspecified organism: Principal | ICD-10-CM | POA: Diagnosis present

## 2019-03-15 DIAGNOSIS — N1832 Chronic kidney disease, stage 3b: Secondary | ICD-10-CM | POA: Diagnosis present

## 2019-03-15 DIAGNOSIS — F419 Anxiety disorder, unspecified: Secondary | ICD-10-CM | POA: Diagnosis present

## 2019-03-15 DIAGNOSIS — R000 Tachycardia, unspecified: Secondary | ICD-10-CM | POA: Diagnosis not present

## 2019-03-15 DIAGNOSIS — R0902 Hypoxemia: Secondary | ICD-10-CM | POA: Diagnosis not present

## 2019-03-15 DIAGNOSIS — Z515 Encounter for palliative care: Secondary | ICD-10-CM

## 2019-03-15 DIAGNOSIS — I509 Heart failure, unspecified: Secondary | ICD-10-CM | POA: Diagnosis present

## 2019-03-15 DIAGNOSIS — H3530 Unspecified macular degeneration: Secondary | ICD-10-CM | POA: Diagnosis present

## 2019-03-15 DIAGNOSIS — R0600 Dyspnea, unspecified: Secondary | ICD-10-CM

## 2019-03-15 DIAGNOSIS — J90 Pleural effusion, not elsewhere classified: Secondary | ICD-10-CM

## 2019-03-15 DIAGNOSIS — B957 Other staphylococcus as the cause of diseases classified elsewhere: Secondary | ICD-10-CM | POA: Diagnosis not present

## 2019-03-15 DIAGNOSIS — E7800 Pure hypercholesterolemia, unspecified: Secondary | ICD-10-CM | POA: Diagnosis present

## 2019-03-15 DIAGNOSIS — Z209 Contact with and (suspected) exposure to unspecified communicable disease: Secondary | ICD-10-CM | POA: Diagnosis not present

## 2019-03-15 DIAGNOSIS — E785 Hyperlipidemia, unspecified: Secondary | ICD-10-CM | POA: Diagnosis present

## 2019-03-15 DIAGNOSIS — K1230 Oral mucositis (ulcerative), unspecified: Secondary | ICD-10-CM | POA: Diagnosis present

## 2019-03-15 DIAGNOSIS — Z881 Allergy status to other antibiotic agents status: Secondary | ICD-10-CM

## 2019-03-15 DIAGNOSIS — I130 Hypertensive heart and chronic kidney disease with heart failure and stage 1 through stage 4 chronic kidney disease, or unspecified chronic kidney disease: Secondary | ICD-10-CM | POA: Diagnosis present

## 2019-03-15 DIAGNOSIS — Z9842 Cataract extraction status, left eye: Secondary | ICD-10-CM

## 2019-03-15 DIAGNOSIS — Z950 Presence of cardiac pacemaker: Secondary | ICD-10-CM

## 2019-03-15 DIAGNOSIS — K219 Gastro-esophageal reflux disease without esophagitis: Secondary | ICD-10-CM | POA: Diagnosis present

## 2019-03-15 DIAGNOSIS — I495 Sick sinus syndrome: Secondary | ICD-10-CM | POA: Diagnosis present

## 2019-03-15 DIAGNOSIS — N400 Enlarged prostate without lower urinary tract symptoms: Secondary | ICD-10-CM | POA: Diagnosis present

## 2019-03-15 DIAGNOSIS — Z87891 Personal history of nicotine dependence: Secondary | ICD-10-CM

## 2019-03-15 DIAGNOSIS — N179 Acute kidney failure, unspecified: Secondary | ICD-10-CM | POA: Diagnosis present

## 2019-03-15 DIAGNOSIS — R509 Fever, unspecified: Secondary | ICD-10-CM | POA: Diagnosis not present

## 2019-03-15 DIAGNOSIS — R0602 Shortness of breath: Secondary | ICD-10-CM | POA: Diagnosis not present

## 2019-03-15 DIAGNOSIS — D631 Anemia in chronic kidney disease: Secondary | ICD-10-CM | POA: Diagnosis present

## 2019-03-15 DIAGNOSIS — I1310 Hypertensive heart and chronic kidney disease without heart failure, with stage 1 through stage 4 chronic kidney disease, or unspecified chronic kidney disease: Secondary | ICD-10-CM | POA: Diagnosis present

## 2019-03-15 LAB — CBC WITH DIFFERENTIAL/PLATELET
Abs Immature Granulocytes: 0.04 10*3/uL (ref 0.00–0.07)
Basophils Absolute: 0 10*3/uL (ref 0.0–0.1)
Basophils Relative: 0 %
Eosinophils Absolute: 0 10*3/uL (ref 0.0–0.5)
Eosinophils Relative: 0 %
HCT: 35.1 % — ABNORMAL LOW (ref 39.0–52.0)
Hemoglobin: 11.4 g/dL — ABNORMAL LOW (ref 13.0–17.0)
Immature Granulocytes: 0 %
Lymphocytes Relative: 10 %
Lymphs Abs: 1.2 10*3/uL (ref 0.7–4.0)
MCH: 31.2 pg (ref 26.0–34.0)
MCHC: 32.5 g/dL (ref 30.0–36.0)
MCV: 96.2 fL (ref 80.0–100.0)
Monocytes Absolute: 2.5 10*3/uL — ABNORMAL HIGH (ref 0.1–1.0)
Monocytes Relative: 21 %
Neutro Abs: 8.1 10*3/uL — ABNORMAL HIGH (ref 1.7–7.7)
Neutrophils Relative %: 69 %
Platelets: 181 10*3/uL (ref 150–400)
RBC: 3.65 MIL/uL — ABNORMAL LOW (ref 4.22–5.81)
RDW: 15.2 % (ref 11.5–15.5)
WBC: 11.9 10*3/uL — ABNORMAL HIGH (ref 4.0–10.5)
nRBC: 0 % (ref 0.0–0.2)

## 2019-03-15 LAB — COMPREHENSIVE METABOLIC PANEL
ALT: 17 U/L (ref 0–44)
AST: 21 U/L (ref 15–41)
Albumin: 2.8 g/dL — ABNORMAL LOW (ref 3.5–5.0)
Alkaline Phosphatase: 78 U/L (ref 38–126)
Anion gap: 11 (ref 5–15)
BUN: 36 mg/dL — ABNORMAL HIGH (ref 8–23)
CO2: 21 mmol/L — ABNORMAL LOW (ref 22–32)
Calcium: 8.3 mg/dL — ABNORMAL LOW (ref 8.9–10.3)
Chloride: 106 mmol/L (ref 98–111)
Creatinine, Ser: 2.41 mg/dL — ABNORMAL HIGH (ref 0.61–1.24)
GFR calc Af Amer: 26 mL/min — ABNORMAL LOW (ref >60)
GFR calc non Af Amer: 22 mL/min — ABNORMAL LOW (ref >60)
Glucose, Bld: 124 mg/dL — ABNORMAL HIGH (ref 70–99)
Potassium: 4.3 mmol/L (ref 3.5–5.1)
Sodium: 138 mmol/L (ref 135–145)
Total Bilirubin: 1 mg/dL (ref 0.3–1.2)
Total Protein: 6.4 g/dL — ABNORMAL LOW (ref 6.5–8.1)

## 2019-03-15 LAB — PROTIME-INR
INR: 1.4 — ABNORMAL HIGH (ref 0.8–1.2)
Prothrombin Time: 16.8 seconds — ABNORMAL HIGH (ref 11.4–15.2)

## 2019-03-15 LAB — TROPONIN I (HIGH SENSITIVITY): Troponin I (High Sensitivity): 22 ng/L — ABNORMAL HIGH (ref <18)

## 2019-03-15 LAB — D-DIMER, QUANTITATIVE: D-Dimer, Quant: 2.22 ug/mL-FEU — ABNORMAL HIGH (ref 0.00–0.50)

## 2019-03-15 LAB — POC SARS CORONAVIRUS 2 AG -  ED: SARS Coronavirus 2 Ag: NEGATIVE

## 2019-03-15 LAB — APTT: aPTT: 50 seconds — ABNORMAL HIGH (ref 24–36)

## 2019-03-15 LAB — LACTIC ACID, PLASMA: Lactic Acid, Venous: 1.7 mmol/L (ref 0.5–1.9)

## 2019-03-15 MED ORDER — SODIUM CHLORIDE 0.9 % IV SOLN
1.0000 g | Freq: Once | INTRAVENOUS | Status: AC
  Administered 2019-03-16: 1 g via INTRAVENOUS
  Filled 2019-03-15: qty 10

## 2019-03-15 NOTE — ED Provider Notes (Addendum)
[facility] DEPT Provider Note   CSN: [id number] Arrival date & time: 03/24/2019  2143     History No chief complaint on file.   Stanley Hunter is a 86 y.o. male.  The history is provided by the patient, the EMS personnel and medical records. No language interpreter was used.   Stanley Hunter is a 86 y.o. male who presents to the Emergency Department complaining of shortness of breath and fatigue. He [facility] emergency department [facility] of progressive shortness of breath and fatigue for the last two weeks. Symptoms started just after receiving his COVID-19 vaccine. He complains of generalized weakness with progressive shortness of breath and dyspnea on exertion. He is now unable to dress himself without significant shortness of breath. Over the last two days he has had low-grade fevers to 100.6. He complains of cough, nausea and decreased taste. He denies any abdominal pain, vomiting, diarrhea, dysuria. He has chronic lower extremity edema that is unchanged from baseline. He currently lives with his wife. She is not currently ill.    Past Medical History:  Diagnosis Date  . Acid reflux 08/03/2015  . Anxiety 08/03/2015  . Avitaminosis D 05/18/2012  . Benign essential HTN 02/09/2009   Overview:  Benign Essential Hypertension   . Benign prostatic hyperplasia with urinary obstruction 12/07/2013  . Chronic kidney disease (CKD), stage III (moderate) 11/04/2011  . CN (constipation) 08/03/2015  . Cognitive changes 08/07/2015  . Degenerative arthritis of hip 05/14/2013  . Degenerative arthritis of spine 05/14/2013  . Degenerative disorder of muscle 08/03/2015  . Dysrhythmia 2001   ablation for a-flutter  . H/O atrial flutter 08/03/2015   Overview:  Pacemaker placed 2015   . High blood pressure   . High cholesterol   . History of pacemaker 2016  . Hx of cardiac cath 2013  . Loss of balance 08/07/2015  . Lumbar canal stenosis 07/07/2013   Overview:  Lumbar laminectomy 07/07/13 - Dr. Lurene Shadow   . Lumbar scoliosis 05/14/2013  . Macular degeneration 08/07/2015  . Premature contractions, supraventricular 12/15/2008   Overview:  Atrial Premature Complex   . Presence of permanent cardiac pacemaker   . Tremor 08/07/2015   hands  . Urinary frequency 08/07/2015    Patient Active Problem List   Diagnosis Date Noted  . CAP (community acquired pneumonia) 03/16/2019  . Stage 3b chronic kidney disease 01/08/2019  . Bilateral leg edema 06/19/2018  . Allergic rhinitis 02/11/2018  . Sinus congestion 01/14/2018  . 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 Hyperlipidemia 08/12/2017  . Prediabetes 04/15/2017  . PND (post-nasal drip) 04/15/2017  . Mild cognitive impairment 04/15/2017  . Hypertensive heart and renal disease 01/14/2017  . Anemia of chronic disease 01/14/2017  . Sinusitis 07/25/2016  . Lipoma 05/09/2016  . Influenza B 04/21/2016  . Edema 02/01/2016  . Pain 11/24/2015  . Trigger ring finger of left hand 11/16/2015  . Tremor 08/07/2015  . Loss of balance 08/07/2015  . Macular degeneration 08/07/2015  . Urinary frequency 08/07/2015  . Cognitive impairment 08/07/2015  . History of pacemaker   . Anxiety 08/03/2015  . Constipation 08/03/2015  . Acid reflux 08/03/2015  . H/O atrial flutter 08/03/2015  . Myalgia 08/03/2015  . Benign prostatic hyperplasia with urinary frequency 12/07/2013  . Sick sinus syndrome ([place]) 11/18/2013  . Spinal stenosis of lumbar region with neurogenic claudication 07/07/2013  . Degenerative arthritis of hip 05/14/2013  . Degenerative arthritis of spine  05/14/2013  . Lumbar scoliosis 05/14/2013  . Avitaminosis D 05/18/2012  . CKD (chronic kidney disease) stage 3, GFR 30-59 ml/min 11/04/2011  . Benign hypertension with chronic kidney disease, stage III ([place]) 02/09/2009  . Premature contractions, supraventricular 12/15/2008  . Pure hypercholesterolemia 10/08/2006      Past Surgical History:  Procedure Laterality Date  . ATRIAL FLUTTER ABLATION  2001  . CATARACT EXTRACTION Bilateral 2015  . CHOLECYSTECTOMY    . COLONOSCOPY    . MASTOIDECTOMY  1934  . NASAL SEPTOPLASTY W/ TURBINOPLASTY Bilateral 10/14/2018   Procedure: NASAL SEPTOPLASTY WITH BILATERAL  TURBINATE REDUCTION;  Surgeon: Leta Baptist, MD;  Location: [facility];  Service: ENT;  Laterality: Bilateral;  . PACEMAKER PLACEMENT  2016  . SPINE SURGERY  2016   LUMBER- lower  . TONSILLECTOMY  1934  . TRIGGER FINGER RELEASE Left 12/14/2015   Procedure: RELEASE TRIGGER FINGER/A-1 PULLEY ring finger left;  Surgeon: Daryll Brod, MD;  Location: [facility];  Service: Orthopedics;  Laterality: Left;  FAB       History reviewed. No pertinent family history.  Social History   Tobacco Use  . Smoking status: Former Smoker    Years: 50.00    Types: Cigarettes    Quit date: 08/03/1975    Years since quitting: 43.6  . Smokeless tobacco: Never Used  . Tobacco comment: smoked 6 cig dialy   Substance Use Topics  . Alcohol use: Yes    Alcohol/week: 3.0 standard drinks    Types: 3 Standard drinks or equivalent per week    Comment: 3 times a week  . Drug use: No    Home Medications Prior to Admission medications   Medication Sig Start Date End Date Taking? Authorizing Provider  ALPRAZolam Duanne Moron) 0.5 MG tablet Take 0.5 mg by mouth at bedtime as needed for sleep.    Yes [provider]  amLODipine (NORVASC) 10 MG tablet TAKE 1 TABLET DAILY Patient taking differently: Take 10 mg by mouth daily.  01/18/19  Yes Virgie Dad, MD  aspirin EC 81 MG tablet Take 81 mg by mouth. Twice a week - Monday & Thursday   Yes [provider]  atorvastatin (LIPITOR) 20 MG tablet TAKE 1 TABLET DAILY Patient taking differently: Take 20 mg by mouth daily.  12/01/18  Yes Virgie Dad, MD  Cholecalciferol (VITAMIN D) 50 MCG (2000 UT) tablet Take 2,000 Units by mouth daily.   Yes [provider]  magnesium hydroxide (MILK OF MAGNESIA) 800 MG/5ML suspension Take 30 mLs by mouth as needed for constipation.   Yes [provider]  Multiple Vitamins-Minerals (PRESERVISION AREDS) CAPS Take 1 capsule by mouth 2 (two) times a day.    Yes [provider]  pantoprazole (PROTONIX) 20 MG tablet TAKE 1 TABLET DAILY Patient taking differently: Take 20 mg by mouth daily.  12/22/18  Yes Virgie Dad, MD  propafenone (RYTHMOL) 150 MG tablet Take 225 mg by mouth 4 (four) times daily.    Yes [provider]  tamsulosin (FLOMAX) 0.4 MG CAPS capsule TAKE 1 CAPSULE AT BEDTIME Patient taking differently: Take 0.4 mg by mouth at bedtime.  06/22/18  Yes Virgie Dad, MD    Allergies    Cortisone, Neomycin, and Sulfamethoxazole-trimethoprim  Review of Systems   Review of Systems  All other systems reviewed and are negative.   Physical Exam Updated Vital Signs BP 115/75   Pulse 93   Temp 99.5 F (37.5 C) (Oral)  Resp (!) 27   SpO2 92%   Physical Exam Vitals and nursing note reviewed.  Constitutional:      Appearance: He is well-developed.  HENT:     Head: Normocephalic and atraumatic.  Cardiovascular:     Rate and Rhythm: Normal rate. Rhythm irregular.     Heart sounds: No murmur.  Pulmonary:     Effort: Pulmonary effort is normal. No respiratory distress.     Comments: Occasional and expiratory wheezes in upper lung fields, decreased air movement in bilateral bases. Abdominal:     Palpations: Abdomen is soft.     Tenderness: There is no abdominal tenderness. There is no guarding or rebound.  Musculoskeletal:        General: No tenderness.     Comments: 2+ pitting edema to the right lower extremity.  Skin:    General: Skin is warm and dry.  Neurological:     Mental Status: He is alert and oriented to person, place, and time.     Comments: Generalized weakness  Psychiatric:        Behavior: Behavior normal.     ED Results /  Procedures / Treatments   Labs (all labs ordered are listed, but only abnormal results are displayed) Labs Reviewed  COMPREHENSIVE METABOLIC PANEL - Abnormal; Notable for the following components:      Result Value   CO2 21 (*)    Glucose, Bld 124 (*)    BUN 36 (*)    Creatinine, Ser 2.41 (*)    Calcium 8.3 (*)    Total Protein 6.4 (*)    Albumin 2.8 (*)    GFR calc non Af Amer 22 (*)    GFR calc Af Amer 26 (*)    All other components within normal limits  CBC WITH DIFFERENTIAL/PLATELET - Abnormal; Notable for the following components:   WBC 11.9 (*)    RBC 3.65 (*)    Hemoglobin 11.4 (*)    HCT 35.1 (*)    Neutro Abs 8.1 (*)    Monocytes Absolute 2.5 (*)    All other components within normal limits  APTT - Abnormal; Notable for the following components:   aPTT 50 (*)    All other components within normal limits  PROTIME-INR - Abnormal; Notable for the following components:   Prothrombin Time 16.8 (*)    INR 1.4 (*)    All other components within normal limits  D-DIMER, QUANTITATIVE (NOT AT [facility]) - Abnormal; Notable for the following components:   D-Dimer, Quant 2.22 (*)    All other components within normal limits  TROPONIN I (HIGH SENSITIVITY) - Abnormal; Notable for the following components:   Troponin I (High Sensitivity) 22 (*)    All other components within normal limits  CULTURE, BLOOD (ROUTINE X 2)  CULTURE, BLOOD (ROUTINE X 2)  URINE CULTURE  RESPIRATORY PANEL BY RT PCR (FLU A&B, COVID)  LACTIC ACID, PLASMA  LACTIC ACID, PLASMA  URINALYSIS, ROUTINE W REFLEX MICROSCOPIC  BRAIN NATRIURETIC PEPTIDE  POC SARS CORONAVIRUS 2 AG -  ED  TROPONIN I (HIGH SENSITIVITY)    EKG EKG Interpretation  Date/Time:  Monday March 15 2019 22:30:41 EST Ventricular Rate:  95 PR Interval:    QRS Duration: 114 QT Interval:  319 QTC Calculation: 401 R Axis:   11 Text Interpretation: Atrial fibrillation Incomplete right bundle branch block Probable anteroseptal infarct, old  Repol abnrm suggests ischemia, lateral leads Confirmed by Quintella Reichert [phone]) on 03/07/2019 10:44:04 PM   Radiology  DG Chest Port 1 View  Result Date: 03/02/2019 CLINICAL DATA:  Fever and shortness of breath. EXAM: PORTABLE CHEST 1 VIEW COMPARISON:  In April 21, 2016 FINDINGS: There is a dual lead AICD. Mild infiltrate is seen along the medial aspect of the right lung base. There is elevation of the right hemidiaphragm. No pleural effusion or pneumothorax is identified. The cardiac silhouette is moderately enlarged. Marked severity calcification of the aortic arch is seen. Multilevel degenerative changes are noted throughout the thoracic spine. IMPRESSION: 1. Mild right basilar infiltrate. 2. Cardiomegaly. Electronically Signed   By: Virgina Norfolk M.D.   On: 03/22/2019 22:46    Procedures Procedures (including critical care time) CRITICAL CARE Performed by: Quintella Reichert   Total critical care time: 35 minutes  Critical care time was exclusive of separately billable procedures and treating other patients.  Critical care was necessary to treat or prevent imminent or life-threatening deterioration.  Critical care was time spent personally by me on the following activities: development of treatment plan with patient and/or surrogate as well as nursing, discussions with consultants, evaluation of patient's response to treatment, examination of patient, obtaining history from patient or surrogate, ordering and performing treatments and interventions, ordering and review of laboratory studies, ordering and review of radiographic studies, pulse oximetry and re-evaluation of patient's condition.  Medications Ordered in ED Medications  cefTRIAXone (ROCEPHIN) 1 g in sodium chloride 0.9 % 100 mL IVPB (has no administration in time range)  doxycycline (VIBRA-TABS) tablet 100 mg (has no administration in time range)  albuterol (VENTOLIN HFA) 108 (90 Base) MCG/ACT inhaler 2 puff (has no  administration in time range)    ED Course  I have reviewed the triage vital signs and the nursing notes.  Pertinent labs & imaging results that were available during my care of the patient were reviewed by me and considered in my medical decision making (see chart for details).    MDM Rules/Calculators/A&P                     Patient here for evaluation of fevers, weakness for the last few days. He is non-toxic appearing on evaluation but does have new oxygen requirement. Chest x-ray with possible right lower lobe infiltrate. Will treat with antibiotics for possible pneumonia as well as send a COBIT 19 swab. He does have asymmetric lower extremity edema. Patient states this is chronic in nature but there is concern for possible DVT and PE. Unable to obtain CTA due to his underlying renal insufficiency. Will treat with heparin empirically pending further evaluation. Patient updated findings of studies and recommendation for admission and he is in agreement treatment plan. Hospitalist consulted for admission.  Demarkis Salminen was evaluated in Emergency Department on 03/16/2019 for the symptoms described in the history of present illness. He was evaluated in the context of the global COVID-19 pandemic, which necessitated consideration that the patient might be at risk for infection with the SARS-CoV-2 virus that causes COVID-19. Institutional protocols and algorithms that pertain to the evaluation of patients at risk for COVID-19 are in a state of rapid change based on information released by regulatory bodies including the CDC and federal and state organizations. These policies and algorithms were followed during the patient's care in the ED.   Final Clinical Impression(s) / ED Diagnoses Final diagnoses:  Community acquired pneumonia of right lower lobe of lung    Rx / DC Orders ED Discharge Orders    None  Quintella Reichert, MD 03/16/19 [id number]    Quintella Reichert, MD 03/30/19 0010

## 2019-03-15 NOTE — ED Triage Notes (Signed)
Pt found siting I on toilet having difficulty ambulating  C/o generalized weakness after his first COVID vaccine 1 week ago hx of afib and on demand pacemaker was afib on monitor.

## 2019-03-15 NOTE — ED Notes (Signed)
Urine output per condom cath is zero, room air O2 Sat 88% to 90%

## 2019-03-16 ENCOUNTER — Other Ambulatory Visit: Payer: Medicare Other

## 2019-03-16 ENCOUNTER — Inpatient Hospital Stay (HOSPITAL_COMMUNITY): Payer: Medicare Other

## 2019-03-16 DIAGNOSIS — J189 Pneumonia, unspecified organism: Secondary | ICD-10-CM | POA: Diagnosis present

## 2019-03-16 DIAGNOSIS — I1310 Hypertensive heart and chronic kidney disease without heart failure, with stage 1 through stage 4 chronic kidney disease, or unspecified chronic kidney disease: Secondary | ICD-10-CM | POA: Diagnosis not present

## 2019-03-16 DIAGNOSIS — E7800 Pure hypercholesterolemia, unspecified: Secondary | ICD-10-CM | POA: Diagnosis not present

## 2019-03-16 DIAGNOSIS — Z7189 Other specified counseling: Secondary | ICD-10-CM | POA: Diagnosis not present

## 2019-03-16 DIAGNOSIS — D638 Anemia in other chronic diseases classified elsewhere: Secondary | ICD-10-CM | POA: Diagnosis not present

## 2019-03-16 DIAGNOSIS — I130 Hypertensive heart and chronic kidney disease with heart failure and stage 1 through stage 4 chronic kidney disease, or unspecified chronic kidney disease: Secondary | ICD-10-CM | POA: Diagnosis present

## 2019-03-16 DIAGNOSIS — Z950 Presence of cardiac pacemaker: Secondary | ICD-10-CM

## 2019-03-16 DIAGNOSIS — R0602 Shortness of breath: Secondary | ICD-10-CM | POA: Diagnosis not present

## 2019-03-16 DIAGNOSIS — R05 Cough: Secondary | ICD-10-CM | POA: Diagnosis not present

## 2019-03-16 DIAGNOSIS — K1230 Oral mucositis (ulcerative), unspecified: Secondary | ICD-10-CM | POA: Diagnosis present

## 2019-03-16 DIAGNOSIS — E785 Hyperlipidemia, unspecified: Secondary | ICD-10-CM | POA: Diagnosis present

## 2019-03-16 DIAGNOSIS — N1832 Chronic kidney disease, stage 3b: Secondary | ICD-10-CM | POA: Diagnosis present

## 2019-03-16 DIAGNOSIS — I4892 Unspecified atrial flutter: Secondary | ICD-10-CM | POA: Diagnosis not present

## 2019-03-16 DIAGNOSIS — N179 Acute kidney failure, unspecified: Secondary | ICD-10-CM | POA: Diagnosis present

## 2019-03-16 DIAGNOSIS — Z66 Do not resuscitate: Secondary | ICD-10-CM | POA: Diagnosis present

## 2019-03-16 DIAGNOSIS — N400 Enlarged prostate without lower urinary tract symptoms: Secondary | ICD-10-CM | POA: Diagnosis present

## 2019-03-16 DIAGNOSIS — J948 Other specified pleural conditions: Secondary | ICD-10-CM | POA: Diagnosis not present

## 2019-03-16 DIAGNOSIS — R531 Weakness: Secondary | ICD-10-CM | POA: Diagnosis not present

## 2019-03-16 DIAGNOSIS — I4891 Unspecified atrial fibrillation: Secondary | ICD-10-CM

## 2019-03-16 DIAGNOSIS — I480 Paroxysmal atrial fibrillation: Secondary | ICD-10-CM | POA: Diagnosis present

## 2019-03-16 DIAGNOSIS — Z515 Encounter for palliative care: Secondary | ICD-10-CM | POA: Diagnosis not present

## 2019-03-16 DIAGNOSIS — J90 Pleural effusion, not elsewhere classified: Secondary | ICD-10-CM | POA: Diagnosis not present

## 2019-03-16 DIAGNOSIS — R918 Other nonspecific abnormal finding of lung field: Secondary | ICD-10-CM | POA: Diagnosis not present

## 2019-03-16 DIAGNOSIS — N390 Urinary tract infection, site not specified: Secondary | ICD-10-CM | POA: Diagnosis not present

## 2019-03-16 DIAGNOSIS — J918 Pleural effusion in other conditions classified elsewhere: Secondary | ICD-10-CM | POA: Diagnosis not present

## 2019-03-16 DIAGNOSIS — I495 Sick sinus syndrome: Secondary | ICD-10-CM | POA: Diagnosis present

## 2019-03-16 DIAGNOSIS — R509 Fever, unspecified: Secondary | ICD-10-CM | POA: Diagnosis not present

## 2019-03-16 DIAGNOSIS — D631 Anemia in chronic kidney disease: Secondary | ICD-10-CM | POA: Diagnosis present

## 2019-03-16 DIAGNOSIS — R609 Edema, unspecified: Secondary | ICD-10-CM

## 2019-03-16 DIAGNOSIS — I2510 Atherosclerotic heart disease of native coronary artery without angina pectoris: Secondary | ICD-10-CM | POA: Diagnosis present

## 2019-03-16 DIAGNOSIS — B957 Other staphylococcus as the cause of diseases classified elsewhere: Secondary | ICD-10-CM | POA: Diagnosis not present

## 2019-03-16 DIAGNOSIS — I509 Heart failure, unspecified: Secondary | ICD-10-CM | POA: Diagnosis present

## 2019-03-16 DIAGNOSIS — J181 Lobar pneumonia, unspecified organism: Secondary | ICD-10-CM | POA: Diagnosis present

## 2019-03-16 DIAGNOSIS — J9601 Acute respiratory failure with hypoxia: Secondary | ICD-10-CM | POA: Diagnosis present

## 2019-03-16 DIAGNOSIS — Z20822 Contact with and (suspected) exposure to covid-19: Secondary | ICD-10-CM | POA: Diagnosis present

## 2019-03-16 DIAGNOSIS — I4819 Other persistent atrial fibrillation: Secondary | ICD-10-CM | POA: Diagnosis present

## 2019-03-16 DIAGNOSIS — H3530 Unspecified macular degeneration: Secondary | ICD-10-CM | POA: Diagnosis present

## 2019-03-16 DIAGNOSIS — K219 Gastro-esophageal reflux disease without esophagitis: Secondary | ICD-10-CM | POA: Diagnosis present

## 2019-03-16 DIAGNOSIS — I4519 Other right bundle-branch block: Secondary | ICD-10-CM | POA: Diagnosis present

## 2019-03-16 LAB — BASIC METABOLIC PANEL
Anion gap: 11 (ref 5–15)
BUN: 28 mg/dL — ABNORMAL HIGH (ref 8–23)
CO2: 22 mmol/L (ref 22–32)
Calcium: 7.8 mg/dL — ABNORMAL LOW (ref 8.9–10.3)
Chloride: 107 mmol/L (ref 98–111)
Creatinine, Ser: 1.6 mg/dL — ABNORMAL HIGH (ref 0.61–1.24)
GFR calc Af Amer: 43 mL/min — ABNORMAL LOW (ref >60)
GFR calc non Af Amer: 37 mL/min — ABNORMAL LOW (ref >60)
Glucose, Bld: 118 mg/dL — ABNORMAL HIGH (ref 70–99)
Potassium: 3.9 mmol/L (ref 3.5–5.1)
Sodium: 140 mmol/L (ref 135–145)

## 2019-03-16 LAB — URINALYSIS, ROUTINE W REFLEX MICROSCOPIC
Bilirubin Urine: NEGATIVE
Glucose, UA: NEGATIVE mg/dL
Hgb urine dipstick: NEGATIVE
Ketones, ur: 5 mg/dL — AB
Leukocytes,Ua: NEGATIVE
Nitrite: NEGATIVE
Protein, ur: 30 mg/dL — AB
Specific Gravity, Urine: 1.024 (ref 1.005–1.030)
pH: 5 (ref 5.0–8.0)

## 2019-03-16 LAB — RESPIRATORY PANEL BY RT PCR (FLU A&B, COVID)
Influenza A by PCR: NEGATIVE
Influenza B by PCR: NEGATIVE
SARS Coronavirus 2 by RT PCR: NEGATIVE

## 2019-03-16 LAB — HEPARIN LEVEL (UNFRACTIONATED)
Heparin Unfractionated: 0.67 IU/mL (ref 0.30–0.70)
Heparin Unfractionated: 0.16 IU/mL — ABNORMAL LOW (ref 0.30–0.70)
Heparin Unfractionated: 0.14 IU/mL — ABNORMAL LOW (ref 0.30–0.70)

## 2019-03-16 LAB — CBC
HCT: 35.6 % — ABNORMAL LOW (ref 39.0–52.0)
Hemoglobin: 11.6 g/dL — ABNORMAL LOW (ref 13.0–17.0)
MCH: 31.3 pg (ref 26.0–34.0)
MCHC: 32.6 g/dL (ref 30.0–36.0)
MCV: 96 fL (ref 80.0–100.0)
Platelets: 184 10*3/uL (ref 150–400)
RBC: 3.71 MIL/uL — ABNORMAL LOW (ref 4.22–5.81)
RDW: 15.1 % (ref 11.5–15.5)
WBC: 12.2 10*3/uL — ABNORMAL HIGH (ref 4.0–10.5)
nRBC: 0 % (ref 0.0–0.2)

## 2019-03-16 LAB — MRSA PCR SCREENING: MRSA by PCR: POSITIVE — AB

## 2019-03-16 LAB — LACTIC ACID, PLASMA: Lactic Acid, Venous: 1.3 mmol/L (ref 0.5–1.9)

## 2019-03-16 LAB — TROPONIN I (HIGH SENSITIVITY): Troponin I (High Sensitivity): 17 ng/L (ref <18)

## 2019-03-16 LAB — BRAIN NATRIURETIC PEPTIDE: B Natriuretic Peptide: 434.8 pg/mL — ABNORMAL HIGH (ref 0.0–100.0)

## 2019-03-16 LAB — HIV ANTIBODY (ROUTINE TESTING W REFLEX): HIV Screen 4th Generation wRfx: NONREACTIVE

## 2019-03-16 LAB — STREP PNEUMONIAE URINARY ANTIGEN: Strep Pneumo Urinary Antigen: NEGATIVE

## 2019-03-16 MED ORDER — HEPARIN (PORCINE) 25000 UT/250ML-% IV SOLN
1150.0000 [IU]/h | INTRAVENOUS | Status: DC
  Administered 2019-03-16: 1150 [IU]/h via INTRAVENOUS
  Administered 2019-03-16: 1200 [IU]/h via INTRAVENOUS
  Filled 2019-03-16 (×2): qty 250

## 2019-03-16 MED ORDER — PANTOPRAZOLE SODIUM 20 MG PO TBEC
20.0000 mg | DELAYED_RELEASE_TABLET | Freq: Every day | ORAL | Status: DC
  Administered 2019-03-16 – 2019-03-21 (×6): 20 mg via ORAL
  Filled 2019-03-16 (×7): qty 1

## 2019-03-16 MED ORDER — LORAZEPAM 2 MG/ML IJ SOLN
0.5000 mg | Freq: Once | INTRAMUSCULAR | Status: AC
  Administered 2019-03-16: 0.5 mg via INTRAVENOUS
  Filled 2019-03-16: qty 1

## 2019-03-16 MED ORDER — CHLORHEXIDINE GLUCONATE CLOTH 2 % EX PADS
6.0000 | MEDICATED_PAD | Freq: Every day | CUTANEOUS | Status: DC
  Administered 2019-03-16 – 2019-03-22 (×7): 6 via TOPICAL

## 2019-03-16 MED ORDER — SODIUM CHLORIDE 0.9 % IV SOLN: INTRAVENOUS | Status: DC

## 2019-03-16 MED ORDER — DILTIAZEM HCL 25 MG/5ML IV SOLN
10.0000 mg | Freq: Once | INTRAVENOUS | Status: DC
  Filled 2019-03-16: qty 5

## 2019-03-16 MED ORDER — SODIUM CHLORIDE 0.9 % IV SOLN
500.0000 mg | INTRAVENOUS | Status: DC
  Administered 2019-03-16: 11:00:00 500 mg via INTRAVENOUS
  Filled 2019-03-16: qty 500

## 2019-03-16 MED ORDER — PROPAFENONE HCL 225 MG PO TABS
225.0000 mg | ORAL_TABLET | Freq: Four times a day (QID) | ORAL | Status: DC
  Administered 2019-03-16 – 2019-03-21 (×19): 225 mg via ORAL
  Filled 2019-03-16 (×24): qty 1

## 2019-03-16 MED ORDER — DOXYCYCLINE HYCLATE 100 MG PO TABS
100.0000 mg | ORAL_TABLET | Freq: Once | ORAL | Status: AC
  Administered 2019-03-16: 100 mg via ORAL
  Filled 2019-03-16: qty 1

## 2019-03-16 MED ORDER — ALBUTEROL SULFATE HFA 108 (90 BASE) MCG/ACT IN AERS
2.0000 | INHALATION_SPRAY | Freq: Once | RESPIRATORY_TRACT | Status: AC
  Administered 2019-03-16: 01:00:00 2 via RESPIRATORY_TRACT

## 2019-03-16 MED ORDER — ATORVASTATIN CALCIUM 10 MG PO TABS
20.0000 mg | ORAL_TABLET | Freq: Every day | ORAL | Status: DC
  Administered 2019-03-16 – 2019-03-20 (×5): 20 mg via ORAL
  Filled 2019-03-16 (×5): qty 2

## 2019-03-16 MED ORDER — PROPAFENONE HCL 225 MG PO TABS
225.0000 mg | ORAL_TABLET | Freq: Three times a day (TID) | ORAL | Status: DC
  Administered 2019-03-16: 225 mg via ORAL
  Filled 2019-03-16 (×2): qty 1

## 2019-03-16 MED ORDER — ALPRAZOLAM 0.5 MG PO TABS
0.5000 mg | ORAL_TABLET | Freq: Every evening | ORAL | Status: DC | PRN
  Administered 2019-03-17 – 2019-03-20 (×5): 0.5 mg via ORAL
  Filled 2019-03-16 (×6): qty 1

## 2019-03-16 MED ORDER — DILTIAZEM HCL-DEXTROSE 125-5 MG/125ML-% IV SOLN (PREMIX)
INTRAVENOUS | Status: AC
  Filled 2019-03-16: qty 125

## 2019-03-16 MED ORDER — SODIUM CHLORIDE 0.9 % IV SOLN
1.0000 g | INTRAVENOUS | Status: DC
  Administered 2019-03-16 – 2019-03-17 (×2): 1 g via INTRAVENOUS
  Filled 2019-03-16: qty 1
  Filled 2019-03-16: qty 10

## 2019-03-16 MED ORDER — TAMSULOSIN HCL 0.4 MG PO CAPS
0.4000 mg | ORAL_CAPSULE | Freq: Every day | ORAL | Status: DC
  Administered 2019-03-16 – 2019-03-20 (×5): 0.4 mg via ORAL
  Filled 2019-03-16 (×6): qty 1

## 2019-03-16 MED ORDER — DILTIAZEM LOAD VIA INFUSION
10.0000 mg | Freq: Once | INTRAVENOUS | Status: AC
  Administered 2019-03-16: 10 mg via INTRAVENOUS
  Filled 2019-03-16: qty 10

## 2019-03-16 MED ORDER — HEPARIN BOLUS VIA INFUSION
2000.0000 [IU] | Freq: Once | INTRAVENOUS | Status: AC
  Administered 2019-03-16: 02:00:00 2000 [IU] via INTRAVENOUS
  Filled 2019-03-16: qty 2000

## 2019-03-16 MED ORDER — DILTIAZEM HCL-DEXTROSE 125-5 MG/125ML-% IV SOLN (PREMIX)
5.0000 mg/h | INTRAVENOUS | Status: DC
  Administered 2019-03-16: 15 mg/h via INTRAVENOUS
  Administered 2019-03-16: 5 mg/h via INTRAVENOUS
  Administered 2019-03-17 – 2019-03-19 (×7): 15 mg/h via INTRAVENOUS
  Filled 2019-03-16 (×9): qty 125

## 2019-03-16 MED ORDER — MUPIROCIN 2 % EX OINT
1.0000 "application " | TOPICAL_OINTMENT | Freq: Two times a day (BID) | CUTANEOUS | Status: AC
  Administered 2019-03-16 – 2019-03-21 (×10): 1 via NASAL
  Filled 2019-03-16 (×3): qty 22

## 2019-03-16 NOTE — Progress Notes (Addendum)
Patient seen and examined, admitted by Dr. Jonelle Sidle this morning   Patient is a 86 year old male with CAD, chronic kidney disease stage III, essential hypertension, DJD, pacemaker in 2015, had Covid vaccine 2 weeks ago.  Patient presented with shortness of breath, coughing, fever, lower extremity edema, hypoxic, fever, 102 F.  Chest x-ray showed lobar pneumonia.  COVID-19, respiratory panel including influenza negative BP 111/60   Pulse (!) 137   Temp 98.2 F (36.8 C) (Oral)   Resp (!) 29   Ht 5\' 9"  (1.753 m)   Wt 75.8 kg   SpO2 92%   BMI 24.66 kg/m    On exam, Alert and oriented x3, NAD CVS: Irregularly irregular, tachycardia, RVR LUNGS: Bibasilar Rales with scattered rhonchi Abdomen: NT, ND, NBS Extremities: 1+ pitting edema  Labs reviewed EKG showed rate 141, atrial fibrillation with RVR Chest x-ray showed mild right basilar infiltrate, cardiomegaly  A/P Community-acquired pneumonia -Continue IV Zithromax, Rocephin -COVID-19 test negative, influenza panel negative. -  Blood cultures negative so far  Atrial fibrillation with RVR  -Discussed with the patient, states that he has a history of paroxysmal A. fib, had previous flutter ablation.  Pacemaker implantation secondary to SSS -Heart rate in 150s at the time of my encounter, ordered Cardizem 10 mg IV x1, started on Cardizem drip -Continue IV heparin drip -Obtain 2D echo for further work-up, serial troponin -Venous Dopplers LE, negative for DVT -Obtain BNP, possibly has fluid overload/CHF due to rapid A. fib with RVR.  DC IV fluids -On reviewing cardiology records, patient follows Dr. Bethel Born, cardiology at [facility].  Per office records, he is also on propafenone -Cardiology consulted.  AKI on CKD stage III Baseline creatinine 1.9 on 10/14/2018 Creatinine 2.41, the time of admission Patient received IV fluids hydration overnight, recheck bmet   Grayden Burley M.D. [facility] 03/16/2019, 12:09  PM

## 2019-03-16 NOTE — ED Notes (Addendum)
Patient called out for BM. Patient placed on bed pain, repositioned, changed, new brief, and new condom catheter placed. Patient resting.  Dr. Tana Coast MD made aware of patient HR and Cardizem at max. Waiting for cardiology to see patient. No new orders at this time.

## 2019-03-16 NOTE — H&P (Signed)
History and Physical   Jhavon Silvas [id number] DOB: 1924/11/17 DOA: 03/18/2019  Referring MD/NP/PA: Dr. Ralene Bathe  PCP: Mast, Man X, NP   Outpatient Specialists: None  Patient coming from: Home  Chief Complaint: Shortness of breath and fever  HPI: Dak Cheung is a 86 y.o. male with medical history significant of coronary artery disease status post. Cardiac catheterization, chronic kidney disease stage III, essential hypertension, degenerative disc joint disease, history of pacemaker placement 2015, who had his Covid vaccine 2 weeks ago and presents with shortness of breath cough and fever. Patient also has lower extremity edema. He was found to be hypoxic. He reported temperature 102 at home. Here he has a temperature of 99. Chest x-rays showed lobar pneumonia. Initial Covid antigen is negative and PCR is currently pending. Patient has had previous history of influenza and has not had any flu shot this year. Influenza screen is also pending at this point. He has no known other sick contacts. Patient is tachypneic with a fever and hypoxia highly suspicious for PE but has chronic kidney disease and unable to get contrasted CT. he has lobar pneumonia on x-ray.  He also has bilateral lower extremities edema which apparently has been there for years. Will be admitted for treatment of lobar pneumonia with possible PE and rule out COVID-19 pneumonia by PCR.  ED Course: Temperature is 99.5 blood pressure 111/68 pulse 100 respiratory 27 oxygen sat 88% on room air currently 96% on 2 L. White count is 11.9 hemoglobin 11.4 and platelet count of 181. Sodium is 138 potassium 4.3 chloride 106 CO2 21 BUN 36 creatinine 2.4 and calcium 8.3. Troponin is 22. D-dimer 2.22 PT 16.8 and INR 1.4. Glucose 124. COVID-19 antigen is negative. Chest x-ray showed right lobar pneumonia. Patient will be admitted on empiric treatment for PE as well as pneumonia community-acquired  Review of Systems: As per HPI otherwise 10 point  review of systems negative.    Past Medical History:  Diagnosis Date  . Acid reflux 08/03/2015  . Anxiety 08/03/2015  . Avitaminosis D 05/18/2012  . Benign essential HTN 02/09/2009   Overview:  Benign Essential Hypertension   . Benign prostatic hyperplasia with urinary obstruction 12/07/2013  . Chronic kidney disease (CKD), stage III (moderate) 11/04/2011  . CN (constipation) 08/03/2015  . Cognitive changes 08/07/2015  . Degenerative arthritis of hip 05/14/2013  . Degenerative arthritis of spine 05/14/2013  . Degenerative disorder of muscle 08/03/2015  . Dysrhythmia 2001   ablation for a-flutter  . H/O atrial flutter 08/03/2015   Overview:  Pacemaker placed 2015   . High blood pressure   . High cholesterol   . History of pacemaker 2016  . Hx of cardiac cath 2013  . Loss of balance 08/07/2015  . Lumbar canal stenosis 07/07/2013   Overview:  Lumbar laminectomy 07/07/13 - Dr. Lurene Shadow   . Lumbar scoliosis 05/14/2013  . Macular degeneration 08/07/2015  . Premature contractions, supraventricular 12/15/2008   Overview:  Atrial Premature Complex   . Presence of permanent cardiac pacemaker   . Tremor 08/07/2015   hands  . Urinary frequency 08/07/2015    Past Surgical History:  Procedure Laterality Date  . ATRIAL FLUTTER ABLATION  2001  . CATARACT EXTRACTION Bilateral 2015  . CHOLECYSTECTOMY    . COLONOSCOPY    . MASTOIDECTOMY  1934  . NASAL SEPTOPLASTY W/ TURBINOPLASTY Bilateral 10/14/2018   Procedure: NASAL SEPTOPLASTY WITH BILATERAL  TURBINATE REDUCTION;  Surgeon: Leta Baptist, MD;  Location: [facility];  Service: ENT;  Laterality: Bilateral;  . PACEMAKER PLACEMENT  2016  . SPINE SURGERY  2016   LUMBER- lower  . TONSILLECTOMY  1934  . TRIGGER FINGER RELEASE Left 12/14/2015   Procedure: RELEASE TRIGGER FINGER/A-1 PULLEY ring finger left;  Surgeon: Daryll Brod, MD;  Location: [facility];  Service: Orthopedics;  Laterality: Left;  FAB     reports that he quit smoking about 43 years ago.  His smoking use included cigarettes. He quit after 50.00 years of use. He has never used smokeless tobacco. He reports current alcohol use of about 3.0 standard drinks of alcohol per week. He reports that he does not use drugs.  Allergies  Allergen Reactions  . Cortisone Other (See Comments)    Unknown reaction  . Neomycin Rash  . Sulfamethoxazole-Trimethoprim Other (See Comments)    Leg weakness, arrhythmia    History reviewed. No pertinent family history.   Prior to Admission medications   Medication Sig Start Date End Date Taking? Authorizing Provider  ALPRAZolam Duanne Moron) 0.5 MG tablet Take 0.5 mg by mouth at bedtime as needed for sleep.     [provider]  amLODipine (NORVASC) 10 MG tablet TAKE 1 TABLET DAILY 01/18/19   Virgie Dad, MD  aspirin EC 81 MG tablet Take 81 mg by mouth. Twice a week    [provider]  atorvastatin (LIPITOR) 20 MG tablet TAKE 1 TABLET DAILY 12/01/18   Virgie Dad, MD  Cholecalciferol (VITAMIN D) 50 MCG (2000 UT) tablet Take 2,000 Units by mouth daily.    [provider]  magnesium hydroxide (MILK OF MAGNESIA) 800 MG/5ML suspension Take 30 mLs by mouth as needed for constipation.    [provider]  Multiple Vitamins-Minerals (PRESERVISION AREDS) CAPS Take 1 capsule by mouth 2 (two) times a day.     [provider]  pantoprazole (PROTONIX) 20 MG tablet TAKE 1 TABLET DAILY 12/22/18   Virgie Dad, MD  propafenone (RYTHMOL) 150 MG tablet Take 225 mg by mouth 4 (four) times daily.     [provider]  tamsulosin (FLOMAX) 0.4 MG CAPS capsule TAKE 1 CAPSULE AT BEDTIME Patient taking differently: Take 0.4 mg by mouth at bedtime.  06/22/18   Virgie Dad, MD    Physical Exam: Vitals:   02/26/2019 2202 03/02/2019 2244 02/27/2019 2300 03/28/2019 2342  BP:  115/63 111/68 115/75  Pulse:  100 96 93  Resp:  (!) 21 18 (!) 27  Temp:  99.5 F (37.5 C)    TempSrc:  Oral    SpO2: 96% (!) 89% (!) 88% 92%       Constitutional: Acutely ill looking, mildly dyspneic Vitals:   02/27/2019 2202 03/22/2019 2244 02/26/2019 2300 03/09/2019 2342  BP:  115/63 111/68 115/75  Pulse:  100 96 93  Resp:  (!) 21 18 (!) 27  Temp:  99.5 F (37.5 C)    TempSrc:  Oral    SpO2: 96% (!) 89% (!) 88% 92%   Eyes: PERRL, lids and conjunctivae normal ENMT: Mucous membranes are dry. Posterior pharynx clear of any exudate or lesions.Normal dentition.  Neck: normal, supple, no masses, no thyromegaly Respiratory: Decreased air entry bilaterally with some coarse breath sounds, bilateral rails, no significant wheeze. Normal respiratory effort. No accessory muscle use.  Cardiovascular: Sinus tachycardia, no murmurs / rubs / gallops. 1+ bilateral extremity edema. 2+ pedal pulses. No carotid bruits.  Abdomen: no tenderness, no masses palpated. No hepatosplenomegaly. Bowel sounds positive.  Musculoskeletal: no clubbing /  cyanosis. No joint deformity upper and lower extremities. Good ROM, no contractures. Normal muscle tone.  Skin: no rashes, lesions, ulcers. No induration Neurologic: CN 2-12 grossly intact. Sensation intact, DTR normal. Strength 5/5 in all 4.  Psychiatric: Slightly confused. Alert and oriented x 3. Normal mood.     Labs on Admission: I have personally reviewed following labs and imaging studies  CBC: Recent Labs  Lab 03/06/2019 2221  WBC 11.9*  NEUTROABS 8.1*  HGB 11.4*  HCT 35.1*  MCV 96.2  PLT 0000000   Basic Metabolic Panel: Recent Labs  Lab 03/24/2019 2221  NA 138  K 4.3  CL 106  CO2 21*  GLUCOSE 124*  BUN 36*  CREATININE 2.41*  CALCIUM 8.3*   GFR: CrCl cannot be calculated (Unknown ideal weight.). Liver Function Tests: Recent Labs  Lab 03/07/2019 2221  AST 21  ALT 17  ALKPHOS 78  BILITOT 1.0  PROT 6.4*  ALBUMIN 2.8*   No results for input(s): LIPASE, AMYLASE in the last 168 hours. No results for input(s): AMMONIA in the last 168 hours. Coagulation Profile: Recent Labs  Lab  02/27/2019 2221  INR 1.4*   Cardiac Enzymes: No results for input(s): CKTOTAL, CKMB, CKMBINDEX, TROPONINI in the last 168 hours. BNP (last 3 results) No results for input(s): PROBNP in the last 8760 hours. HbA1C: No results for input(s): HGBA1C in the last 72 hours. CBG: No results for input(s): GLUCAP in the last 168 hours. Lipid Profile: No results for input(s): CHOL, HDL, LDLCALC, TRIG, CHOLHDL, LDLDIRECT in the last 72 hours. Thyroid Function Tests: No results for input(s): TSH, T4TOTAL, FREET4, T3FREE, THYROIDAB in the last 72 hours. Anemia Panel: No results for input(s): VITAMINB12, FOLATE, FERRITIN, TIBC, IRON, RETICCTPCT in the last 72 hours. Urine analysis: No results found for: COLORURINE, APPEARANCEUR, LABSPEC, PHURINE, GLUCOSEU, HGBUR, BILIRUBINUR, KETONESUR, PROTEINUR, UROBILINOGEN, NITRITE, LEUKOCYTESUR Sepsis Labs: @LABRCNTIP (procalcitonin:4,lacticidven:4) )No results found for this or any previous visit (from the past 240 hour(s)).   Radiological Exams on Admission: DG Chest Port 1 View  Result Date: 03/20/2019 CLINICAL DATA:  Fever and shortness of breath. EXAM: PORTABLE CHEST 1 VIEW COMPARISON:  In April 21, 2016 FINDINGS: There is a dual lead AICD. Mild infiltrate is seen along the medial aspect of the right lung base. There is elevation of the right hemidiaphragm. No pleural effusion or pneumothorax is identified. The cardiac silhouette is moderately enlarged. Marked severity calcification of the aortic arch is seen. Multilevel degenerative changes are noted throughout the thoracic spine. IMPRESSION: 1. Mild right basilar infiltrate. 2. Cardiomegaly. Electronically Signed   By: Virgina Norfolk M.D.   On: 02/26/2019 22:46    EKG: Independently reviewed. It showed atrial fibrillation with a rate of 95, incomplete right bundle branch block.  Assessment/Plan Principal Problem:   CAP (community acquired pneumonia) Active Problems:   Pure hypercholesterolemia    History of pacemaker   Hypertensive heart and renal disease   Anemia of chronic disease   Hyperlipidemia   Stage 3b chronic kidney disease     #1 community-acquired pneumonia: Patient being admitted to the hospital with suspected pneumonia based on presentation. This could be also Covid pneumonia as patient has had fever. He had recent vaccination but too soon to be protective. Initiate IV Rocephin and Zithromax. If his Covid 19 PCR is positive we will initiate Covid specific treatments as well.  #2 possible VTE: Patient has right lower extremity swelling. We will get Doppler ultrasound bilaterally. Also consider VQ scan in the morning due to  4 renal function. Empirically initiate IV heparin drip.  #3 atrial fibrillation: Rate is controlled. Currently will be on heparin.  #4 chronic kidney disease stage III: Stable at baseline. Continue close monitoring.  #5 hypertension: Blood pressure at this point is controlled. Continue home regimen.  #6 hyperlipidemia: Continue home statin  #7 coronary artery disease: Patient has a pacemaker in place. Continue treatment   DVT prophylaxis: Heparin drip Code Status: Full code Family Communication: No family at bedside Disposition Plan: Home Consults called: None Admission status: Inpatient  Severity of Illness: The appropriate patient status for this patient is INPATIENT. Inpatient status is judged to be reasonable and necessary in order to provide the required intensity of service to ensure the patient's safety. The patient's presenting symptoms, physical exam findings, and initial radiographic and laboratory data in the context of their chronic comorbidities is felt to place them at high risk for further clinical deterioration. Furthermore, it is not anticipated that the patient will be medically stable for discharge from the hospital within 2 midnights of admission. The following factors support the patient status of inpatient.   " The  patient's presenting symptoms include fever shortness of. " The worrisome physical exam findings include coarse breath sounds bilaterally with some crackles. " The initial radiographic and laboratory data are worrisome because of pneumonia and mucositis. " The chronic co-morbidities include hypertension with chronic kidney disease.   * I certify that at the point of admission it is my clinical judgment that the patient will require inpatient hospital care spanning beyond 2 midnights from the point of admission due to high intensity of service, high risk for further deterioration and high frequency of surveillance required.Barbette Merino MD [facility] Pager [phone]  If 7PM-7AM, please contact night-coverage [email or web address] Password [email or web address]  03/16/2019, 12:20 AM

## 2019-03-16 NOTE — Progress Notes (Signed)
ANTICOAGULATION CONSULT NOTE - Initial Consult  Pharmacy Consult for IV heparin Indication: pulmonary embolus/DVT- r/o for both  Allergies  Allergen Reactions  . Cortisone Other (See Comments)    Unknown reaction  . Neomycin Rash  . Sulfamethoxazole-Trimethoprim Other (See Comments)    Leg weakness, arrhythmia    Patient Measurements:   Heparin Dosing Weight: actual (75.3 from 11/20)  Vital Signs: Temp: 99.5 F (37.5 C) (01/18 2244) Temp Source: Oral (01/18 2244) BP: 115/75 (01/18 2342) Pulse Rate: 93 (01/18 2342)  Labs: Recent Labs    03/01/2019 2221  HGB 11.4*  HCT 35.1*  PLT 181  APTT 50*  LABPROT 16.8*  INR 1.4*  CREATININE 2.41*  TROPONINIHS 22*    CrCl cannot be calculated (Unknown ideal weight.).   Medical History: Past Medical History:  Diagnosis Date  . Acid reflux 08/03/2015  . Anxiety 08/03/2015  . Avitaminosis D 05/18/2012  . Benign essential HTN 02/09/2009   Overview:  Benign Essential Hypertension   . Benign prostatic hyperplasia with urinary obstruction 12/07/2013  . Chronic kidney disease (CKD), stage III (moderate) 11/04/2011  . CN (constipation) 08/03/2015  . Cognitive changes 08/07/2015  . Degenerative arthritis of hip 05/14/2013  . Degenerative arthritis of spine 05/14/2013  . Degenerative disorder of muscle 08/03/2015  . Dysrhythmia 2001   ablation for a-flutter  . H/O atrial flutter 08/03/2015   Overview:  Pacemaker placed 2015   . High blood pressure   . High cholesterol   . History of pacemaker 2016  . Hx of cardiac cath 2013  . Loss of balance 08/07/2015  . Lumbar canal stenosis 07/07/2013   Overview:  Lumbar laminectomy 07/07/13 - Dr. Lurene Shadow   . Lumbar scoliosis 05/14/2013  . Macular degeneration 08/07/2015  . Premature contractions, supraventricular 12/15/2008   Overview:  Atrial Premature Complex   . Presence of permanent cardiac pacemaker   . Tremor 08/07/2015   hands  . Urinary frequency 08/07/2015    Medications:  Scheduled:  .  albuterol  2 puff Inhalation Once  . doxycycline  100 mg Oral Once   Infusions:  . cefTRIAXone (ROCEPHIN)  IV      Assessment: 87 yoM with SOB and fever. Elevated d-dimer = 2.22. IV heparin for r/o PE/DVT.    Goal of Therapy:  Heparin level 0.3-0.7 units/ml Monitor platelets by anticoagulation protocol: Yes   Plan:  Need new wt in Epic, will use wt from 11/20. Heparin 2000 unit IV bolus x1 Start drip at 1200 units/hr Daily CBC/HL Check 1st HL in 8 hours   Dorrene German 03/16/2019,12:18 AM

## 2019-03-16 NOTE — Progress Notes (Signed)
CRITICAL VALUE ALERT  Critical Value:  MRSA PCR positive  Date & Time Notied: 03/16/2019 2020  Provider Notified: MRSA PCR Positive Standing Orders Implemented  Orders Received/Actions taken: MRSA PCR Positive Standing Orders Implemented

## 2019-03-16 NOTE — Progress Notes (Signed)
Bilateral lower extremity venous duplex has been completed. Preliminary results can be found in CV Proc through chart review.   03/16/19 9:19 AM Stanley Hunter RVT

## 2019-03-16 NOTE — Consult Note (Signed)
Cardiology Consultation:   Patient ID: Quintez Sobieck MRN: [id number]; DOB: Aug 02, 1924  Admit date: 03/04/2019 Date of Consult: 03/16/2019  Primary Care Provider: Mast, Man X, NP  Primary Cardiologist:Dr. Norlene Duel)   Patient Profile:   Stanley Hunter is a 86 y.o. male with a hx of atrial flutter s/p ablation, sick sinus syndrome status post [employer] pacemaker, symptomatic PVC on Rythmol, hypertension, chronic kidney disease stage III and hyperlipidemia who is being seen today for the evaluation of atrial fibrillation at the request of Dr. Tana Coast.  He was last seen by primary cardiologist December 08, 2018.  Device was functioning normally at that time.  History of Present Illness:   Mr. Hunter presented with shortness of breath, cough and fever.  He was found hypoxic.  His x-ray showed lobar pneumonia.  Covid and influenza panel are negative.  No sick contact.  His symptoms started receiving COVID-19 vaccine last week.  Blood culture has been drawn.  He was noted tachycardic with rhythm of atrial fibrillation..  For community-acquired pneumonia.  Started on IV azithromycin and Rocephin.  Hemoglobin 11.6.  Pending echocardiogram.  D-dimer 2.2. creatinine 2.41 (baseline 1.4-1.6). On IV cardizem for rate control.  Reports some shortness of breath with activity at home however history unreliable as he is somewhat confused.  Heart Pathway Score:     Past Medical History:  Diagnosis Date  . Acid reflux 08/03/2015  . Anxiety 08/03/2015  . Avitaminosis D 05/18/2012  . Benign essential HTN 02/09/2009   Overview:  Benign Essential Hypertension   . Benign prostatic hyperplasia with urinary obstruction 12/07/2013  . Chronic kidney disease (CKD), stage III (moderate) 11/04/2011  . CN (constipation) 08/03/2015  . Cognitive changes 08/07/2015  . Degenerative arthritis of hip 05/14/2013  . Degenerative arthritis of spine 05/14/2013  . Degenerative disorder of muscle 08/03/2015  . Dysrhythmia 2001   ablation for  a-flutter  . H/O atrial flutter 08/03/2015   Overview:  Pacemaker placed 2015   . High blood pressure   . High cholesterol   . History of pacemaker 2016  . Hx of cardiac cath 2013  . Loss of balance 08/07/2015  . Lumbar canal stenosis 07/07/2013   Overview:  Lumbar laminectomy 07/07/13 - Dr. Lurene Shadow   . Lumbar scoliosis 05/14/2013  . Macular degeneration 08/07/2015  . Premature contractions, supraventricular 12/15/2008   Overview:  Atrial Premature Complex   . Presence of permanent cardiac pacemaker   . Tremor 08/07/2015   hands  . Urinary frequency 08/07/2015    Past Surgical History:  Procedure Laterality Date  . ATRIAL FLUTTER ABLATION  2001  . CATARACT EXTRACTION Bilateral 2015  . CHOLECYSTECTOMY    . COLONOSCOPY    . MASTOIDECTOMY  1934  . NASAL SEPTOPLASTY W/ TURBINOPLASTY Bilateral 10/14/2018   Procedure: NASAL SEPTOPLASTY WITH BILATERAL  TURBINATE REDUCTION;  Surgeon: Leta Baptist, MD;  Location: [facility];  Service: ENT;  Laterality: Bilateral;  . PACEMAKER PLACEMENT  2016  . SPINE SURGERY  2016   LUMBER- lower  . TONSILLECTOMY  1934  . TRIGGER FINGER RELEASE Left 12/14/2015   Procedure: RELEASE TRIGGER FINGER/A-1 PULLEY ring finger left;  Surgeon: Daryll Brod, MD;  Location: [facility];  Service: Orthopedics;  Laterality: Left;  FAB     Inpatient Medications: Scheduled Meds: . dilTIAZem HCl-Dextrose       Continuous Infusions: . azithromycin 500 mg (03/16/19 1120)  . [START ON 03/17/2019] cefTRIAXone (ROCEPHIN)  IV    . diltiazem (CARDIZEM) infusion 15 mg/hr (03/16/19  1109)  . heparin 1,200 Units/hr (03/16/19 0202)   PRN Meds:   Allergies:    Allergies  Allergen Reactions  . Cortisone Other (See Comments)    Unknown reaction  . Neomycin Rash  . Sulfamethoxazole-Trimethoprim Other (See Comments)    Leg weakness, arrhythmia    Social History:   Social History   Socioeconomic History  . Marital status: Married    Spouse name: Not on file  . Number  of children: Not on file  . Years of education: Not on file  . Highest education level: Not on file  Occupational History  . Not on file  Tobacco Use  . Smoking status: Former Smoker    Years: 50.00    Types: Cigarettes    Quit date: 08/03/1975    Years since quitting: 43.6  . Smokeless tobacco: Never Used  . Tobacco comment: smoked 6 cig dialy   Substance and Sexual Activity  . Alcohol use: Yes    Alcohol/week: 3.0 standard drinks    Types: 3 Standard drinks or equivalent per week    Comment: 3 times a week  . Drug use: No  . Sexual activity: Not on file  Other Topics Concern  . Not on file  Social History Narrative   Lives at [facility]  Since 4 /16/2016 with wife [name]   Diet: n/a   Caffeine: Yes   Married: yes, 1957   House: Yes, 2 persons   Pets: no pets    Current/Past profession: Optometrist    Exercise: No   Living Will: Yes   DNR: Yes   POA/HPOA: Yes   Walks with walker   Social Determinants of Health   Financial Resource Strain:   . Difficulty of Paying Living Expenses: Not on file  Food Insecurity:   . Worried About Charity fundraiser in the Last Year: Not on file  . Ran Out of Food in the Last Year: Not on file  Transportation Needs:   . Lack of Transportation (Medical): Not on file  . Lack of Transportation (Non-Medical): Not on file  Physical Activity:   . Days of Exercise per Week: Not on file  . Minutes of Exercise per Session: Not on file  Stress:   . Feeling of Stress : Not on file  Social Connections:   . Frequency of Communication with Friends and Family: Not on file  . Frequency of Social Gatherings with Friends and Family: Not on file  . Attends Religious Services: Not on file  . Active Member of Clubs or Organizations: Not on file  . Attends Archivist Meetings: Not on file  . Marital Status: Not on file  Intimate Partner Violence:   . Fear of Current or Ex-Partner: Not on file  . Emotionally Abused: Not on file  .  Physically Abused: Not on file  . Sexually Abused: Not on file    Family History:   Patient is unable to provide family history due to confusion  ROS:  Please see the history of present illness.  All other ROS reviewed and negative.     Physical Exam/Data:   Vitals:   03/16/19 1110 03/16/19 1146 03/16/19 1150 03/16/19 1210  BP: 111/60 137/84 129/76   Pulse: (!) 137 (!) 123 (!) 139 (!) 132  Resp: (!) 29 (!) 26 (!) 29 (!) 25  Temp:      TempSrc:      SpO2: 92% 91% 92% 92%  Weight:  Height:        Intake/Output Summary (Last 24 hours) at 03/16/2019 1215 Last data filed at 03/16/2019 1158 Gross per 24 hour  Intake 47.5 ml  Output 0 ml  Net 47.5 ml   Last 3 Weights 03/16/2019 01/08/2019 10/14/2018  Weight (lbs) 167 lb 167 lb 3.2 oz 165 lb  Weight (kg) 75.751 kg 75.841 kg 74.844 kg     Body mass index is 24.66 kg/m.  General: Ill-appearing thin frail male in no acute distress HEENT: normal Lymph: no adenopathy Neck: no JVD Endocrine:  No thryomegaly Vascular: No carotid bruits; FA pulses 2+ bilaterally without bruits  Cardiac:  normal S1, S2; Ir IR tachycardia; no murmur  Lungs:  clear to auscultation bilaterally, no wheezing, rhonchi or rales  Abd: soft, nontender, no hepatomegaly  Ext: no edema Musculoskeletal:  No deformities, BUE and BLE strength normal and equal Skin: warm and dry  Neuro:  CNs 2-12 intact, no focal abnormalities noted Psych:  Normal affect   EKG:  The EKG was personally reviewed and demonstrates: Atrial fibrillation at rate of 141 bpm Telemetry:  Telemetry was personally reviewed and demonstrates:  afib at rate of 140s  Relevant CV Studies: Pending echo   Laboratory Data:  High Sensitivity Troponin:   Recent Labs  Lab 03/08/2019 2221 03/16/19 0222  TROPONINIHS 22* 17     Chemistry Recent Labs  Lab 03/12/2019 2221  NA 138  K 4.3  CL 106  CO2 21*  GLUCOSE 124*  BUN 36*  CREATININE 2.41*  CALCIUM 8.3*  GFRNONAA 22*  GFRAA 26*    ANIONGAP 11    Recent Labs  Lab 03/01/2019 2221  PROT 6.4*  ALBUMIN 2.8*  AST 21  ALT 17  ALKPHOS 78  BILITOT 1.0   Hematology Recent Labs  Lab 03/11/2019 2221 03/16/19 1115  WBC 11.9* 12.2*  RBC 3.65* 3.71*  HGB 11.4* 11.6*  HCT 35.1* 35.6*  MCV 96.2 96.0  MCH 31.2 31.3  MCHC 32.5 32.6  RDW 15.2 15.1  PLT 181 184   BNPNo results for input(s): BNP, PROBNP in the last 168 hours.  DDimer  Recent Labs  Lab 03/21/2019 2221  DDIMER 2.22*     Radiology/Studies:  DG Chest Port 1 View  Result Date: 03/18/2019 CLINICAL DATA:  Fever and shortness of breath. EXAM: PORTABLE CHEST 1 VIEW COMPARISON:  In April 21, 2016 FINDINGS: There is a dual lead AICD. Mild infiltrate is seen along the medial aspect of the right lung base. There is elevation of the right hemidiaphragm. No pleural effusion or pneumothorax is identified. The cardiac silhouette is moderately enlarged. Marked severity calcification of the aortic arch is seen. Multilevel degenerative changes are noted throughout the thoracic spine. IMPRESSION: 1. Mild right basilar infiltrate. 2. Cardiomegaly. Electronically Signed   By: Virgina Norfolk M.D.   On: 03/12/2019 22:46   VAS [place] LOWER EXTREMITY VENOUS (DVT)  Result Date: 03/16/2019  Lower Venous Study Indications: Edema.  Risk Factors: None identified. Limitations: Patient positioning, patient movement. Comparison Study: No prior studies. Performing Technologist: Oliver Hum RVT  Examination Guidelines: A complete evaluation includes B-mode imaging, spectral Doppler, color Doppler, and power Doppler as needed of all accessible portions of each vessel. Bilateral testing is considered an integral part of a complete examination. Limited examinations for reoccurring indications may be performed as noted.  +---------+---------------+---------+-----------+----------+--------------+ RIGHT    CompressibilityPhasicitySpontaneityPropertiesThrombus Aging  +---------+---------------+---------+-----------+----------+--------------+ CFV      Full  Yes      Yes                                 +---------+---------------+---------+-----------+----------+--------------+ SFJ      Full                                                        +---------+---------------+---------+-----------+----------+--------------+ FV Prox  Full                                                        +---------+---------------+---------+-----------+----------+--------------+ FV Mid   Full                                                        +---------+---------------+---------+-----------+----------+--------------+ FV DistalFull                                                        +---------+---------------+---------+-----------+----------+--------------+ PFV      Full                                                        +---------+---------------+---------+-----------+----------+--------------+ POP      Full           Yes      Yes                                 +---------+---------------+---------+-----------+----------+--------------+ PTV      Full                                                        +---------+---------------+---------+-----------+----------+--------------+ PERO     Full                                                        +---------+---------------+---------+-----------+----------+--------------+   +---------+---------------+---------+-----------+----------+--------------+ LEFT     CompressibilityPhasicitySpontaneityPropertiesThrombus Aging +---------+---------------+---------+-----------+----------+--------------+ CFV      Full           Yes      Yes                                 +---------+---------------+---------+-----------+----------+--------------+ SFJ  Full                                                         +---------+---------------+---------+-----------+----------+--------------+ FV Prox  Full                                                        +---------+---------------+---------+-----------+----------+--------------+ FV Mid   Full                                                        +---------+---------------+---------+-----------+----------+--------------+ FV DistalFull                                                        +---------+---------------+---------+-----------+----------+--------------+ PFV      Full                                                        +---------+---------------+---------+-----------+----------+--------------+ POP      Full           Yes      Yes                                 +---------+---------------+---------+-----------+----------+--------------+ PTV      Full                                                        +---------+---------------+---------+-----------+----------+--------------+ PERO     Full                                                        +---------+---------------+---------+-----------+----------+--------------+     Summary: Right: There is no evidence of deep vein thrombosis in the lower extremity. No cystic structure found in the popliteal fossa. Left: There is no evidence of deep vein thrombosis in the lower extremity. No cystic structure found in the popliteal fossa.  *See table(s) above for measurements and observations.    Preliminary      Assessment and Plan:   1. Atrial fibrillation with rapid ventricular rate -He has history of a flutter status post ablation and symptomatic PVC on propafenone - He is not on anticoagulation chronically -Heart rate in 140 he is on maximum dose of IV Cardizem - CHADSVASCs score of 3 for age and HTN -  Consider resuming Propafenone (MD to review) -Pending echocardiogram  2.  Pneumonia -Covid and influenza panel negative -Per primary team  3.  Acute on  chronic kidney disease -Follow with daily BMET  4. HTN - BP stable. On IV cardizem. Home regimen on hold  5. SSS s/p Pacemaker   Dr. Debara Pickett to see For questions or updates, please contact [facility] Please consult [email or web address] for contact info under     Jarrett Soho, PA  03/16/2019 12:16 PM

## 2019-03-16 NOTE — Progress Notes (Signed)
PHARMACY - HEPARIN (brief note)  IV Heparin gtt infusing @ 1150 units/hr 17:59 heparin level = 0.16 (previous heparin level therapeutic on same rate)  Noted record of patient removing IV where heparin was infusing.  RN note @ 14:51 that heparin was infusing again.  Heparin level reflective of interruption of therapy and being drawn before at steady state level.  Plan: Continue IV heparin @ 1150 units/hr Will recheck heparin level @ 2300 (8 hr after heparin infusion resumed).  Leone Haven, PharmD

## 2019-03-16 NOTE — ED Notes (Addendum)
Patient pulled IV out of left hand. New IV placed in left hand.   Heparin running through left hand and Cardizem running through right forearm IV.    Patient repositioned and brief checked. Condom catheter applied twice today and will not stay on. Call bell within reach.

## 2019-03-16 NOTE — Progress Notes (Signed)
ANTICOAGULATION CONSULT NOTE - Follow up  Pharmacy Consult for heparin  Indication: atrial fibrillation  Allergies  Allergen Reactions  . Cortisone Other (See Comments)    Unknown reaction  . Neomycin Rash  . Sulfamethoxazole-Trimethoprim Other (See Comments)    Leg weakness, arrhythmia    Patient Measurements: Height: 5\' 9"  (175.3 cm) Weight: 167 lb (75.8 kg) IBW/kg (Calculated) : 70.7 Heparin Dosing Weight: 76 kg  Vital Signs: Temp: 98.2 F (36.8 C) (01/19 0223) Temp Source: Oral (01/19 0223) BP: 115/94 (01/19 1230) Pulse Rate: 134 (01/19 1230)  Labs: Recent Labs    03/10/2019 2221 03/16/19 0222 03/16/19 1115 03/16/19 1157 03/16/19 1211  HGB 11.4*  --  11.6*  --   --   HCT 35.1*  --  35.6*  --   --   PLT 181  --  184  --   --   APTT 50*  --   --   --   --   LABPROT 16.8*  --   --   --   --   INR 1.4*  --   --   --   --   HEPARINUNFRC  --   --   --  0.67  --   CREATININE 2.41*  --   --   --  1.60*  TROPONINIHS 22* 17  --   --   --     Estimated Creatinine Clearance: 29.5 mL/min (A) (by C-G formula based on SCr of 1.6 mg/dL (H)).   Medical History: Past Medical History:  Diagnosis Date  . Acid reflux 08/03/2015  . Anxiety 08/03/2015  . Avitaminosis D 05/18/2012  . Benign essential HTN 02/09/2009   Overview:  Benign Essential Hypertension   . Benign prostatic hyperplasia with urinary obstruction 12/07/2013  . Chronic kidney disease (CKD), stage III (moderate) 11/04/2011  . CN (constipation) 08/03/2015  . Cognitive changes 08/07/2015  . Degenerative arthritis of hip 05/14/2013  . Degenerative arthritis of spine 05/14/2013  . Degenerative disorder of muscle 08/03/2015  . Dysrhythmia 2001   ablation for a-flutter  . H/O atrial flutter 08/03/2015   Overview:  Pacemaker placed 2015   . High blood pressure   . High cholesterol   . History of pacemaker 2016  . Hx of cardiac cath 2013  . Loss of balance 08/07/2015  . Lumbar canal stenosis 07/07/2013   Overview:  Lumbar  laminectomy 07/07/13 - Dr. Lurene Shadow   . Lumbar scoliosis 05/14/2013  . Macular degeneration 08/07/2015  . Premature contractions, supraventricular 12/15/2008   Overview:  Atrial Premature Complex   . Presence of permanent cardiac pacemaker   . Tremor 08/07/2015   hands  . Urinary frequency 08/07/2015   Assessment: 86 yo M presents with SOB and fever. Concern for VTE to started on heparin gtt. Dopplers negative, VQ/CT chest has not been ordered yet. Ddimer slightly elevated at 2.2. Also in Afib with RVR. Has a hx of Afib but not on anticoag PTA. First heparin level therapeutic at 0.67. Hgb 11.6, plts wnl.  Goal of Therapy:  Heparin level 0.3-0.7 units/ml Monitor platelets by anticoagulation protocol: Yes   Plan:  Decrease heparin gtt slightly to 1,150 units/hr Check confirmatory heparin level this evening Monitor daily heparin level CBC, s/s of bleed F/u Cards recs, need for VQ scan/CT chest to r/o PE  Erykah Lippert J 03/16/2019,12:41 PM

## 2019-03-16 NOTE — ED Notes (Signed)
Stanley Hunter, wife would like update on her husband [phone].

## 2019-03-16 NOTE — Progress Notes (Signed)
Attempted echo.  HR is elevated at the moment (140 + bpm)

## 2019-03-16 NOTE — ED Notes (Signed)
Cardiology at bedside.

## 2019-03-17 ENCOUNTER — Inpatient Hospital Stay (HOSPITAL_COMMUNITY): Payer: Medicare Other

## 2019-03-17 DIAGNOSIS — E7800 Pure hypercholesterolemia, unspecified: Secondary | ICD-10-CM

## 2019-03-17 DIAGNOSIS — E785 Hyperlipidemia, unspecified: Secondary | ICD-10-CM

## 2019-03-17 DIAGNOSIS — I4819 Other persistent atrial fibrillation: Secondary | ICD-10-CM

## 2019-03-17 LAB — ECHOCARDIOGRAM COMPLETE
Height: 69 in
Weight: 2613.77 oz

## 2019-03-17 LAB — URINE CULTURE: Culture: 2000 — AB

## 2019-03-17 LAB — BASIC METABOLIC PANEL
Anion gap: 11 (ref 5–15)
BUN: 26 mg/dL — ABNORMAL HIGH (ref 8–23)
CO2: 19 mmol/L — ABNORMAL LOW (ref 22–32)
Calcium: 8 mg/dL — ABNORMAL LOW (ref 8.9–10.3)
Chloride: 109 mmol/L (ref 98–111)
Creatinine, Ser: 1.33 mg/dL — ABNORMAL HIGH (ref 0.61–1.24)
GFR calc Af Amer: 53 mL/min — ABNORMAL LOW (ref >60)
GFR calc non Af Amer: 46 mL/min — ABNORMAL LOW (ref >60)
Glucose, Bld: 113 mg/dL — ABNORMAL HIGH (ref 70–99)
Potassium: 3.9 mmol/L (ref 3.5–5.1)
Sodium: 139 mmol/L (ref 135–145)

## 2019-03-17 LAB — CBC
HCT: 35.6 % — ABNORMAL LOW (ref 39.0–52.0)
Hemoglobin: 11.3 g/dL — ABNORMAL LOW (ref 13.0–17.0)
MCH: 30.5 pg (ref 26.0–34.0)
MCHC: 31.7 g/dL (ref 30.0–36.0)
MCV: 96.2 fL (ref 80.0–100.0)
Platelets: 199 10*3/uL (ref 150–400)
RBC: 3.7 MIL/uL — ABNORMAL LOW (ref 4.22–5.81)
RDW: 14.9 % (ref 11.5–15.5)
WBC: 10.5 10*3/uL (ref 4.0–10.5)
nRBC: 0 % (ref 0.0–0.2)

## 2019-03-17 LAB — MAGNESIUM: Magnesium: 2 mg/dL (ref 1.7–2.4)

## 2019-03-17 LAB — HEPARIN LEVEL (UNFRACTIONATED): Heparin Unfractionated: 0.17 IU/mL — ABNORMAL LOW (ref 0.30–0.70)

## 2019-03-17 MED ORDER — LEVALBUTEROL HCL 0.63 MG/3ML IN NEBU: 0.6300 mg | INHALATION_SOLUTION | RESPIRATORY_TRACT | Status: DC | PRN

## 2019-03-17 MED ORDER — APIXABAN 5 MG PO TABS
5.0000 mg | ORAL_TABLET | Freq: Two times a day (BID) | ORAL | Status: DC
  Administered 2019-03-17 – 2019-03-20 (×6): 5 mg via ORAL
  Filled 2019-03-17 (×7): qty 1

## 2019-03-17 MED ORDER — LEVALBUTEROL HCL 0.63 MG/3ML IN NEBU
0.6300 mg | INHALATION_SOLUTION | Freq: Three times a day (TID) | RESPIRATORY_TRACT | Status: DC
  Administered 2019-03-18 (×2): 0.63 mg via RESPIRATORY_TRACT
  Filled 2019-03-17 (×2): qty 3

## 2019-03-17 MED ORDER — ORAL CARE MOUTH RINSE
15.0000 mL | Freq: Two times a day (BID) | OROMUCOSAL | Status: DC
  Administered 2019-03-17 – 2019-03-22 (×11): 15 mL via OROMUCOSAL

## 2019-03-17 MED ORDER — HEPARIN BOLUS VIA INFUSION
2000.0000 [IU] | Freq: Once | INTRAVENOUS | Status: AC
  Administered 2019-03-17: 11:00:00 2000 [IU] via INTRAVENOUS
  Filled 2019-03-17: qty 2000

## 2019-03-17 MED ORDER — LEVALBUTEROL HCL 0.63 MG/3ML IN NEBU
0.6300 mg | INHALATION_SOLUTION | Freq: Three times a day (TID) | RESPIRATORY_TRACT | Status: DC
  Administered 2019-03-17 (×2): 0.63 mg via RESPIRATORY_TRACT
  Filled 2019-03-17 (×2): qty 3

## 2019-03-17 MED ORDER — AZITHROMYCIN 250 MG PO TABS
500.0000 mg | ORAL_TABLET | Freq: Every day | ORAL | Status: DC
  Administered 2019-03-17 – 2019-03-21 (×5): 500 mg via ORAL
  Filled 2019-03-17 (×5): qty 2

## 2019-03-17 MED ORDER — HEPARIN (PORCINE) 25000 UT/250ML-% IV SOLN: 1300.0000 [IU]/h | INTRAVENOUS | Status: DC

## 2019-03-17 NOTE — Progress Notes (Signed)
PROGRESS NOTE  Stanley Hunter [id number] DOB: November 04, 1924 DOA: 03/02/2019 PCP: Mast, Man X, NP  HPI/Recap of past 24 hours: HPI from Dr Ladona Ridgel is a 86 y.o. male with medical history significant of coronary artery disease status post. Cardiac catheterization, chronic kidney disease stage III, essential hypertension, degenerative disc joint disease, history of pacemaker placement 2015, who had his Covid vaccine 2 weeks ago and presents with shortness of breath, cough and fever. Patient also has lower extremity edema. He was found to be hypoxic. Chest x-ray showed lobar pneumonia. In the ED, patient noted to be hypoxic, Covid test negative.  Patient admitted for further management    Today, patient noted to still be SOB, requiring about 5 L of oxygen, denies any chest pain, abdominal pain, nausea/vomiting, fever/chills.     Assessment/Plan: Principal Problem:   CAP (community acquired pneumonia) Active Problems:   Pure hypercholesterolemia   History of pacemaker   Hypertensive heart and renal disease   Anemia of chronic disease   Hyperlipidemia   Stage 3b chronic kidney disease   Acute hypoxic respiratory failure likely 2/2 ?CAP, r/o PE Currently still requiring about 5L of O2 Afebrile, with no leukocytosis Urine strep pneumo negative COVID-19, influenza panel negative Chest x-ray with right basilar infiltrate Unable to do CTA due to CKD, will order V/Q scan Continue azithromycin, ceftriaxone, DuoNeb Supplemental O2 as needed Monitor closely  A. fib with RVR HR uncontrolled Had previous flutter ablation, pacemaker implantation secondary to SSS Venous Dopplers lower extremity negative for DVT Cardiology on board Continue Cardizem drip, heparin drip, propafenone  ?CHF Elevated BNP, 434.8 Echo pending  AKI on CKD stage IIIb Improving Baseline creatinine 1.9 Daily BMP  Hypertension Hold home Norvasc Continue diltiazem drip  Hyperlipidemia Continue  statins  GERD Continue PPI  BPH Continue Flomax  GOC Palliative consulted         Malnutrition Type:      Malnutrition Characteristics:      Nutrition Interventions:       Estimated body mass index is 24.12 kg/m as calculated from the following:   Height as of this encounter: 5\' 9"  (1.753 m).   Weight as of this encounter: 74.1 kg.     Code Status: Full  Family Communication: None at bedside  Disposition Plan: To be determined   Consultants:  Cardiology  Procedures:  None  Antimicrobials:  Ceftriaxone  Azithromycin  DVT prophylaxis: Heparin drip   Objective: Vitals:   03/17/19 0500 03/17/19 0800 03/17/19 0900 03/17/19 1000  BP: 113/78  (!) 145/88 139/86  Pulse: (!) 111  95 (!) 114  Resp: (!) 29  (!) 30 (!) 43  Temp:  97.6 F (36.4 C)    TempSrc:  Oral    SpO2: 96%  94% 95%  Weight:      Height:        Intake/Output Summary (Last 24 hours) at 03/17/2019 1102 Last data filed at 03/17/2019 0600 Gross per 24 hour  Intake 756.76 ml  Output 350 ml  Net 406.76 ml   Filed Weights   03/16/19 0222 03/16/19 1653  Weight: 75.8 kg 74.1 kg    Exam:  General: NAD   Cardiovascular: S1, S2 present  Respiratory:  Rhonchi noted throughout bilaterally  Abdomen: Soft, nontender, nondistended, bowel sounds present  Musculoskeletal: No bilateral pedal edema noted  Skin: Normal  Psychiatry: Normal mood   Data Reviewed: CBC: Recent Labs  Lab 03/13/2019 2221 03/16/19 1115 03/17/19 0129  WBC 11.9* 12.2* 10.5  NEUTROABS 8.1*  --   --   HGB 11.4* 11.6* 11.3*  HCT 35.1* 35.6* 35.6*  MCV 96.2 96.0 96.2  PLT 181 184 [id number]   Basic Metabolic Panel: Recent Labs  Lab 03/21/2019 2221 03/16/19 1211 03/17/19 0129  NA 138 140 139  K 4.3 3.9 3.9  CL 106 107 109  CO2 21* 22 19*  GLUCOSE 124* 118* 113*  BUN 36* 28* 26*  CREATININE 2.41* 1.60* 1.33*  CALCIUM 8.3* 7.8* 8.0*  MG  --   --  2.0   GFR: Estimated Creatinine Clearance:  35.4 mL/min (A) (by C-G formula based on SCr of 1.33 mg/dL (H)). Liver Function Tests: Recent Labs  Lab 03/22/2019 2221  AST 21  ALT 17  ALKPHOS 78  BILITOT 1.0  PROT 6.4*  ALBUMIN 2.8*   No results for input(s): LIPASE, AMYLASE in the last 168 hours. No results for input(s): AMMONIA in the last 168 hours. Coagulation Profile: Recent Labs  Lab 03/20/2019 2221  INR 1.4*   Cardiac Enzymes: No results for input(s): CKTOTAL, CKMB, CKMBINDEX, TROPONINI in the last 168 hours. BNP (last 3 results) No results for input(s): PROBNP in the last 8760 hours. HbA1C: No results for input(s): HGBA1C in the last 72 hours. CBG: No results for input(s): GLUCAP in the last 168 hours. Lipid Profile: No results for input(s): CHOL, HDL, LDLCALC, TRIG, CHOLHDL, LDLDIRECT in the last 72 hours. Thyroid Function Tests: No results for input(s): TSH, T4TOTAL, FREET4, T3FREE, THYROIDAB in the last 72 hours. Anemia Panel: No results for input(s): VITAMINB12, FOLATE, FERRITIN, TIBC, IRON, RETICCTPCT in the last 72 hours. Urine analysis:    Component Value Date/Time   COLORURINE YELLOW 03/16/2019 0222   APPEARANCEUR HAZY (A) 03/16/2019 0222   LABSPEC 1.024 03/16/2019 0222   PHURINE 5.0 03/16/2019 0222   GLUCOSEU NEGATIVE 03/16/2019 0222   HGBUR NEGATIVE 03/16/2019 0222   BILIRUBINUR NEGATIVE 03/16/2019 0222   KETONESUR 5 (A) 03/16/2019 0222   PROTEINUR 30 (A) 03/16/2019 0222   NITRITE NEGATIVE 03/16/2019 0222   LEUKOCYTESUR NEGATIVE 03/16/2019 0222   Sepsis Labs: @LABRCNTIP (procalcitonin:4,lacticidven:4)  ) Recent Results (from the past 240 hour(s))  Blood Culture (routine x 2)     Status: None (Preliminary result)   Collection Time: 03/22/2019 10:21 PM   Specimen: BLOOD  Result Value Ref Range Status   Specimen Description   Final    BLOOD BLOOD RIGHT ARM Performed at [facility], [place] [address] [place], [place] [address]    Special Requests   Final    BOTTLES DRAWN  AEROBIC AND ANAEROBIC Blood Culture adequate volume Performed at [facility] [address], [place], [place] [address]    Culture   Final    NO GROWTH 1 DAY Performed at [facility], [place] [address] [place], [place] [address]    Report Status PENDING  Incomplete  Blood Culture (routine x 2)     Status: None (Preliminary result)   Collection Time: 03/12/2019 10:26 PM   Specimen: BLOOD  Result Value Ref Range Status   Specimen Description   Final    BLOOD BLOOD LEFT HAND Performed at [facility] [address], [place], [place] [address]    Special Requests   Final    BOTTLES DRAWN AEROBIC AND ANAEROBIC Blood Culture adequate volume Performed at [facility] [address], [place], [place] [address]    Culture  Setup Time   Final    ANAEROBIC  BOTTLE ONLY GRAM POSITIVE COCCI CRITICAL RESULT CALLED TO, READ BACK BY AND VERIFIED WITH: B GREEN PHARMD 03/17/19 [id number]    Culture   Final    NO GROWTH 1 DAY Performed at [facility], [place] [address], [place], [place] [address]    Report Status PENDING  Incomplete  Respiratory Panel by RT PCR (Flu A&B, Covid) - Nasopharyngeal Swab     Status: None   Collection Time: 02/26/2019 11:01 PM   Specimen: Nasopharyngeal Swab  Result Value Ref Range Status   SARS Coronavirus 2 by RT PCR NEGATIVE NEGATIVE Final    Comment: (NOTE) SARS-CoV-2 target nucleic acids are NOT DETECTED. The SARS-CoV-2 RNA is generally detectable in upper respiratoy specimens during the acute phase of infection. The lowest concentration of SARS-CoV-2 viral copies this assay can detect is 131 copies/mL. A negative result does not preclude SARS-Cov-2 infection and should not be used as the sole basis for treatment or other patient management decisions. A negative result may occur with  improper specimen collection/handling, submission of specimen other than nasopharyngeal swab, presence of viral  mutation(s) within the areas targeted by this assay, and inadequate number of viral copies (<131 copies/mL). A negative result must be combined with clinical observations, patient history, and epidemiological information. The expected result is Negative. Fact Sheet for Patients:  PinkCheek.be Fact Sheet for Healthcare Providers:  GravelBags.it This test is not yet ap proved or cleared by the [place] FDA and  has been authorized for detection and/or diagnosis of SARS-CoV-2 by FDA under an Emergency Use Authorization (EUA). This EUA will remain  in effect (meaning this test can be used) for the duration of the COVID-19 declaration under Section 564(b)(1) of the Act, 21 U.S.C. section 360bbb-3(b)(1), unless the authorization is terminated or revoked sooner.    Influenza A by PCR NEGATIVE NEGATIVE Final   Influenza B by PCR NEGATIVE NEGATIVE Final    Comment: (NOTE) The Xpert Xpress SARS-CoV-2/FLU/RSV assay is intended as an aid in  the diagnosis of influenza from Nasopharyngeal swab specimens and  should not be used as a sole basis for treatment. Nasal washings and  aspirates are unacceptable for Xpert Xpress SARS-CoV-2/FLU/RSV  testing. Fact Sheet for Patients: PinkCheek.be Fact Sheet for Healthcare Providers: GravelBags.it This test is not yet approved or cleared by the [place] FDA and  has been authorized for detection and/or diagnosis of SARS-CoV-2 by  FDA under an Emergency Use Authorization (EUA). This EUA will remain  in effect (meaning this test can be used) for the duration of the  Covid-19 declaration under Section 564(b)(1) of the Act, 21  U.S.C. section 360bbb-3(b)(1), unless the authorization is  terminated or revoked. Performed at [facility], [place] [address], [place], [place] [address]   Urine culture     Status: Abnormal  (Preliminary result)   Collection Time: 03/16/19  2:22 AM   Specimen: In/Out Cath Urine  Result Value Ref Range Status   Specimen Description   Final    IN/OUT CATH URINE Performed at [place] [address] [place], [place] [address]    Special Requests   Final    NONE Performed at [facility], [place] [address] [place], [place] [address]    Culture (A)  Final    2,000 COLONIES/mL GRAM POSITIVE COCCI IDENTIFICATION AND SUSCEPTIBILITIES TO FOLLOW Performed at [facility], [place] [address] [place], [place] [address]    Report Status PENDING  Incomplete  MRSA PCR Screening     Status: Abnormal   Collection Time: 03/16/19  4:53 PM   Specimen: Nasopharyngeal  Result Value Ref Range Status   MRSA by PCR POSITIVE (A) NEGATIVE Final    Comment:        The GeneXpert MRSA Assay (FDA approved for NASAL specimens only), is one component of a comprehensive MRSA colonization surveillance program. It is not intended to diagnose MRSA infection nor to guide or monitor treatment for MRSA infections. RESULT CALLED TO, READ BACK BY AND VERIFIED WITH: GARLAND,G @ 2020 ON HX:[id number] BY POTEAT,S Performed at [facility], [place] [address] [place], [place] [address]   Culture, blood (routine x 2) Call MD if unable to obtain prior to antibiotics being given     Status: None (Preliminary result)   Collection Time: 03/16/19  5:59 PM   Specimen: BLOOD RIGHT ARM  Result Value Ref Range Status   Specimen Description   Final    BLOOD RIGHT ARM Performed at [facility] [address], [place], [place] [address]    Special Requests   Final    BOTTLES DRAWN AEROBIC ONLY Blood Culture adequate volume Performed at [facility] [address] [place], [place] [address]    Culture   Final    NO GROWTH < 12 HOURS Performed at [facility] [address] [place], [place] [address]    Report  Status PENDING  Incomplete  Culture, blood (routine x 2) Call MD if unable to obtain prior to antibiotics being given     Status: None (Preliminary result)   Collection Time: 03/16/19  5:59 PM   Specimen: BLOOD  Result Value Ref Range Status   Specimen Description   Final    BLOOD RIGHT ANTECUBITAL Performed at [facility] [facility] [address] [place], [place] [address]    Special Requests   Final    BOTTLES DRAWN AEROBIC AND ANAEROBIC Blood Culture adequate volume Performed at [facility] [address], [place], [place] [address]    Culture   Final    NO GROWTH < 12 HOURS Performed at [facility] [address], [place], [place] [address]    Report Status PENDING  Incomplete      Studies: No results found.  Scheduled Meds: . atorvastatin  20 mg Oral q1800  . azithromycin  500 mg Oral Daily  . Chlorhexidine Gluconate Cloth  6 each Topical Daily  . mouth rinse  15 mL Mouth Rinse BID  . mupirocin ointment  1 application Nasal BID  . pantoprazole  20 mg Oral Daily  . propafenone  225 mg Oral Q6H  . tamsulosin  0.4 mg Oral QHS    Continuous Infusions: . cefTRIAXone (ROCEPHIN)  IV Stopped (03/17/19 0021)  . diltiazem (CARDIZEM) infusion 15 mg/hr (03/17/19 1030)  . heparin 1,300 Units/hr (03/17/19 0500)     LOS: 1 day     Alma Friendly, MD [facility]  If 7PM-7AM, please contact night-coverage [email or web address] 03/17/2019, 11:02 AM

## 2019-03-17 NOTE — Progress Notes (Signed)
Progress Note  Patient Name: Stanley Hunter Date of Encounter: 03/17/2019  Primary Cardiologist: No primary care provider on file. Dr. Georg Ruddle ([facility])   Subjective   No chest pain, mildly confused -wants a wheelchair that fits in his narrow bathroom at home.  explainednot yet ready to go home.    Inpatient Medications    Scheduled Meds: . atorvastatin  20 mg Oral q1800  . azithromycin  500 mg Oral Daily  . Chlorhexidine Gluconate Cloth  6 each Topical Daily  . mouth rinse  15 mL Mouth Rinse BID  . mupirocin ointment  1 application Nasal BID  . pantoprazole  20 mg Oral Daily  . propafenone  225 mg Oral Q6H  . tamsulosin  0.4 mg Oral QHS   Continuous Infusions: . cefTRIAXone (ROCEPHIN)  IV Stopped (03/17/19 0021)  . diltiazem (CARDIZEM) infusion 15 mg/hr (03/17/19 1030)  . heparin 1,300 Units/hr (03/17/19 0500)   PRN Meds: ALPRAZolam   Vital Signs    Vitals:   03/17/19 0357 03/17/19 0400 03/17/19 0500 03/17/19 0800  BP:  (!) 139/102 113/78   Pulse:  (!) 112 (!) 111   Resp:  (!) 25 (!) 29   Temp: 98.2 F (36.8 C)   97.6 F (36.4 C)  TempSrc: Oral   Oral  SpO2:  95% 96%   Weight:      Height:        Intake/Output Summary (Last 24 hours) at 03/17/2019 1042 Last data filed at 03/17/2019 0600 Gross per 24 hour  Intake 756.76 ml  Output 350 ml  Net 406.76 ml   Last 3 Weights 03/16/2019 03/16/2019 01/08/2019  Weight (lbs) 163 lb 5.8 oz 167 lb 167 lb 3.2 oz  Weight (kg) 74.1 kg 75.751 kg 75.841 kg      Telemetry    A fib rate 120s - Personally Reviewed  ECG    A fib rate 109, ST depression II, AVF, Lat leads though similar to EKG in past - Personally Reviewed  Physical Exam   GEN: No acute distress.   Neck: No JVD sitting up in chair Cardiac: irreg irreg no murmurs, rubs, or gallops.  Respiratory: rhonchi throughout to auscultation bilaterally. GI: Soft, nontender, non-distended  MS: No edema; No deformity. Neuro:  Nonfocal  Psych: Normal affect    Labs    High Sensitivity Troponin:   Recent Labs  Lab 03/27/2019 2221 03/16/19 0222  TROPONINIHS 22* 17      Chemistry Recent Labs  Lab 03/04/2019 2221 03/16/19 1211 03/17/19 0129  NA 138 140 139  K 4.3 3.9 3.9  CL 106 107 109  CO2 21* 22 19*  GLUCOSE 124* 118* 113*  BUN 36* 28* 26*  CREATININE 2.41* 1.60* 1.33*  CALCIUM 8.3* 7.8* 8.0*  PROT 6.4*  --   --   ALBUMIN 2.8*  --   --   AST 21  --   --   ALT 17  --   --   ALKPHOS 78  --   --   BILITOT 1.0  --   --   GFRNONAA 22* 37* 46*  GFRAA 26* 43* 53*  ANIONGAP 11 11 11      Hematology Recent Labs  Lab 03/14/2019 2221 03/16/19 1115 03/17/19 0129  WBC 11.9* 12.2* 10.5  RBC 3.65* 3.71* 3.70*  HGB 11.4* 11.6* 11.3*  HCT 35.1* 35.6* 35.6*  MCV 96.2 96.0 96.2  MCH 31.2 31.3 30.5  MCHC 32.5 32.6 31.7  RDW 15.2 15.1 14.9  PLT 181  184 199    BNP Recent Labs  Lab 03/16/19 1157  BNP 434.8*     DDimer  Recent Labs  Lab 03/26/2019 2221  DDIMER 2.22*     Radiology    DG Chest Port 1 View  Result Date: 03/26/2019 CLINICAL DATA:  Fever and shortness of breath. EXAM: PORTABLE CHEST 1 VIEW COMPARISON:  In April 21, 2016 FINDINGS: There is a dual lead AICD. Mild infiltrate is seen along the medial aspect of the right lung base. There is elevation of the right hemidiaphragm. No pleural effusion or pneumothorax is identified. The cardiac silhouette is moderately enlarged. Marked severity calcification of the aortic arch is seen. Multilevel degenerative changes are noted throughout the thoracic spine. IMPRESSION: 1. Mild right basilar infiltrate. 2. Cardiomegaly. Electronically Signed   By: Virgina Norfolk M.D.   On: 03/01/2019 22:46   VAS [place] LOWER EXTREMITY VENOUS (DVT)  Result Date: 03/16/2019  Lower Venous Study Indications: Edema.  Risk Factors: None identified. Limitations: Patient positioning, patient movement. Comparison Study: No prior studies. Performing Technologist: Oliver Hum RVT  Examination  Guidelines: A complete evaluation includes B-mode imaging, spectral Doppler, color Doppler, and power Doppler as needed of all accessible portions of each vessel. Bilateral testing is considered an integral part of a complete examination. Limited examinations for reoccurring indications may be performed as noted.  +---------+---------------+---------+-----------+----------+--------------+ RIGHT    CompressibilityPhasicitySpontaneityPropertiesThrombus Aging +---------+---------------+---------+-----------+----------+--------------+ CFV      Full           Yes      Yes                                 +---------+---------------+---------+-----------+----------+--------------+ SFJ      Full                                                        +---------+---------------+---------+-----------+----------+--------------+ FV Prox  Full                                                        +---------+---------------+---------+-----------+----------+--------------+ FV Mid   Full                                                        +---------+---------------+---------+-----------+----------+--------------+ FV DistalFull                                                        +---------+---------------+---------+-----------+----------+--------------+ PFV      Full                                                        +---------+---------------+---------+-----------+----------+--------------+  POP      Full           Yes      Yes                                 +---------+---------------+---------+-----------+----------+--------------+ PTV      Full                                                        +---------+---------------+---------+-----------+----------+--------------+ PERO     Full                                                        +---------+---------------+---------+-----------+----------+--------------+    +---------+---------------+---------+-----------+----------+--------------+ LEFT     CompressibilityPhasicitySpontaneityPropertiesThrombus Aging +---------+---------------+---------+-----------+----------+--------------+ CFV      Full           Yes      Yes                                 +---------+---------------+---------+-----------+----------+--------------+ SFJ      Full                                                        +---------+---------------+---------+-----------+----------+--------------+ FV Prox  Full                                                        +---------+---------------+---------+-----------+----------+--------------+ FV Mid   Full                                                        +---------+---------------+---------+-----------+----------+--------------+ FV DistalFull                                                        +---------+---------------+---------+-----------+----------+--------------+ PFV      Full                                                        +---------+---------------+---------+-----------+----------+--------------+ POP      Full           Yes      Yes                                 +---------+---------------+---------+-----------+----------+--------------+  PTV      Full                                                        +---------+---------------+---------+-----------+----------+--------------+ PERO     Full                                                        +---------+---------------+---------+-----------+----------+--------------+     Summary: Right: There is no evidence of deep vein thrombosis in the lower extremity. No cystic structure found in the popliteal fossa. Left: There is no evidence of deep vein thrombosis in the lower extremity. No cystic structure found in the popliteal fossa.  *See table(s) above for measurements and observations. Electronically signed by  Deitra Mayo MD on 03/16/2019 at 12:51:29 PM.    Final     Cardiac Studies   Echo pending.  Patient Profile     86 y.o. male with a hx of atrial flutter s/p ablation, sick sinus syndrome status post [employer] pacemaker, symptomatic PVC on Rythmol, hypertension, chronic kidney disease stage III and hyperlipidemia now admitted with hypoxia and PNA.   Pt in atrial fib.    Assessment & Plan    1.  Atrial fibrillation with rapid ventricular rate -He has history of a flutter status post ablation and symptomatic PVC on propafenone - He is not on anticoagulation chronically on heparin now -Heart rate in 140 he is on maximum dose of IV Cardizem - CHADSVASCs score of 3 for age and HTN - Resumed Propafenone 225 every 6 hours -Pending echocardiogram  2.  Pneumonia -Covid and influenza panel negative -Per primary team  3.  Acute on chronic kidney disease -Follow with daily BMET  -Cr 2.41 >>1.60 >>1.33  4. HTN - BP stable. On IV cardizem. Home regimen on hold  5. SSS s/p Pacemaker      For questions or updates, please contact [place] Please consult [email or web address] for contact info under        Signed, Cecilie Kicks, NP  03/17/2019, 10:42 AM

## 2019-03-17 NOTE — Progress Notes (Signed)
Remains in afib.  Oxygen requirement increased this shift from 6L [place] to 10L high Flow. sats 88-92.  Looking up at ceiling talking.  Will update MD.

## 2019-03-17 NOTE — Progress Notes (Signed)
Pt refused NM scan.  States he knows they want to look a lungs but he doesn't want it done.

## 2019-03-17 NOTE — Progress Notes (Signed)
RN page NP regarding increasing agitation and combativeness, interfering with medical care.

## 2019-03-17 NOTE — Progress Notes (Addendum)
ANTICOAGULATION CONSULT NOTE - Follow up  Pharmacy Consult for heparin  Indication: atrial fibrillation  Allergies  Allergen Reactions  . Cortisone Other (See Comments)    Unknown reaction  . Neomycin Rash  . Sulfamethoxazole-Trimethoprim Other (See Comments)    Leg weakness, arrhythmia    Patient Measurements: Height: 5\' 9"  (175.3 cm) Weight: 163 lb 5.8 oz (74.1 kg) IBW/kg (Calculated) : 70.7 Heparin Dosing Weight: 76 kg  Vital Signs: Temp: 97.6 F (36.4 C) (01/20 0800) Temp Source: Oral (01/20 0800) BP: 113/78 (01/20 0500) Pulse Rate: 111 (01/20 0500)  Labs: Recent Labs    03/03/2019 2221 03/28/2019 2221 03/16/19 0222 03/16/19 1115 03/16/19 1157 03/16/19 1211 03/16/19 1759 03/16/19 2304 03/17/19 0129 03/17/19 0853  HGB 11.4*   < >  --  11.6*  --   --   --   --  11.3*  --   HCT 35.1*  --   --  35.6*  --   --   --   --  35.6*  --   PLT 181  --   --  184  --   --   --   --  199  --   APTT 50*  --   --   --   --   --   --   --   --   --   LABPROT 16.8*  --   --   --   --   --   --   --   --   --   INR 1.4*  --   --   --   --   --   --   --   --   --   HEPARINUNFRC  --   --   --   --    < >  --  0.16* 0.14*  --  0.17*  CREATININE 2.41*  --   --   --   --  1.60*  --   --  1.33*  --   TROPONINIHS 22*  --  17  --   --   --   --   --   --   --    < > = values in this interval not displayed.    Estimated Creatinine Clearance: 35.4 mL/min (A) (by C-G formula based on SCr of 1.33 mg/dL (H)).   Assessment: 86 yo M presents with SOB and fever. Concern for VTE to started on heparin gtt. Dopplers negative, VQ/CT chest has not been ordered yet. Ddimer slightly elevated at 2.2. Also in Afib with RVR. Has a hx of Afib but not on anticoag PTA. 03/17/2019  Heparin level drawn at 0853 remains subtherapeutic at 0.17.  Heparin rate was increased from 1150 to 1300 units/hr at 0013 AM.  RN reports that IV came out some time during the night but is infusing well now.  Uncertain when or  how long it was off.  CBC stable, no bleeding reported.   Goal of Therapy:  Heparin level 0.3-0.7 units/ml Monitor platelets by anticoagulation protocol: Yes   Plan:  Bolus heparin 2000 units IV x 1 continue heparin drip at 1300 units/hr Recheck HL 8 hours after bolus Monitor daily heparin level CBC, s/s of bleed F/u Cards recs, need for VQ scan/CT chest to r/o PE Likely transition to Eliquis once renal fxn stabilizes IV azithromycin > PO per protocol  Eudelia Bunch, Pharm.D [phone] 03/17/2019 9:54 AM

## 2019-03-17 NOTE — Progress Notes (Signed)
Pt constantly trying to get oob.  Assisted to chair which was very difficult for pt.  He is weak and c/o pain to left leg.  In chair now with wife at bedside.

## 2019-03-17 NOTE — Progress Notes (Addendum)
ANTICOAGULATION CONSULT NOTE - Follow up  Pharmacy Consult for heparin >>Eliquis Indication: atrial fibrillation  Allergies  Allergen Reactions  . Cortisone Other (See Comments)    Unknown reaction  . Neomycin Rash  . Sulfamethoxazole-Trimethoprim Other (See Comments)    Leg weakness, arrhythmia    Patient Measurements: Height: 5\' 9"  (175.3 cm) Weight: 163 lb 5.8 oz (74.1 kg) IBW/kg (Calculated) : 70.7 Heparin Dosing Weight: 76 kg  Vital Signs: Temp: 97.7 F (36.5 C) (01/20 1200) Temp Source: Oral (01/20 1200) BP: 141/74 (01/20 1300) Pulse Rate: 110 (01/20 1300)  Labs: Recent Labs    02/26/2019 2221 03/20/2019 2221 03/16/19 0222 03/16/19 1115 03/16/19 1157 03/16/19 1211 03/16/19 1759 03/16/19 2304 03/17/19 0129 03/17/19 0853  HGB 11.4*   < >  --  11.6*  --   --   --   --  11.3*  --   HCT 35.1*  --   --  35.6*  --   --   --   --  35.6*  --   PLT 181  --   --  184  --   --   --   --  199  --   APTT 50*  --   --   --   --   --   --   --   --   --   LABPROT 16.8*  --   --   --   --   --   --   --   --   --   INR 1.4*  --   --   --   --   --   --   --   --   --   HEPARINUNFRC  --   --   --   --    < >  --  0.16* 0.14*  --  0.17*  CREATININE 2.41*  --   --   --   --  1.60*  --   --  1.33*  --   TROPONINIHS 22*  --  17  --   --   --   --   --   --   --    < > = values in this interval not displayed.    Estimated Creatinine Clearance: 35.4 mL/min (A) (by C-G formula based on SCr of 1.33 mg/dL (H)).   Assessment: 86 yo M presents with SOB and fever. Concern for VTE to started on heparin gtt. Dopplers negative, VQ/CT chest has not been ordered yet. Ddimer slightly elevated at 2.2. Also in Afib with RVR. Has a hx of Afib but not on anticoag PTA. 03/17/2019  To transition from UFH to Eliquis.  Age > 80, wt > 60, SCr < 1.5. Qualifies for 5 mg bid dose for afib.   For VQ scan today to r/o PE.  Goal of Therapy:  Heparin level 0.3-0.7 units/ml Monitor platelets by  anticoagulation protocol: Yes   Plan:  Dc heparin drip Eliquis 5 mg po bid F/u VQ scan results: if + for PE will need to adjust dose Will educate and give 30 day free card tomorrow when wife available DC CBC/heparin levels  Eudelia Bunch, Pharm.D [phone] 03/17/2019 1:43 PM

## 2019-03-17 NOTE — Progress Notes (Signed)
F/U visit was requested by spiritual care community for the Pt. wife.  The Pt. wife was not present at the time of the chaplain visit.  The chaplain will F/U tomorrow.

## 2019-03-17 NOTE — Discharge Instructions (Signed)

## 2019-03-17 NOTE — Progress Notes (Signed)
PHARMACY - PHYSICIAN COMMUNICATION CRITICAL VALUE ALERT - BLOOD CULTURE IDENTIFICATION (BCID)  Stanley Hunter is an 86 y.o. male who presented to [facility] on 03/11/2019 with a chief complaint of a-fib/CAP  Assessment:  CAP? (include suspected source if known) 1 of 7 bottles GPC- likely contaminant.  Name of physician (or Provider) Contacted: Ardith Dark, NP  Current antibiotics: roc/zmax  Changes to prescribed antibiotics recommended:  Patient is on recommended antibiotics - No changes needed  No results found for this or any previous visit.  Dorrene German 03/17/2019  1:37 AM

## 2019-03-17 NOTE — Progress Notes (Signed)
ANTICOAGULATION CONSULT NOTE - Follow up  Pharmacy Consult for heparin  Indication: atrial fibrillation  Allergies  Allergen Reactions  . Cortisone Other (See Comments)    Unknown reaction  . Neomycin Rash  . Sulfamethoxazole-Trimethoprim Other (See Comments)    Leg weakness, arrhythmia    Patient Measurements: Height: 5\' 9"  (175.3 cm) Weight: 163 lb 5.8 oz (74.1 kg) IBW/kg (Calculated) : 70.7 Heparin Dosing Weight: 76 kg  Vital Signs: Temp: 98.2 F (36.8 C) (01/19 2333) Temp Source: Oral (01/19 2333) BP: 162/99 (01/19 2000) Pulse Rate: 135 (01/19 2000)  Labs: Recent Labs    03/28/2019 2221 03/16/19 0222 03/16/19 1115 03/16/19 1157 03/16/19 1211 03/16/19 1759 03/16/19 2304  HGB 11.4*  --  11.6*  --   --   --   --   HCT 35.1*  --  35.6*  --   --   --   --   PLT 181  --  184  --   --   --   --   APTT 50*  --   --   --   --   --   --   LABPROT 16.8*  --   --   --   --   --   --   INR 1.4*  --   --   --   --   --   --   HEPARINUNFRC  --   --   --  0.67  --  0.16* 0.14*  CREATININE 2.41*  --   --   --  1.60*  --   --   TROPONINIHS 22* 17  --   --   --   --   --     Estimated Creatinine Clearance: 29.5 mL/min (A) (by C-G formula based on SCr of 1.6 mg/dL (H)).   Medical History: Past Medical History:  Diagnosis Date  . Acid reflux 08/03/2015  . Anxiety 08/03/2015  . Avitaminosis D 05/18/2012  . Benign essential HTN 02/09/2009   Overview:  Benign Essential Hypertension   . Benign prostatic hyperplasia with urinary obstruction 12/07/2013  . Chronic kidney disease (CKD), stage III (moderate) 11/04/2011  . CN (constipation) 08/03/2015  . Cognitive changes 08/07/2015  . Degenerative arthritis of hip 05/14/2013  . Degenerative arthritis of spine 05/14/2013  . Degenerative disorder of muscle 08/03/2015  . Dysrhythmia 2001   ablation for a-flutter  . H/O atrial flutter 08/03/2015   Overview:  Pacemaker placed 2015   . High blood pressure   . High cholesterol   . History of  pacemaker 2016  . Hx of cardiac cath 2013  . Loss of balance 08/07/2015  . Lumbar canal stenosis 07/07/2013   Overview:  Lumbar laminectomy 07/07/13 - Dr. Lurene Shadow   . Lumbar scoliosis 05/14/2013  . Macular degeneration 08/07/2015  . Premature contractions, supraventricular 12/15/2008   Overview:  Atrial Premature Complex   . Presence of permanent cardiac pacemaker   . Tremor 08/07/2015   hands  . Urinary frequency 08/07/2015   Assessment: 86 yo M presents with SOB and fever. Concern for VTE to started on heparin gtt. Dopplers negative, VQ/CT chest has not been ordered yet. Ddimer slightly elevated at 2.2. Also in Afib with RVR. Has a hx of Afib but not on anticoag PTA. First heparin level therapeutic at 0.67. Hgb 11.6, plts wnl. 2304 HL = 0.14 below goal, no infusion or bleeding issues per RN.   Goal of Therapy:  Heparin level 0.3-0.7 units/ml Monitor platelets by anticoagulation  protocol: Yes   Plan:  Increase heparin drip to 1300 units/hr Recheck HL in 8 hours Monitor daily heparin level CBC, s/s of bleed F/u Cards recs, need for VQ scan/CT chest to r/o PE  Dorrene German 03/17/2019,12:13 AM

## 2019-03-17 NOTE — Progress Notes (Signed)
  Echocardiogram 2D Echocardiogram has been performed.  Jannett Celestine 03/17/2019, 2:13 PM

## 2019-03-17 NOTE — Progress Notes (Signed)
This chaplain introduced herself to the Pt. after meeting the Pt. wife in the hall earlier.  The Pt. wife was appreciative of the visit. The chaplain affirmed the wife's presence with her husband and learned both the Pt. and wife live in a supportive community with many friends.  The chaplain offered F/U spiritual care as needed.

## 2019-03-18 DIAGNOSIS — J189 Pneumonia, unspecified organism: Secondary | ICD-10-CM

## 2019-03-18 LAB — BASIC METABOLIC PANEL
Anion gap: 11 (ref 5–15)
BUN: 28 mg/dL — ABNORMAL HIGH (ref 8–23)
CO2: 20 mmol/L — ABNORMAL LOW (ref 22–32)
Calcium: 8.3 mg/dL — ABNORMAL LOW (ref 8.9–10.3)
Chloride: 109 mmol/L (ref 98–111)
Creatinine, Ser: 1.32 mg/dL — ABNORMAL HIGH (ref 0.61–1.24)
GFR calc Af Amer: 54 mL/min — ABNORMAL LOW (ref >60)
GFR calc non Af Amer: 47 mL/min — ABNORMAL LOW (ref >60)
Glucose, Bld: 134 mg/dL — ABNORMAL HIGH (ref 70–99)
Potassium: 3.9 mmol/L (ref 3.5–5.1)
Sodium: 140 mmol/L (ref 135–145)

## 2019-03-18 LAB — SARS CORONAVIRUS 2 (TAT 6-24 HRS): SARS Coronavirus 2: NEGATIVE

## 2019-03-18 MED ORDER — DM-GUAIFENESIN ER 30-600 MG PO TB12
1.0000 | ORAL_TABLET | Freq: Two times a day (BID) | ORAL | Status: DC
  Administered 2019-03-18 – 2019-03-21 (×6): 1 via ORAL
  Filled 2019-03-18 (×6): qty 1

## 2019-03-18 MED ORDER — SODIUM CHLORIDE 0.9 % IV SOLN
3.0000 g | Freq: Three times a day (TID) | INTRAVENOUS | Status: DC
  Administered 2019-03-18 – 2019-03-20 (×7): 3 g via INTRAVENOUS
  Filled 2019-03-18 (×3): qty 8
  Filled 2019-03-18: qty 3
  Filled 2019-03-18 (×3): qty 8
  Filled 2019-03-18: qty 3

## 2019-03-18 MED ORDER — VANCOMYCIN HCL 1750 MG/350ML IV SOLN
1750.0000 mg | Freq: Once | INTRAVENOUS | Status: AC
  Administered 2019-03-18: 1750 mg via INTRAVENOUS
  Filled 2019-03-18: qty 350

## 2019-03-18 MED ORDER — VANCOMYCIN HCL 1500 MG/300ML IV SOLN
1500.0000 mg | INTRAVENOUS | Status: DC
  Administered 2019-03-20: 1500 mg via INTRAVENOUS
  Filled 2019-03-18: qty 300

## 2019-03-18 MED ORDER — LEVALBUTEROL HCL 0.63 MG/3ML IN NEBU
0.6300 mg | INHALATION_SOLUTION | Freq: Four times a day (QID) | RESPIRATORY_TRACT | Status: DC
  Administered 2019-03-18 – 2019-03-22 (×14): 0.63 mg via RESPIRATORY_TRACT
  Filled 2019-03-18 (×15): qty 3

## 2019-03-18 NOTE — Progress Notes (Addendum)
PROGRESS NOTE  Stanley Hunter [id number] DOB: 1924/03/15 DOA: 03/16/2019 PCP: Mast, Man X, NP  HPI/Recap of past 24 hours: HPI from Dr Ladona Ridgel is a 86 y.o. male with medical history significant of coronary artery disease status post. Cardiac catheterization, chronic kidney disease stage III, essential hypertension, degenerative disc joint disease, history of pacemaker placement 2015, who had his Covid vaccine 2 weeks ago and presents with shortness of breath, cough and fever. Patient also has lower extremity edema. He was found to be hypoxic. Chest x-ray showed lobar pneumonia. In the ED, patient noted to be hypoxic, Covid test negative.  Patient admitted for further management    Today, patient still remains confused, continues to require more oxygen, currently on about 13L, denies any new complaints, although currently confused.     Assessment/Plan: Principal Problem:   CAP (community acquired pneumonia) Active Problems:   Pure hypercholesterolemia   History of pacemaker   Hypertensive heart and renal disease   Anemia of chronic disease   Hyperlipidemia   Stage 3b chronic kidney disease   Persistent atrial fibrillation (HCC)   Acute hypoxic respiratory failure likely 2/2 ?CAP, r/o PE Currently still requiring more O2, currently at about 13 L Afebrile, with no leukocytosis Urine strep pneumo negative COVID-19, influenza panel negative Chest x-ray with right basilar infiltrate Unable to do CTA due to CKD, will order V/Q scan, pt currently refusing SLP pending S/P ceftriaxone, switched to IV Unasyn, continue azithromycin, will add vancomycin (MRSA PCR +) DuoNeb, Supplemental O2 as needed Monitor closely  A. fib with RVR HR with better control Had previous flutter ablation, pacemaker implantation secondary to SSS Venous Dopplers lower extremity negative for DVT Cardiology on board, hold off on cardioversion Continue Cardizem drip, propafenone Start  Eliquis  ??UTI Urine culture grew Staphylococcus warneri, 2000 colonies Start vancomycin as above  ?CHF Elevated BNP, 434.8 Echo with EF of 65 to 70%, no regional wall motion abnormality  AKI on CKD stage IIIb Improving Baseline creatinine 1.9 Daily BMP  Hypertension Hold home Norvasc Continue diltiazem drip  Hyperlipidemia Continue statins  GERD Continue PPI  BPH Continue Flomax  GOC Poor prognosis, palliative consulted         Malnutrition Type:      Malnutrition Characteristics:      Nutrition Interventions:       Estimated body mass index is 24.12 kg/m as calculated from the following:   Height as of this encounter: 5\' 9"  (1.753 m).   Weight as of this encounter: 74.1 kg.     Code Status: DNR  Family Communication: Spoke to wife on 03/17/2019  Disposition Plan: May require SNF pending clinical improvement   Consultants:  Cardiology  Procedures:  None  Antimicrobials:  Azithromycin  Unasyn  Vancomycin  DVT prophylaxis: Eliquis   Objective: Vitals:   03/18/19 1200 03/18/19 1300 03/18/19 1340 03/18/19 1354  BP: 117/65 129/81    Pulse: 92 76 (!) 101   Resp: (!) 24 (!) 31 (!) 34   Temp:      TempSrc:      SpO2: 92% 91% 92% 91%  Weight:      Height:        Intake/Output Summary (Last 24 hours) at 03/18/2019 1424 Last data filed at 03/18/2019 1200 Gross per 24 hour  Intake 787.73 ml  Output 250 ml  Net 537.73 ml   Filed Weights   03/16/19 0222 03/16/19 1653  Weight: 75.8 kg 74.1 kg    Exam:  General:  NAD, lethargic, confused, but alert  Cardiovascular: S1, S2 present  Respiratory:  Rhonchi noted throughout bilaterally  Abdomen: Soft, nontender, nondistended, bowel sounds present  Musculoskeletal: No bilateral pedal edema noted  Skin: Normal  Psychiatry:  Unable to assess    Data Reviewed: CBC: Recent Labs  Lab 03/04/2019 2221 03/16/19 1115 03/17/19 0129  WBC 11.9* 12.2* 10.5  NEUTROABS 8.1*   --   --   HGB 11.4* 11.6* 11.3*  HCT 35.1* 35.6* 35.6*  MCV 96.2 96.0 96.2  PLT 181 184 [id number]   Basic Metabolic Panel: Recent Labs  Lab 03/14/2019 2221 03/16/19 1211 03/17/19 0129 03/18/19 0127  NA 138 140 139 140  K 4.3 3.9 3.9 3.9  CL 106 107 109 109  CO2 21* 22 19* 20*  GLUCOSE 124* 118* 113* 134*  BUN 36* 28* 26* 28*  CREATININE 2.41* 1.60* 1.33* 1.32*  CALCIUM 8.3* 7.8* 8.0* 8.3*  MG  --   --  2.0  --    GFR: Estimated Creatinine Clearance: 35.7 mL/min (A) (by C-G formula based on SCr of 1.32 mg/dL (H)). Liver Function Tests: Recent Labs  Lab 03/06/2019 2221  AST 21  ALT 17  ALKPHOS 78  BILITOT 1.0  PROT 6.4*  ALBUMIN 2.8*   No results for input(s): LIPASE, AMYLASE in the last 168 hours. No results for input(s): AMMONIA in the last 168 hours. Coagulation Profile: Recent Labs  Lab 03/25/2019 2221  INR 1.4*   Cardiac Enzymes: No results for input(s): CKTOTAL, CKMB, CKMBINDEX, TROPONINI in the last 168 hours. BNP (last 3 results) No results for input(s): PROBNP in the last 8760 hours. HbA1C: No results for input(s): HGBA1C in the last 72 hours. CBG: No results for input(s): GLUCAP in the last 168 hours. Lipid Profile: No results for input(s): CHOL, HDL, LDLCALC, TRIG, CHOLHDL, LDLDIRECT in the last 72 hours. Thyroid Function Tests: No results for input(s): TSH, T4TOTAL, FREET4, T3FREE, THYROIDAB in the last 72 hours. Anemia Panel: No results for input(s): VITAMINB12, FOLATE, FERRITIN, TIBC, IRON, RETICCTPCT in the last 72 hours. Urine analysis:    Component Value Date/Time   COLORURINE YELLOW 03/16/2019 0222   APPEARANCEUR HAZY (A) 03/16/2019 0222   LABSPEC 1.024 03/16/2019 0222   PHURINE 5.0 03/16/2019 0222   GLUCOSEU NEGATIVE 03/16/2019 0222   HGBUR NEGATIVE 03/16/2019 0222   BILIRUBINUR NEGATIVE 03/16/2019 0222   KETONESUR 5 (A) 03/16/2019 0222   PROTEINUR 30 (A) 03/16/2019 0222   NITRITE NEGATIVE 03/16/2019 0222   LEUKOCYTESUR NEGATIVE 03/16/2019  0222   Sepsis Labs: @LABRCNTIP (procalcitonin:4,lacticidven:4)  ) Recent Results (from the past 240 hour(s))  Blood Culture (routine x 2)     Status: None (Preliminary result)   Collection Time: 03/14/2019 10:21 PM   Specimen: BLOOD  Result Value Ref Range Status   Specimen Description   Final    BLOOD BLOOD RIGHT ARM Performed at [facility], [place] [address], [place], [place] [address]    Special Requests   Final    BOTTLES DRAWN AEROBIC AND ANAEROBIC Blood Culture adequate volume Performed at [facility] [address] [place], [place] [address]    Culture   Final    NO GROWTH 2 DAYS Performed at [facility] [address], [place], [place] [address]    Report Status PENDING  Incomplete  Blood Culture (routine x 2)     Status: Abnormal (Preliminary result)   Collection Time: 03/03/2019 10:26 PM   Specimen: BLOOD  Result Value Ref  Range Status   Specimen Description   Final    BLOOD BLOOD LEFT HAND Performed at [facility] [address], [place], [place] [address]    Special Requests   Final    BOTTLES DRAWN AEROBIC AND ANAEROBIC Blood Culture adequate volume Performed at [facility] [address], [place], [place] [address]    Culture  Setup Time   Final    ANAEROBIC BOTTLE ONLY GRAM POSITIVE COCCI CRITICAL RESULT CALLED TO, READ BACK BY AND VERIFIED WITH: B GREEN PHARMD 03/17/19 0137 JDW    Culture (A)  Final    STAPHYLOCOCCUS SPECIES (COAGULASE NEGATIVE) THE SIGNIFICANCE OF ISOLATING THIS ORGANISM FROM A SINGLE SET OF BLOOD CULTURES WHEN MULTIPLE SETS ARE DRAWN IS UNCERTAIN. PLEASE NOTIFY THE MICROBIOLOGY DEPARTMENT WITHIN ONE WEEK IF SPECIATION AND SENSITIVITIES ARE REQUIRED. Performed at [facility], [place] [address] [place], [place] [address]    Report Status PENDING  Incomplete  Respiratory Panel by RT PCR (Flu A&B, Covid) - Nasopharyngeal Swab     Status: None    Collection Time: 03/17/2019 11:01 PM   Specimen: Nasopharyngeal Swab  Result Value Ref Range Status   SARS Coronavirus 2 by RT PCR NEGATIVE NEGATIVE Final    Comment: (NOTE) SARS-CoV-2 target nucleic acids are NOT DETECTED. The SARS-CoV-2 RNA is generally detectable in upper respiratoy specimens during the acute phase of infection. The lowest concentration of SARS-CoV-2 viral copies this assay can detect is 131 copies/mL. A negative result does not preclude SARS-Cov-2 infection and should not be used as the sole basis for treatment or other patient management decisions. A negative result may occur with  improper specimen collection/handling, submission of specimen other than nasopharyngeal swab, presence of viral mutation(s) within the areas targeted by this assay, and inadequate number of viral copies (<131 copies/mL). A negative result must be combined with clinical observations, patient history, and epidemiological information. The expected result is Negative. Fact Sheet for Patients:  PinkCheek.be Fact Sheet for Healthcare Providers:  GravelBags.it This test is not yet ap proved or cleared by the [place] FDA and  has been authorized for detection and/or diagnosis of SARS-CoV-2 by FDA under an Emergency Use Authorization (EUA). This EUA will remain  in effect (meaning this test can be used) for the duration of the COVID-19 declaration under Section 564(b)(1) of the Act, 21 U.S.C. section 360bbb-3(b)(1), unless the authorization is terminated or revoked sooner.    Influenza A by PCR NEGATIVE NEGATIVE Final   Influenza B by PCR NEGATIVE NEGATIVE Final    Comment: (NOTE) The Xpert Xpress SARS-CoV-2/FLU/RSV assay is intended as an aid in  the diagnosis of influenza from Nasopharyngeal swab specimens and  should not be used as a sole basis for treatment. Nasal washings and  aspirates are unacceptable for Xpert Xpress  SARS-CoV-2/FLU/RSV  testing. Fact Sheet for Patients: PinkCheek.be Fact Sheet for Healthcare Providers: GravelBags.it This test is not yet approved or cleared by the [place] FDA and  has been authorized for detection and/or diagnosis of SARS-CoV-2 by  FDA under an Emergency Use Authorization (EUA). This EUA will remain  in effect (meaning this test can be used) for the duration of the  Covid-19 declaration under Section 564(b)(1) of the Act, 21  U.S.C. section 360bbb-3(b)(1), unless the authorization is  terminated or revoked. Performed at [facility], [place] [address], [place], [place] [address]   Urine culture     Status: Abnormal   Collection Time:  03/16/19  2:22 AM   Specimen: In/Out Cath Urine  Result Value Ref Range Status   Specimen Description   Final    IN/OUT CATH URINE Performed at [facility], [place] [address], [place], [place] [address]    Special Requests   Final    NONE Performed at [facility], [place] [address], [place], [place] [address]    Culture 2,000 COLONIES/mL STAPHYLOCOCCUS WARNERI (A)  Final   Report Status 03/17/2019 FINAL  Final   Organism ID, Bacteria STAPHYLOCOCCUS WARNERI (A)  Final      Susceptibility   Staphylococcus warneri - MIC*    CIPROFLOXACIN <=0.5 SENSITIVE Sensitive     GENTAMICIN <=0.5 SENSITIVE Sensitive     NITROFURANTOIN 32 SENSITIVE Sensitive     OXACILLIN <=0.25 SENSITIVE Sensitive     TETRACYCLINE >=16 RESISTANT Resistant     VANCOMYCIN <=0.5 SENSITIVE Sensitive     TRIMETH/SULFA <=10 SENSITIVE Sensitive     CLINDAMYCIN RESISTANT Resistant     RIFAMPIN <=0.5 SENSITIVE Sensitive     Inducible Clindamycin POSITIVE Resistant     * 2,000 COLONIES/mL STAPHYLOCOCCUS WARNERI  MRSA PCR Screening     Status: Abnormal   Collection Time: 03/16/19  4:53 PM   Specimen: Nasopharyngeal  Result Value Ref Range Status   MRSA  by PCR POSITIVE (A) NEGATIVE Final    Comment:        The GeneXpert MRSA Assay (FDA approved for NASAL specimens only), is one component of a comprehensive MRSA colonization surveillance program. It is not intended to diagnose MRSA infection nor to guide or monitor treatment for MRSA infections. RESULT CALLED TO, READ BACK BY AND VERIFIED WITH: GARLAND,G @ 2020 ON HX:[id number] BY POTEAT,S Performed at [facility], [place] [address] [place], [place] [address]   Culture, blood (routine x 2) Call MD if unable to obtain prior to antibiotics being given     Status: None (Preliminary result)   Collection Time: 03/16/19  5:59 PM   Specimen: BLOOD RIGHT ARM  Result Value Ref Range Status   Specimen Description   Final    BLOOD RIGHT ARM Performed at [facility] [address] [place], [place] [address]    Special Requests   Final    BOTTLES DRAWN AEROBIC ONLY Blood Culture adequate volume Performed at [facility] [address], [place], [place] [address]    Culture   Final    NO GROWTH 2 DAYS Performed at [facility] [address], [place], [place] [address]    Report Status PENDING  Incomplete  Culture, blood (routine x 2) Call MD if unable to obtain prior to antibiotics being given     Status: None (Preliminary result)   Collection Time: 03/16/19  5:59 PM   Specimen: BLOOD  Result Value Ref Range Status   Specimen Description   Final    BLOOD RIGHT ANTECUBITAL Performed at [facility] [address] [place], [place] [address]    Special Requests   Final    BOTTLES DRAWN AEROBIC AND ANAEROBIC Blood Culture adequate volume Performed at [facility] [address] [place], [place] [address]    Culture   Final    NO GROWTH 2 DAYS Performed at [facility] [address], [place], [place] [address]    Report Status PENDING  Incomplete  SARS CORONAVIRUS 2 (TAT 6-24  HRS) Nasopharyngeal Nasopharyngeal Swab  Status: None   Collection Time: 03/17/19  6:30 PM   Specimen: Nasopharyngeal Swab  Result Value Ref Range Status   SARS Coronavirus 2 NEGATIVE NEGATIVE Final    Comment: (NOTE) SARS-CoV-2 target nucleic acids are NOT DETECTED. The SARS-CoV-2 RNA is generally detectable in upper and lower respiratory specimens during the acute phase of infection. Negative results do not preclude SARS-CoV-2 infection, do not rule out co-infections with other pathogens, and should not be used as the sole basis for treatment or other patient management decisions. Negative results must be combined with clinical observations, patient history, and epidemiological information. The expected result is Negative. Fact Sheet for Patients: SugarRoll.be Fact Sheet for Healthcare Providers: https://www.woods-mathews.com/ This test is not yet approved or cleared by the [place] FDA and  has been authorized for detection and/or diagnosis of SARS-CoV-2 by FDA under an Emergency Use Authorization (EUA). This EUA will remain  in effect (meaning this test can be used) for the duration of the COVID-19 declaration under Section 56 4(b)(1) of the Act, 21 U.S.C. section 360bbb-3(b)(1), unless the authorization is terminated or revoked sooner. Performed at [facility], [place] [address] [place], [place] [address]       Studies: No results found.  Scheduled Meds: . apixaban  5 mg Oral BID  . atorvastatin  20 mg Oral q1800  . azithromycin  500 mg Oral Daily  . Chlorhexidine Gluconate Cloth  6 each Topical Daily  . levalbuterol  0.63 mg Nebulization Q6H  . mouth rinse  15 mL Mouth Rinse BID  . mupirocin ointment  1 application Nasal BID  . pantoprazole  20 mg Oral Daily  . propafenone  225 mg Oral Q6H  . tamsulosin  0.4 mg Oral QHS    Continuous Infusions: . ampicillin-sulbactam (UNASYN) IV Stopped (03/18/19 0958)  .  diltiazem (CARDIZEM) infusion 15 mg/hr (03/18/19 1244)     LOS: 2 days     Alma Friendly, MD [facility]  If 7PM-7AM, please contact night-coverage [email or web address] 03/18/2019, 2:24 PM

## 2019-03-18 NOTE — Progress Notes (Signed)
Progress Note  Patient Name: Stanley Hunter Date of Encounter: 03/18/2019  Primary Cardiologist: No primary care provider on file. Dr. Georg Ruddle ([facility])   Subjective   Remains confused, combative at times. Creatinine has improved, switched to Eliquis from heparin yesterday. EKG today shows flutter with variable AV block.  Inpatient Medications    Scheduled Meds: . apixaban  5 mg Oral BID  . atorvastatin  20 mg Oral q1800  . azithromycin  500 mg Oral Daily  . Chlorhexidine Gluconate Cloth  6 each Topical Daily  . levalbuterol  0.63 mg Nebulization Q8H  . mouth rinse  15 mL Mouth Rinse BID  . mupirocin ointment  1 application Nasal BID  . pantoprazole  20 mg Oral Daily  . propafenone  225 mg Oral Q6H  . tamsulosin  0.4 mg Oral QHS   Continuous Infusions: . ampicillin-sulbactam (UNASYN) IV Stopped (03/18/19 0958)  . diltiazem (CARDIZEM) infusion 15 mg/hr (03/18/19 1000)   PRN Meds: ALPRAZolam, levalbuterol   Vital Signs    Vitals:   03/18/19 0818 03/18/19 0828 03/18/19 0900 03/18/19 1000  BP:   (!) 119/55 137/85  Pulse:  (!) 109 88 90  Resp:  (!) 26 (!) 25 (!) 32  Temp: (!) 97.4 F (36.3 C)     TempSrc: Axillary     SpO2: 95% 99% 96% 95%  Weight:      Height:        Intake/Output Summary (Last 24 hours) at 03/18/2019 1050 Last data filed at 03/18/2019 1000 Gross per 24 hour  Intake 907.7 ml  Output 250 ml  Net 657.7 ml   Last 3 Weights 03/16/2019 03/16/2019 01/08/2019  Weight (lbs) 163 lb 5.8 oz 167 lb 167 lb 3.2 oz  Weight (kg) 74.1 kg 75.751 kg 75.841 kg      Telemetry    A fib/flutter rate 100-120s - Personally Reviewed  ECG    Atrial flutter in the 100's - personally reviewed  Physical Exam   GEN: Confused, agitated overnight Neck: No JVD Cardiac: irreg irreg no murmurs, rubs, or gallops.  Respiratory: rhonchi throughout to auscultation bilaterally. GI: Soft, nontender, non-distended  MS: No edema; No deformity. Neuro:  Nonfocal  Psych: Normal  affect   Labs    High Sensitivity Troponin:   Recent Labs  Lab 03/25/2019 2221 03/16/19 0222  TROPONINIHS 22* 17      Chemistry Recent Labs  Lab 03/04/2019 2221 03/02/2019 2221 03/16/19 1211 03/17/19 0129 03/18/19 0127  NA 138   < > 140 139 140  K 4.3   < > 3.9 3.9 3.9  CL 106   < > 107 109 109  CO2 21*   < > 22 19* 20*  GLUCOSE 124*   < > 118* 113* 134*  BUN 36*   < > 28* 26* 28*  CREATININE 2.41*   < > 1.60* 1.33* 1.32*  CALCIUM 8.3*   < > 7.8* 8.0* 8.3*  PROT 6.4*  --   --   --   --   ALBUMIN 2.8*  --   --   --   --   AST 21  --   --   --   --   ALT 17  --   --   --   --   ALKPHOS 78  --   --   --   --   BILITOT 1.0  --   --   --   --   GFRNONAA 22*   < >  37* 46* 47*  GFRAA 26*   < > 43* 53* 54*  ANIONGAP 11   < > 11 11 11    < > = values in this interval not displayed.     Hematology Recent Labs  Lab 03/18/2019 2221 03/16/19 1115 03/17/19 0129  WBC 11.9* 12.2* 10.5  RBC 3.65* 3.71* 3.70*  HGB 11.4* 11.6* 11.3*  HCT 35.1* 35.6* 35.6*  MCV 96.2 96.0 96.2  MCH 31.2 31.3 30.5  MCHC 32.5 32.6 31.7  RDW 15.2 15.1 14.9  PLT 181 184 199    BNP Recent Labs  Lab 03/16/19 1157  BNP 434.8*     DDimer  Recent Labs  Lab 03/04/2019 2221  DDIMER 2.22*     Radiology    DG Chest Port 1 View  Result Date: 03/17/2019 CLINICAL DATA:  History of COVID-19 positivity with cough and fever EXAM: PORTABLE CHEST 1 VIEW COMPARISON:  03/12/2019 FINDINGS: Increasing right basilar infiltrate is noted with associated effusion. Cardiomegaly is again noted. Pacing device is stable. Left lung is clear. No bony abnormality is noted. IMPRESSION: Increasing right basilar infiltrate and effusion. Electronically Signed   By: Inez Catalina M.D.   On: 03/17/2019 14:26   ECHOCARDIOGRAM COMPLETE  Result Date: 03/17/2019   ECHOCARDIOGRAM REPORT   Patient Name:   Stanley Hunter Date of Exam: 03/17/2019 Medical Rec #:  [id number]    Height:       69.0 in Accession #:    [id number]   Weight:        163.4 lb Date of Birth:  Dec 17, 1924     BSA:          1.90 m Patient Age:    22 years     BP:           141/74 mmHg Patient Gender: M            HR:           125 bpm. Exam Location:  Inpatient Procedure: 2D Echo Indications:    427.31 atrial fibrillation  History:        Patient has no prior history of Echocardiogram examinations.                 Pacemaker, Arrythmias:Atrial Flutter; Risk Factors:Hypertension,                 Dyslipidemia and Former Smoker.  Sonographer:    Jannett Celestine RDCS (AE) Referring Phys: [id number] RIPUDEEP K RAI  Sonographer Comments: restricted mobility IMPRESSIONS  1. Left ventricular ejection fraction, by visual estimation, is 65 to 70%. The left ventricle has hyperdynamic function. There is mildly increased left ventricular hypertrophy.  2. The left ventricle has no regional wall motion abnormalities.  3. Global right ventricle has normal systolic function.The right ventricular size is normal. No increase in right ventricular wall thickness.  4. Left atrial size was normal.  5. Right atrial size was normal.  6. Mild to moderate mitral annular calcification.  7. The mitral valve is normal in structure. No evidence of mitral valve regurgitation. No evidence of mitral stenosis.  8. The tricuspid valve is normal in structure.  9. The tricuspid valve is normal in structure. Tricuspid valve regurgitation is not demonstrated. 10. The aortic valve is normal in structure. Aortic valve regurgitation is not visualized. Mild to moderate aortic valve sclerosis/calcification without any evidence of aortic stenosis. 11. The pulmonic valve was normal in structure. Pulmonic valve regurgitation is not visualized. 12. The inferior vena cava is  normal in size with greater than 50% respiratory variability, suggesting right atrial pressure of 3 mmHg. FINDINGS  Left Ventricle: Left ventricular ejection fraction, by visual estimation, is 65 to 70%. The left ventricle has hyperdynamic function. The left ventricle  has no regional wall motion abnormalities. There is mildly increased left ventricular hypertrophy. Normal left atrial pressure. Right Ventricle: The right ventricular size is normal. No increase in right ventricular wall thickness. Global RV systolic function is has normal systolic function. Left Atrium: Left atrial size was normal in size. Right Atrium: Right atrial size was normal in size Pericardium: There is no evidence of pericardial effusion. Mitral Valve: The mitral valve is normal in structure. Mild to moderate mitral annular calcification. No evidence of mitral valve regurgitation. No evidence of mitral valve stenosis by observation. Tricuspid Valve: The tricuspid valve is normal in structure. Tricuspid valve regurgitation is not demonstrated. Aortic Valve: The aortic valve is normal in structure. Aortic valve regurgitation is not visualized. Mild to moderate aortic valve sclerosis/calcification is present, without any evidence of aortic stenosis. Pulmonic Valve: The pulmonic valve was normal in structure. Pulmonic valve regurgitation is not visualized. Pulmonic regurgitation is not visualized. Aorta: The aortic root, ascending aorta and aortic arch are all structurally normal, with no evidence of dilitation or obstruction. Venous: The inferior vena cava is normal in size with greater than 50% respiratory variability, suggesting right atrial pressure of 3 mmHg. IAS/Shunts: No atrial level shunt detected by color flow Doppler. There is no evidence of a patent foramen ovale. No ventricular septal defect is seen or detected. There is no evidence of an atrial septal defect.  LEFT VENTRICLE PLAX 2D LVIDd:         3.70 cm LVIDs:         2.30 cm LV PW:         1.40 cm LV IVS:        1.00 cm LVOT diam:     2.30 cm LV SV:         40 ml LV SV Index:   20.99 LVOT Area:     4.15 cm  LEFT ATRIUM             Index LA diam:        3.30 cm 1.74 cm/m LA Vol (A2C):   38.9 ml 20.52 ml/m LA Vol (A4C):   44.8 ml 23.63 ml/m  LA Biplane Vol: 44.3 ml 23.37 ml/m  AORTIC VALVE LVOT Vmax:   85.80 cm/s LVOT Vmean:  68.000 cm/s LVOT VTI:    0.154 m  AORTA Ao Root diam: 3.30 cm  SHUNTS Systemic VTI:  0.15 m Systemic Diam: 2.30 cm  Candee Furbish MD Electronically signed by Candee Furbish MD Signature Date/Time: 03/17/2019/3:29:30 PM    Final     Cardiac Studies   Echo pending.  Patient Profile     86 y.o. male with a hx of atrial flutter s/p ablation, sick sinus syndrome status post [employer] pacemaker, symptomatic PVC on Rythmol, hypertension, chronic kidney disease stage III and hyperlipidemia now admitted with hypoxia and PNA.   Pt in atrial fib.    Assessment & Plan    1.  Atrial fibrillation with rapid ventricular rate -He has history of a flutter status post ablation and symptomatic PVC on propafenone - He is not on anticoagulation chronically on heparin now -Heart rate in 140 he is on maximum dose of IV Cardizem - CHADSVASCs score of 3 for age and HTN - Resumed Propafenone 225  every 6 hours - Echo yesterday shows a hyperdynamic ventricle with LVEF 65-70%, normal biatrial size - Considered TEE/DCCV, however, given his confusion and normal LV function with better rate control, would not recommend urgent cardioversion -rather, would recommend continued medical management and consider outpatient DCCV (with his primary cardiologist) after minimum of 3 weeks of anticoagulation on Eliquis.  2.  Pneumonia -Covid and influenza panel negative -Per primary team  3.  Acute on chronic kidney disease -Follow with daily BMET  -Cr 2.41 >>1.60 >>1.33  4. HTN - BP stable. On IV cardizem. Home regimen on hold  5. SSS s/p Pacemaker      For questions or updates, please contact [place] Please consult [email or web address] for contact info under    Pixie Casino, MD, FACC, [place] Director of the [facility] Diplomate of the  American Board of Clinical Lipidology Attending Cardiologist  Direct Dial: [phone]  Fax: [phone]  Website:  [email or web address]  Pixie Casino, MD  03/18/2019, 10:50 AM

## 2019-03-18 NOTE — Progress Notes (Signed)
This chaplain stopped by the Pt. room while rounding in the unit.  The chaplain introduced herself to the Pt. RN-Elizabeth.  The chaplain understands the Pt. is resting at the time of this visit.  The chaplain will F/U with the Pt. and Pt. wife with assistance from the RN.

## 2019-03-18 NOTE — Progress Notes (Signed)
Pharmacy Antibiotic Note  Stanley Hunter is a 86 y.o. male admitted on 03/06/2019 with ShOB, fever, initially started on Ceftriaxone & Azithromycin. Concern for aspiration PNA & Pharmacy has been consulted for Unasyn dosing. Now adding vanc per orders  Plan:  Vanc 1750mg  IV x 1 then 1500mg  IV q48 - goal AUC 400-550  Continue Unasyn 3gm q8 - r/o aspiration  D2 Azithromycin 500mg  po daily   Note that blood culture growing CNS and urine had only 2k CFU of a staph species susceptible to oxacillin  Height: 5\' 9"  (175.3 cm) Weight: 163 lb 5.8 oz (74.1 kg) IBW/kg (Calculated) : 70.7  Temp (24hrs), Avg:97.7 F (36.5 C), Min:97.4 F (36.3 C), Max:97.8 F (36.6 C)  Recent Labs  Lab 03/05/2019 2221 03/16/19 1115 03/16/19 1211 03/16/19 1759 03/17/19 0129 03/18/19 0127  WBC 11.9* 12.2*  --   --  10.5  --   CREATININE 2.41*  --  1.60*  --  1.33* 1.32*  LATICACIDVEN 1.7  --   --  1.3  --   --     Estimated Creatinine Clearance: 35.7 mL/min (A) (by C-G formula based on SCr of 1.32 mg/dL (H)).    Allergies  Allergen Reactions  . Cortisone Other (See Comments)    Unknown reaction  . Neomycin Rash  . Sulfamethoxazole-Trimethoprim Other (See Comments)    Leg weakness, arrhythmia   Antimicrobials this admission: 1/19 doxy x 1 1/19 azith >> 1/19 CTX >> 1/20 1/21 Unasyn >> 1/21 vanc >>   Dose adjustments this admission:  Microbiology results: 1/19 MRSA PCR + 1/19 strep pneumo neg 1/19 BCx2: sent 1/18 BCx2: 1/4 bottles anaerobic :GPC  1/19 I/O cath UCx: 2K Staph warneri, R clinda, R TCN  Thank you for allowing pharmacy to be a part of this patient's care.  Kara Mead PharmD 03/18/2019 3:53 PM

## 2019-03-18 NOTE — Progress Notes (Signed)
Pharmacy Antibiotic Note  Stanley Hunter is a 86 y.o. male admitted on 03/11/2019 with ShOB, fever, initially started on Ceftriaxone & Azithromycin. Concern for aspiration PNA & Pharmacy has been consulted for Unasyn dosing.  Plan: D3 abx, CTX d/c Unasyn 3gm q8 - r/o aspiration D2 Azithromycin 500mg  po daily   Height: 5\' 9"  (175.3 cm) Weight: 163 lb 5.8 oz (74.1 kg) IBW/kg (Calculated) : 70.7  Temp (24hrs), Avg:97.7 F (36.5 C), Min:97.6 F (36.4 C), Max:97.8 F (36.6 C)  Recent Labs  Lab 03/24/2019 2221 03/16/19 1115 03/16/19 1211 03/16/19 1759 03/17/19 0129 03/18/19 0127  WBC 11.9* 12.2*  --   --  10.5  --   CREATININE 2.41*  --  1.60*  --  1.33* 1.32*  LATICACIDVEN 1.7  --   --  1.3  --   --     Estimated Creatinine Clearance: 35.7 mL/min (A) (by C-G formula based on SCr of 1.32 mg/dL (H)).    Allergies  Allergen Reactions  . Cortisone Other (See Comments)    Unknown reaction  . Neomycin Rash  . Sulfamethoxazole-Trimethoprim Other (See Comments)    Leg weakness, arrhythmia   Antimicrobials this admission: 1/19 doxy x 1 1/19 azith >> 1/19 CTX >> 1/20 1/21 Unasyn >>  Dose adjustments this admission:  Microbiology results: 1/19 MRSA PCR + 1/19 strep pneumo neg 1/19 BCx2: sent 1/18 BCx2: 1/4 bottles anaerobic :GPC  1/19 I/O cath UCx: 2K Staph warneri, R clinda, R TCN  Thank you for allowing pharmacy to be a part of this patient's care.  Minda Ditto PharmD 03/18/2019 7:29 AM

## 2019-03-18 NOTE — Progress Notes (Signed)
Patient became very agitated at shift change. Notified Bodenheimer. Administered HS 0.5mg  PO xanax early. Will continue to monitor.

## 2019-03-18 NOTE — Evaluation (Signed)
SLP Cancellation Note  Patient Details Name: Stanley Hunter MRN: [id number] DOB: 11/24/1924   Cancelled treatment:       Reason Eval/Treat Not Completed: Other (comment)(pt lethargic with RNx2 in room assisting to move pt,  requested RN to secure chat SlP when/if pt becomes appropriate for po intake/eval, thanks.)   Stanley Hunter 03/18/2019, 7:47 AM   Kathleen Lime, MS [place] Office [phone]

## 2019-03-18 NOTE — Evaluation (Signed)
Clinical/Bedside Swallow Evaluation Patient Details  Name: Stanley Hunter MRN: [id number] Date of Birth: 1924/12/23  Today's Date: 03/18/2019 Time: SLP Start Time (ACUTE ONLY): 1535 SLP Stop Time (ACUTE ONLY): 1601 SLP Time Calculation (min) (ACUTE ONLY): 26 min  Past Medical History:  Past Medical History:  Diagnosis Date  . Acid reflux 08/03/2015  . Anxiety 08/03/2015  . Avitaminosis D 05/18/2012  . Benign essential HTN 02/09/2009   Overview:  Benign Essential Hypertension   . Benign prostatic hyperplasia with urinary obstruction 12/07/2013  . Chronic kidney disease (CKD), stage III (moderate) 11/04/2011  . CN (constipation) 08/03/2015  . Cognitive changes 08/07/2015  . Degenerative arthritis of hip 05/14/2013  . Degenerative arthritis of spine 05/14/2013  . Degenerative disorder of muscle 08/03/2015  . Dysrhythmia 2001   ablation for a-flutter  . H/O atrial flutter 08/03/2015   Overview:  Pacemaker placed 2015   . High blood pressure   . High cholesterol   . History of pacemaker 2016  . Hx of cardiac cath 2013  . Loss of balance 08/07/2015  . Lumbar canal stenosis 07/07/2013   Overview:  Lumbar laminectomy 07/07/13 - Dr. Lurene Shadow   . Lumbar scoliosis 05/14/2013  . Macular degeneration 08/07/2015  . Premature contractions, supraventricular 12/15/2008   Overview:  Atrial Premature Complex   . Presence of permanent cardiac pacemaker   . Tremor 08/07/2015   hands  . Urinary frequency 08/07/2015   Past Surgical History:  Past Surgical History:  Procedure Laterality Date  . ATRIAL FLUTTER ABLATION  2001  . CATARACT EXTRACTION Bilateral 2015  . CHOLECYSTECTOMY    . COLONOSCOPY    . MASTOIDECTOMY  1934  . NASAL SEPTOPLASTY W/ TURBINOPLASTY Bilateral 10/14/2018   Procedure: NASAL SEPTOPLASTY WITH BILATERAL  TURBINATE REDUCTION;  Surgeon: Leta Baptist, MD;  Location: [place];  Service: ENT;  Laterality: Bilateral;  . PACEMAKER PLACEMENT  2016  . SPINE SURGERY  2016   LUMBER- lower  . TONSILLECTOMY   1934  . TRIGGER FINGER RELEASE Left 12/14/2015   Procedure: RELEASE TRIGGER FINGER/A-1 PULLEY ring finger left;  Surgeon: Daryll Brod, MD;  Location: [place];  Service: Orthopedics;  Laterality: Left;  FAB   HPI:  86 yo male adm to [facility] 2 days previously with difficulty breathing and AMS. Pt found to have pna - CXR today showing progression which is concerning.  Swallow eval ordered.  Pt with PMH + for history significant of coronary artery disease status post. Cardiac catheterization, chronic kidney disease stage III, essential hypertension, degenerative disc joint disease, sinusitis s/p septum surgery in 2020, "Memory difficulty" per Dr Bubba Camp in snapshot, history of pacemaker placement 2015, GERD, who had his Covid vaccine 2 weeks prior to admission, Chest x-ray showed lobar pneumonia.  Concern for aspiration present and swallow eval ordered.   Assessment / Plan / Recommendation Clinical Impression  (P) Clinical swallow evaluation concerning for possible aspiration.  Right facial/labial/nasal fold asymmetry noted compared to left.  His labial strength is intact however.  Subtle *if present* deviation of palate toward left with phonation.  Voice is dysphonic and cough is weak/non productive with elevated RR to mid 40s.  Pt only provided with 2 tsps of water - swallow appeared timely.  After 2nd swallow - delayed significant cough observed.  Pt admits to h/o sensing liquids "going the wrong way" and sometimes having difficulties with solids lodging requiring him to expectorate.  SLP advised pt to not eat if dyspneic and reviewed importance of swallow/respiratory reciprocity using  teach back. MBS planned tomorrow am, pt and RN agreeable.  Pt also has h/o GERD - and this may be contributing to aspiration risk.   SLP Visit Diagnosis: (P) Dysphagia, oropharyngeal phase (R13.12);Dysphagia, unspecified (R13.10)    Aspiration Risk  (P) Severe aspiration risk;Moderate aspiration risk    Diet  Recommendation (P) Thin liquid   Compensations: (P) Slow rate;Small sips/bites Postural Changes: (P) Seated upright at 90 degrees;Remain upright for at least 30 minutes after po intake    Other  Recommendations Oral Care Recommendations: (P) Oral care BID   Follow up Recommendations (P) None(tbd)      Frequency and Duration (P) min 2x/week  (P) 2 weeks       Prognosis   uncertain     Swallow Study   General Date of Onset: 03/18/19 HPI: 86 yo male adm to [facility] 2 days previously with difficulty breathing and AMS. Pt found to have pna - CXR today showing progression which is concerning.  Swallow eval ordered.  Pt with PMH + for history significant of coronary artery disease status post. Cardiac catheterization, chronic kidney disease stage III, essential hypertension, degenerative disc joint disease, sinusitis s/p septum surgery in 2020, "Memory difficulty" per Dr Bubba Camp in snapshot, history of pacemaker placement 2015, GERD, who had his Covid vaccine 2 weeks prior to admission, Chest x-ray showed lobar pneumonia.  Concern for aspiration present and swallow eval ordered. Type of Study: Bedside Swallow Evaluation Diet Prior to this Study: Regular;Thin liquids Temperature Spikes Noted: No Respiratory Status: Nasal cannula History of Recent Intubation: No Behavior/Cognition: Alert;Cooperative;Pleasant mood Oral Cavity Assessment: Within Functional Limits Oral Care Completed by SLP: No Oral Cavity - Dentition: Adequate natural dentition Vision: Functional for self-feeding Patient Positioning: Upright in bed Baseline Vocal Quality: Hoarse;Low vocal intensity Volitional Cough: Weak Volitional Swallow: Unable to elicit(pt with significant increase in RR)    Oral/Motor/Sensory Function Overall Oral Motor/Sensory Function: Mild impairment Facial ROM: Within Functional Limits Facial Symmetry: Abnormal symmetry right Facial Strength: Within Functional Limits Facial Sensation: Reduced  left Lingual ROM: Within Functional Limits Lingual Symmetry: Within Functional Limits Lingual Strength: Within Functional Limits Lingual Sensation: Within Functional Limits Velum: (? minimal deviation to the left upon phonation) Mandible: (dnt)   Ice Chips Ice chips: Not tested   Thin Liquid Thin Liquid: Impaired Presentation: Spoon Pharyngeal  Phase Impairments: Cough - Delayed Other Comments: delayed cough after 2 tsps of water - significant increase in RR noted thus no further trials offered    Nectar Thick Nectar Thick Liquid: Not tested   Honey Thick Honey Thick Liquid: Not tested   Puree Puree: Not tested   Solid     Solid: Not tested      Stanley Hunter 03/18/2019,4:35 PM  Kathleen Lime, MS [place] Office [phone]

## 2019-03-18 NOTE — Progress Notes (Signed)
PIV consult: Second PIV established for incompatible medications. Midline considered but not placed due to pt receiving Unasyn. Please consider central line if pt will continue on Unasyn for prolonged period.

## 2019-03-19 ENCOUNTER — Encounter (HOSPITAL_COMMUNITY): Admission: EM | Disposition: E | Payer: Self-pay | Source: Home / Self Care | Attending: Internal Medicine

## 2019-03-19 ENCOUNTER — Inpatient Hospital Stay (HOSPITAL_COMMUNITY): Payer: Medicare Other

## 2019-03-19 DIAGNOSIS — R531 Weakness: Secondary | ICD-10-CM

## 2019-03-19 DIAGNOSIS — Z7189 Other specified counseling: Secondary | ICD-10-CM

## 2019-03-19 LAB — BASIC METABOLIC PANEL
Anion gap: 11 (ref 5–15)
BUN: 26 mg/dL — ABNORMAL HIGH (ref 8–23)
CO2: 20 mmol/L — ABNORMAL LOW (ref 22–32)
Calcium: 8.1 mg/dL — ABNORMAL LOW (ref 8.9–10.3)
Chloride: 111 mmol/L (ref 98–111)
Creatinine, Ser: 1.26 mg/dL — ABNORMAL HIGH (ref 0.61–1.24)
GFR calc Af Amer: 57 mL/min — ABNORMAL LOW (ref >60)
GFR calc non Af Amer: 49 mL/min — ABNORMAL LOW (ref >60)
Glucose, Bld: 141 mg/dL — ABNORMAL HIGH (ref 70–99)
Potassium: 3.8 mmol/L (ref 3.5–5.1)
Sodium: 142 mmol/L (ref 135–145)

## 2019-03-19 LAB — CBC WITH DIFFERENTIAL/PLATELET
Abs Immature Granulocytes: 0.07 10*3/uL (ref 0.00–0.07)
Basophils Absolute: 0 10*3/uL (ref 0.0–0.1)
Basophils Relative: 0 %
Eosinophils Absolute: 0.1 10*3/uL (ref 0.0–0.5)
Eosinophils Relative: 1 %
HCT: 34 % — ABNORMAL LOW (ref 39.0–52.0)
Hemoglobin: 10.8 g/dL — ABNORMAL LOW (ref 13.0–17.0)
Immature Granulocytes: 1 %
Lymphocytes Relative: 7 %
Lymphs Abs: 0.8 10*3/uL (ref 0.7–4.0)
MCH: 30.7 pg (ref 26.0–34.0)
MCHC: 31.8 g/dL (ref 30.0–36.0)
MCV: 96.6 fL (ref 80.0–100.0)
Monocytes Absolute: 1.4 10*3/uL — ABNORMAL HIGH (ref 0.1–1.0)
Monocytes Relative: 13 %
Neutro Abs: 8.8 10*3/uL — ABNORMAL HIGH (ref 1.7–7.7)
Neutrophils Relative %: 78 %
Platelets: 202 10*3/uL (ref 150–400)
RBC: 3.52 MIL/uL — ABNORMAL LOW (ref 4.22–5.81)
RDW: 14.9 % (ref 11.5–15.5)
WBC: 11.2 10*3/uL — ABNORMAL HIGH (ref 4.0–10.5)
nRBC: 0 % (ref 0.0–0.2)

## 2019-03-19 LAB — CULTURE, BLOOD (ROUTINE X 2): Special Requests: ADEQUATE

## 2019-03-19 SURGERY — ECHOCARDIOGRAM, TRANSESOPHAGEAL
Anesthesia: Monitor Anesthesia Care

## 2019-03-19 MED ORDER — IOHEXOL 350 MG/ML SOLN
100.0000 mL | Freq: Once | INTRAVENOUS | Status: AC | PRN
  Administered 2019-03-19: 100 mL via INTRAVENOUS

## 2019-03-19 MED ORDER — SODIUM CHLORIDE (PF) 0.9 % IJ SOLN
INTRAMUSCULAR | Status: AC
  Filled 2019-03-19: qty 50

## 2019-03-19 MED ORDER — DILTIAZEM HCL ER COATED BEADS 180 MG PO CP24
360.0000 mg | ORAL_CAPSULE | Freq: Every day | ORAL | Status: DC
  Administered 2019-03-19 – 2019-03-21 (×3): 360 mg via ORAL
  Filled 2019-03-19 (×3): qty 2

## 2019-03-19 NOTE — Progress Notes (Signed)
Modified Barium Swallow Progress Note  Patient Details  Name: Stanley Hunter MRN: [id number] Date of Birth: 1924-05-31  Today's Date: 02/28/2019  Modified Barium Swallow completed.  Full report located under Chart Review in the Imaging Section.  Brief recommendations include the following:  Clinical Impression  Patient presents with mild pharyngoesophageal dysphagia with decreased CP/UES relaxation/opening resulting in minimal backflow of barium to distal pharynx.  No aspiration or penetration observed and swallow was timely and strong.  Pt does appear with anterior protrusion of cervical spine at C3-C4 into pharynx which at times diminished epiglottic deflection.   Pt consumed thin via straw sequentially with adequate protection of airway.  Adequate laryngeal elevation/closure observed.  Of note, pt reported sensation of residuals in pharynx x1 after cracker bolus swallow.      Upon esophageal sweep after liquid consumption, he appeared with some retrograde propulsion of thin - ? due to dysmotility.  Recommend continue diet as tolerated being mindful of pt's work of breathing due to risk of impairing reciprocity of swallow/breathing. SLP to follow briefly for esophageal/respiratory precautions.  Thanks for this consult.   Swallow Evaluation Recommendations       SLP Diet Recommendations: (as tolerated)       Medication Administration: (as tolerated with puree if problematic)   Supervision: Patient able to self feed   Compensations: (start all po with liquid, consume liquids during meal)   Postural Changes: Remain semi-upright after after feeds/meals (Comment);Seated upright at 90 degrees   Oral Care Recommendations: Oral care before and after PO   Other Recommendations: Have oral suction available   Kathleen Lime, MS [facility] SLP Acute Rehab Services Office [phone]  Macario Golds 03/22/2019,9:53 AM

## 2019-03-19 NOTE — Progress Notes (Signed)
OT Cancellation Note  Patient Details Name: Stanley Hunter MRN: [id number] DOB: 1924/05/11   Cancelled Treatment:    Reason Eval/Treat Not Completed: Medical issues which prohibited therapy  VQ scan pending. Will check back later today, if clear.  Evolett Somarriba 03/13/2019, 8:16 AM  Karsten Ro, OTR/L Acute Rehabilitation Services 03/22/2019

## 2019-03-19 NOTE — Progress Notes (Signed)
Pt requiring 12L/min O2 via HFNC to keep stats above 92%. SOB noted at rest. Educated pt on incentive spirometer. Pt was able to get up to 750 x3. Will cont to encourage incentive spirometry use with a goal of 1000. Resting in bed watching TV at this time. VSS. Voices no pain at this time. Will cont to mx.

## 2019-03-19 NOTE — Progress Notes (Signed)
Progress Note  Patient Name: Stanley Hunter Date of Encounter: 03/21/2019  Primary Cardiologist: No primary care provider on file. Dr. Georg Ruddle--- [facility]   Subjective   Wakes easily, no pain, stated no SOB.   Inpatient Medications    Scheduled Meds: . apixaban  5 mg Oral BID  . atorvastatin  20 mg Oral q1800  . azithromycin  500 mg Oral Daily  . Chlorhexidine Gluconate Cloth  6 each Topical Daily  . dextromethorphan-guaiFENesin  1 tablet Oral BID  . levalbuterol  0.63 mg Nebulization Q6H  . mouth rinse  15 mL Mouth Rinse BID  . mupirocin ointment  1 application Nasal BID  . pantoprazole  20 mg Oral Daily  . propafenone  225 mg Oral Q6H  . tamsulosin  0.4 mg Oral QHS   Continuous Infusions: . ampicillin-sulbactam (UNASYN) IV 3 g (03/01/2019 0803)  . diltiazem (CARDIZEM) infusion 15 mg/hr (03/10/2019 0542)  . [START ON 03/20/2019] vancomycin     PRN Meds: ALPRAZolam, levalbuterol   Vital Signs    Vitals:   03/18/2019 0445 03/16/2019 0500 03/27/2019 0600 03/21/2019 0700  BP:  (!) 126/49 121/62 116/77  Pulse: 89 76 79 88  Resp: 20 (!) 24 (!) 24 (!) 22  Temp:      TempSrc:      SpO2: 92% 93% 93% 92%  Weight:      Height:        Intake/Output Summary (Last 24 hours) at 03/18/2019 0853 Last data filed at 03/01/2019 0451 Gross per 24 hour  Intake 614.2 ml  Output 400 ml  Net 214.2 ml   Last 3 Weights 03/16/2019 03/16/2019 01/08/2019  Weight (lbs) 163 lb 5.8 oz 167 lb 167 lb 3.2 oz  Weight (kg) 74.1 kg 75.751 kg 75.841 kg      Telemetry    A fib rate controlled - Personally Reviewed  ECG    Atrial flutter with lateral T wave depression lateral leads - Personally Reviewed  Physical Exam   GEN: No acute distress.   Neck: No JVD Cardiac: irreg irreg, no murmurs, rubs, or gallops.  Respiratory: + wheezes to auscultation bilaterally. GI: Soft, nontender, non-distended  MS: No edema; No deformity. Neuro:  Nonfocal  Psych: Normal affect   Labs    High Sensitivity  Troponin:   Recent Labs  Lab 03/01/2019 2221 03/16/19 0222  TROPONINIHS 22* 17      Chemistry Recent Labs  Lab 03/22/2019 2221 03/16/19 1211 03/17/19 0129 03/18/19 0127 03/15/2019 0147  NA 138   < > 139 140 142  K 4.3   < > 3.9 3.9 3.8  CL 106   < > 109 109 111  CO2 21*   < > 19* 20* 20*  GLUCOSE 124*   < > 113* 134* 141*  BUN 36*   < > 26* 28* 26*  CREATININE 2.41*   < > 1.33* 1.32* 1.26*  CALCIUM 8.3*   < > 8.0* 8.3* 8.1*  PROT 6.4*  --   --   --   --   ALBUMIN 2.8*  --   --   --   --   AST 21  --   --   --   --   ALT 17  --   --   --   --   ALKPHOS 78  --   --   --   --   BILITOT 1.0  --   --   --   --   [facility]  22*   < > 46* 47* 49*  GFRAA 26*   < > 53* 54* 57*  ANIONGAP 11   < > 11 11 11    < > = values in this interval not displayed.     Hematology Recent Labs  Lab 03/16/19 1115 03/17/19 0129 02/27/2019 0147  WBC 12.2* 10.5 11.2*  RBC 3.71* 3.70* 3.52*  HGB 11.6* 11.3* 10.8*  HCT 35.6* 35.6* 34.0*  MCV 96.0 96.2 96.6  MCH 31.3 30.5 30.7  MCHC 32.6 31.7 31.8  RDW 15.1 14.9 14.9  PLT 184 199 202    BNP Recent Labs  Lab 03/16/19 1157  BNP 434.8*     DDimer  Recent Labs  Lab 03/14/2019 2221  DDIMER 2.22*     Radiology    DG Chest Port 1 View  Result Date: 03/17/2019 CLINICAL DATA:  History of COVID-19 positivity with cough and fever EXAM: PORTABLE CHEST 1 VIEW COMPARISON:  03/28/2019 FINDINGS: Increasing right basilar infiltrate is noted with associated effusion. Cardiomegaly is again noted. Pacing device is stable. Left lung is clear. No bony abnormality is noted. IMPRESSION: Increasing right basilar infiltrate and effusion. Electronically Signed   By: Inez Catalina M.D.   On: 03/17/2019 14:26   ECHOCARDIOGRAM COMPLETE  Result Date: 03/17/2019   ECHOCARDIOGRAM REPORT   Patient Name:   Stanley Hunter Date of Exam: 03/17/2019 Medical Rec #:  [id number]    Height:       69.0 in Accession #:    [id number]   Weight:       163.4 lb Date of Birth:  09/23/1924      BSA:          1.90 m Patient Age:    86 years     BP:           141/74 mmHg Patient Gender: M            HR:           125 bpm. Exam Location:  Inpatient Procedure: 2D Echo Indications:    427.31 atrial fibrillation  History:        Patient has no prior history of Echocardiogram examinations.                 Pacemaker, Arrythmias:Atrial Flutter; Risk Factors:Hypertension,                 Dyslipidemia and Former Smoker.  Sonographer:    Jannett Celestine RDCS (AE) Referring Phys: [id number] RIPUDEEP K RAI  Sonographer Comments: restricted mobility IMPRESSIONS  1. Left ventricular ejection fraction, by visual estimation, is 65 to 70%. The left ventricle has hyperdynamic function. There is mildly increased left ventricular hypertrophy.  2. The left ventricle has no regional wall motion abnormalities.  3. Global right ventricle has normal systolic function.The right ventricular size is normal. No increase in right ventricular wall thickness.  4. Left atrial size was normal.  5. Right atrial size was normal.  6. Mild to moderate mitral annular calcification.  7. The mitral valve is normal in structure. No evidence of mitral valve regurgitation. No evidence of mitral stenosis.  8. The tricuspid valve is normal in structure.  9. The tricuspid valve is normal in structure. Tricuspid valve regurgitation is not demonstrated. 10. The aortic valve is normal in structure. Aortic valve regurgitation is not visualized. Mild to moderate aortic valve sclerosis/calcification without any evidence of aortic stenosis. 11. The pulmonic valve was normal in structure. Pulmonic valve regurgitation is not visualized. 12.  The inferior vena cava is normal in size with greater than 50% respiratory variability, suggesting right atrial pressure of 3 mmHg. FINDINGS  Left Ventricle: Left ventricular ejection fraction, by visual estimation, is 65 to 70%. The left ventricle has hyperdynamic function. The left ventricle has no regional wall motion  abnormalities. There is mildly increased left ventricular hypertrophy. Normal left atrial pressure. Right Ventricle: The right ventricular size is normal. No increase in right ventricular wall thickness. Global RV systolic function is has normal systolic function. Left Atrium: Left atrial size was normal in size. Right Atrium: Right atrial size was normal in size Pericardium: There is no evidence of pericardial effusion. Mitral Valve: The mitral valve is normal in structure. Mild to moderate mitral annular calcification. No evidence of mitral valve regurgitation. No evidence of mitral valve stenosis by observation. Tricuspid Valve: The tricuspid valve is normal in structure. Tricuspid valve regurgitation is not demonstrated. Aortic Valve: The aortic valve is normal in structure. Aortic valve regurgitation is not visualized. Mild to moderate aortic valve sclerosis/calcification is present, without any evidence of aortic stenosis. Pulmonic Valve: The pulmonic valve was normal in structure. Pulmonic valve regurgitation is not visualized. Pulmonic regurgitation is not visualized. Aorta: The aortic root, ascending aorta and aortic arch are all structurally normal, with no evidence of dilitation or obstruction. Venous: The inferior vena cava is normal in size with greater than 50% respiratory variability, suggesting right atrial pressure of 3 mmHg. IAS/Shunts: No atrial level shunt detected by color flow Doppler. There is no evidence of a patent foramen ovale. No ventricular septal defect is seen or detected. There is no evidence of an atrial septal defect.  LEFT VENTRICLE PLAX 2D LVIDd:         3.70 cm LVIDs:         2.30 cm LV PW:         1.40 cm LV IVS:        1.00 cm LVOT diam:     2.30 cm LV SV:         40 ml LV SV Index:   20.99 LVOT Area:     4.15 cm  LEFT ATRIUM             Index LA diam:        3.30 cm 1.74 cm/m LA Vol (A2C):   38.9 ml 20.52 ml/m LA Vol (A4C):   44.8 ml 23.63 ml/m LA Biplane Vol: 44.3 ml  23.37 ml/m  AORTIC VALVE LVOT Vmax:   85.80 cm/s LVOT Vmean:  68.000 cm/s LVOT VTI:    0.154 m  AORTA Ao Root diam: 3.30 cm  SHUNTS Systemic VTI:  0.15 m Systemic Diam: 2.30 cm  Candee Furbish MD Electronically signed by Candee Furbish MD Signature Date/Time: 03/17/2019/3:29:30 PM    Final     Cardiac Studies   Echo 03/17/19  IMPRESSIONS    1. Left ventricular ejection fraction, by visual estimation, is 65 to 70%. The left ventricle has hyperdynamic function. There is mildly increased left ventricular hypertrophy.  2. The left ventricle has no regional wall motion abnormalities.  3. Global right ventricle has normal systolic function.The right ventricular size is normal. No increase in right ventricular wall thickness.  4. Left atrial size was normal.  5. Right atrial size was normal.  6. Mild to moderate mitral annular calcification.  7. The mitral valve is normal in structure. No evidence of mitral valve regurgitation. No evidence of mitral stenosis.  8. The tricuspid valve is  normal in structure.  9. The tricuspid valve is normal in structure. Tricuspid valve regurgitation is not demonstrated. 10. The aortic valve is normal in structure. Aortic valve regurgitation is not visualized. Mild to moderate aortic valve sclerosis/calcification without any evidence of aortic stenosis. 11. The pulmonic valve was normal in structure. Pulmonic valve regurgitation is not visualized. 12. The inferior vena cava is normal in size with greater than 50% respiratory variability, suggesting right atrial pressure of 3 mmHg.  FINDINGS  Left Ventricle: Left ventricular ejection fraction, by visual estimation, is 65 to 70%. The left ventricle has hyperdynamic function. The left ventricle has no regional wall motion abnormalities. There is mildly increased left ventricular hypertrophy.  Normal left atrial pressure.  Right Ventricle: The right ventricular size is normal. No increase in right ventricular wall  thickness. Global RV systolic function is has normal systolic function.  Left Atrium: Left atrial size was normal in size.  Right Atrium: Right atrial size was normal in size  Pericardium: There is no evidence of pericardial effusion.  Mitral Valve: The mitral valve is normal in structure. Mild to moderate mitral annular calcification. No evidence of mitral valve regurgitation. No evidence of mitral valve stenosis by observation.  Tricuspid Valve: The tricuspid valve is normal in structure. Tricuspid valve regurgitation is not demonstrated.  Aortic Valve: The aortic valve is normal in structure. Aortic valve regurgitation is not visualized. Mild to moderate aortic valve sclerosis/calcification is present, without any evidence of aortic stenosis.  Pulmonic Valve: The pulmonic valve was normal in structure. Pulmonic valve regurgitation is not visualized. Pulmonic regurgitation is not visualized.  Aorta: The aortic root, ascending aorta and aortic arch are all structurally normal, with no evidence of dilitation or obstruction.  Venous: The inferior vena cava is normal in size with greater than 50% respiratory variability, suggesting right atrial pressure of 3 mmHg.  IAS/Shunts: No atrial level shunt detected by color flow Doppler. There is no evidence of a patent foramen ovale. No ventricular septal defect is seen or detected. There is no evidence of an atrial septal defect.      Patient Profile     86 y.o. male with a hx of atrial flutter s/p ablation, sick sinus syndrome status postMedtronicpacemaker, symptomatic PVC on Rythmol, hypertension, chronic kidney disease stage III and hyperlipidemia now admitted with hypoxia and PNA.   Pt in atrial fib   Assessment & Plan    1.  Atrial fibrillation with rapid ventricular rate initially now rate controlled  -He has history of a flutter status post ablation and symptomatic PVC onpropafenone - He is not onanticoagulation  chronically on eliquis now -Heart rate was 140 on maximum dose, now rate controlled on 15 mg of IV Cardizem - CHADSVASCs score of 3 for age and HTN - Echo yesterday shows a hyperdynamic ventricle with LVEF 65-70%, normal biatrial size - Considered TEE/DCCV, however, given his confusion and normal LV function with better rate control, would not recommend urgent cardioversion -rather, would recommend continued medical management and consider outpatient DCCV (with his primary cardiologist) after minimum of 3 weeks of anticoagulation on Eliquis.  2.Pneumonia -Covid and influenza panel negative -sp02 still low for VQ scan today -Per primary team  3.Acute on chronic kidney disease -Follow with daily BMET  -Cr 2.41 >>1.60 >>1.33>>1.26  4. HTN - BP stable. On IV cardizem. Home regimen on hold  5. SSS s/p Pacemaker          For questions or updates, please contact [place] Please  consult [email or web address] for contact info under        Signed, Cecilie Kicks, NP  03/06/2019, 8:53 AM

## 2019-03-19 NOTE — Progress Notes (Signed)
  Speech Language Pathology Treatment: Dysphagia  Patient Details Name: Stanley Hunter MRN: [id number] DOB: 12/24/1924 Today's Date: 03/08/2019 Time: [id number] SLP Time Calculation (min) (ACUTE ONLY): 11 min  Assessment / Plan / Recommendation Clinical Impression  SlP reviewed MBS video loops with pt and his wife - focusing on decreased UES opening with minimal backflow of barium into distal pharynx and indication to eat small frequent meals, staying fully upright for at least 30 min after.  Also provided hand outs *reviewed orally* regarding mitigating aspiraton risk with respiratory difficulties, xerostomia compensations, esophageal precautions, heimlich manuever and swallow precaution signs for specific recommendations tailored to results of MBS.  Wife and pt verbalized understanding and clinical reasoning for recommendations.  Will follow up next week if pt remains in house. Strictly advised pt NOT TO EAT if dyspenic due to episodic aspiration risk.     HPI HPI: 86 yo male adm to [facility] 2 days previously with difficulty breathing and AMS. Pt found to have pna - CXR today showing progression which is concerning.  Swallow eval ordered.  Pt with PMH + for history significant of coronary artery disease status post. Cardiac catheterization, chronic kidney disease stage III, essential hypertension, degenerative disc joint disease, sinusitis s/p septum surgery in 2020, "Memory difficulty" per Dr Bubba Camp in snapshot, history of pacemaker placement 2015, GERD, who had his Covid vaccine 2 weeks prior to admission, Chest x-ray showed lobar pneumonia.  Concern for aspiration present and swallow eval ordered.      SLP Plan  Continue with current plan of care       Recommendations  Diet recommendations: (as tolerated, avoid particulate and hard foods for energy conservation and decr asp risk) Liquids provided via: Cup;Straw Medication Administration: Whole meds with liquid Compensations: Slow rate;Small  sips/bites Postural Changes and/or Swallow Maneuvers: Seated upright 90 degrees;Upright 30-60 min after meal(drink liquids t/o meals)                Oral Care Recommendations: Oral care BID Follow up Recommendations: None(tbd) SLP Visit Diagnosis: Dysphagia, oropharyngeal phase (R13.12);Dysphagia, unspecified (R13.10) Plan: Continue with current plan of care       [place], Stanley Hunter Ann 03/21/2019, 1:18 PM   Stanley Lime, MS [place] Office [phone]

## 2019-03-19 NOTE — Consult Note (Signed)
Consultation Note Date: 03/17/2019   Patient Name: Stanley Hunter  DOB: 1924/12/02  MRN: [id number]  Age / Sex: 86 y.o., male  PCP: Mast, Man X, NP Referring Physician: Alma Friendly, MD  Reason for Consultation: Establishing goals of care  HPI/Patient Profile: 86 y.o. male  admitted on 03/18/2019   Stanley Hunter a 86 y.o.malewith medical history significant ofcoronary artery disease status post. Cardiac catheterization, chronic kidney disease stage III, essential hypertension, degenerative disc joint disease, history of pacemaker placement 2015, who had his Covid vaccine2 weeks agoand presents with shortness of breath, cough and fever. Patient also has lower extremity edema. He was found to be hypoxic. Chest x-ray showed lobar pneumonia.  Clinical Assessment and Goals of Care:  Patient remains admitted to hospital medicine service, in stepdown unit at [facility] in [place] with acute hypoxic respiratory failure secondary to pneumonia, A fib with RVR, possible CHF. Cardiology is also following.   Palliative consultation for goals of care discussions.   Palliative medicine is specialized medical care for people living with serious illness. It focuses on providing relief from the symptoms and stress of a serious illness. The goal is to improve quality of life for both the patient and the family.  Goals of care: Broad aims of medical therapy in relation to the patient's values and preferences. Our aim is to provide medical care aimed at enabling patients to achieve the goals that matter most to them, given the circumstances of their particular medical situation and their constraints.   Patient is resting in bed, he is in no distress. He doesn't arouse to gentle voice commands, he is not with any non verbal gestures of distress/discomfort, no wincing no grimacing, no furrowing of the brow,  no clenching of the jaw.   Chart reviewed, patient seen and examined. Attempted to call ms Stanley Hunter, patient's wife at [id number] without success, PMT to re attempt over the weekend. Continue current mode of care for now.   NEXT OF KIN  wife.   SUMMARY OF RECOMMENDATIONS    Agree with DNR Continue current mode of care, monitor PO intake, participation with PT and overall hospital course and disease trajectory, so as to help guide further decision making.   Code Status/Advance Care Planning:  DNR    Symptom Management:    as above   Palliative Prophylaxis:   Bowel Regimen   Psycho-social/Spiritual:   Desire for further Chaplaincy support:yes  Additional Recommendations: Caregiving  Support/Resources  Prognosis:   Unable to determine  Discharge Planning: To Be Determined      Primary Diagnoses: Present on Admission: . Hypertensive heart and renal disease . Anemia of chronic disease . Hyperlipidemia . Stage 3b chronic kidney disease . Pure hypercholesterolemia . CAP (community acquired pneumonia)   I have reviewed the medical record, interviewed the patient and family, and examined the patient. The following aspects are pertinent.  Past Medical History:  Diagnosis Date  . Acid reflux 08/03/2015  . Anxiety 08/03/2015  . Avitaminosis D 05/18/2012  .  Benign essential HTN 02/09/2009   Overview:  Benign Essential Hypertension   . Benign prostatic hyperplasia with urinary obstruction 12/07/2013  . Chronic kidney disease (CKD), stage III (moderate) 11/04/2011  . CN (constipation) 08/03/2015  . Cognitive changes 08/07/2015  . Degenerative arthritis of hip 05/14/2013  . Degenerative arthritis of spine 05/14/2013  . Degenerative disorder of muscle 08/03/2015  . Dysrhythmia 2001   ablation for a-flutter  . H/O atrial flutter 08/03/2015   Overview:  Pacemaker placed 2015   . High blood pressure   . High cholesterol   . History of pacemaker 2016  . Hx of cardiac cath 2013  .  Loss of balance 08/07/2015  . Lumbar canal stenosis 07/07/2013   Overview:  Lumbar laminectomy 07/07/13 - Dr. Lurene Shadow   . Lumbar scoliosis 05/14/2013  . Macular degeneration 08/07/2015  . Premature contractions, supraventricular 12/15/2008   Overview:  Atrial Premature Complex   . Presence of permanent cardiac pacemaker   . Tremor 08/07/2015   hands  . Urinary frequency 08/07/2015   Social History   Socioeconomic History  . Marital status: Married    Spouse name: Not on file  . Number of children: Not on file  . Years of education: Not on file  . Highest education level: Not on file  Occupational History  . Not on file  Tobacco Use  . Smoking status: Former Smoker    Years: 50.00    Types: Cigarettes    Quit date: 08/03/1975    Years since quitting: 43.6  . Smokeless tobacco: Never Used  . Tobacco comment: smoked 6 cig dialy   Substance and Sexual Activity  . Alcohol use: Yes    Alcohol/week: 3.0 standard drinks    Types: 3 Standard drinks or equivalent per week    Comment: 3 times a week  . Drug use: No  . Sexual activity: Not on file  Other Topics Concern  . Not on file  Social History Narrative   Lives at [facility]  Since 4 /16/2016 with wife Stanley Hunter   Diet: n/a   Caffeine: Yes   Married: yes, 1957   House: Yes, 2 persons   Pets: no pets    Current/Past profession: Optometrist    Exercise: No   Living Will: Yes   DNR: Yes   POA/HPOA: Yes   Walks with walker   Social Determinants of Health   Financial Resource Strain:   . Difficulty of Paying Living Expenses: Not on file  Food Insecurity:   . Worried About Charity fundraiser in the Last Year: Not on file  . Ran Out of Food in the Last Year: Not on file  Transportation Needs:   . Lack of Transportation (Medical): Not on file  . Lack of Transportation (Non-Medical): Not on file  Physical Activity:   . Days of Exercise per Week: Not on file  . Minutes of Exercise per Session: Not on file  Stress:   .  Feeling of Stress : Not on file  Social Connections:   . Frequency of Communication with Friends and Family: Not on file  . Frequency of Social Gatherings with Friends and Family: Not on file  . Attends Religious Services: Not on file  . Active Member of Clubs or Organizations: Not on file  . Attends Archivist Meetings: Not on file  . Marital Status: Not on file   History reviewed. No pertinent family history. Scheduled Meds: . apixaban  5 mg Oral BID  .  atorvastatin  20 mg Oral q1800  . azithromycin  500 mg Oral Daily  . Chlorhexidine Gluconate Cloth  6 each Topical Daily  . dextromethorphan-guaiFENesin  1 tablet Oral BID  . diltiazem  360 mg Oral Daily  . levalbuterol  0.63 mg Nebulization Q6H  . mouth rinse  15 mL Mouth Rinse BID  . mupirocin ointment  1 application Nasal BID  . pantoprazole  20 mg Oral Daily  . propafenone  225 mg Oral Q6H  . tamsulosin  0.4 mg Oral QHS   Continuous Infusions: . ampicillin-sulbactam (UNASYN) IV Stopped (03/18/2019 0935)  . [START ON 03/20/2019] vancomycin     PRN Meds:.ALPRAZolam, levalbuterol Medications Prior to Admission:  Prior to Admission medications   Medication Sig Start Date End Date Taking? Authorizing Provider  ALPRAZolam Duanne Moron) 0.5 MG tablet Take 0.5 mg by mouth at bedtime as needed for sleep.    Yes [provider]  amLODipine (NORVASC) 10 MG tablet TAKE 1 TABLET DAILY Patient taking differently: Take 10 mg by mouth daily.  01/18/19  Yes Virgie Dad, MD  aspirin EC 81 MG tablet Take 81 mg by mouth. Twice a week - Monday & Thursday   Yes [provider]  atorvastatin (LIPITOR) 20 MG tablet TAKE 1 TABLET DAILY Patient taking differently: Take 20 mg by mouth daily.  12/01/18  Yes Virgie Dad, MD  Cholecalciferol (VITAMIN D) 50 MCG (2000 UT) tablet Take 2,000 Units by mouth daily.   Yes [provider]  magnesium hydroxide (MILK OF MAGNESIA) 800 MG/5ML suspension Take 30 mLs by mouth as  needed for constipation.   Yes [provider]  Multiple Vitamins-Minerals (PRESERVISION AREDS) CAPS Take 1 capsule by mouth 2 (two) times a day.    Yes [provider]  pantoprazole (PROTONIX) 20 MG tablet TAKE 1 TABLET DAILY Patient taking differently: Take 20 mg by mouth daily.  12/22/18  Yes Virgie Dad, MD  propafenone (RYTHMOL) 150 MG tablet Take 225 mg by mouth 4 (four) times daily.    Yes [provider]  tamsulosin (FLOMAX) 0.4 MG CAPS capsule TAKE 1 CAPSULE AT BEDTIME Patient taking differently: Take 0.4 mg by mouth at bedtime.  06/22/18  Yes Virgie Dad, MD   Allergies  Allergen Reactions  . Cortisone Other (See Comments)    Unknown reaction  . Neomycin Rash  . Sulfamethoxazole-Trimethoprim Other (See Comments)    Leg weakness, arrhythmia   Review of Systems Non verbal  Physical Exam No distress Lethargic, doesn't open eyes, doesn't follow commands Has coarse breath sounds No edema S 1 S 2 Abdomen soft non tender  Vital Signs: BP (!) 124/57   Pulse 86   Temp (!) 97.4 F (36.3 C) (Oral)   Resp 20   Ht 5\' 9"  (1.753 m)   Wt 74.1 kg   SpO2 94%   BMI 24.12 kg/m  Pain Scale: 0-10   Pain Score: 0-No pain   SpO2: SpO2: 94 % O2 Device:SpO2: 94 % O2 Flow Rate: .O2 Flow Rate (L/min): 10 L/min  IO: Intake/output summary:   Intake/Output Summary (Last 24 hours) at 03/11/2019 1240 Last data filed at 03/11/2019 0451 Gross per 24 hour  Intake 448.41 ml  Output 400 ml  Net 48.41 ml    LBM: Last BM Date: 03/17/19 Baseline Weight: Weight: 75.8 kg Most recent weight: Weight: 74.1 kg     Palliative Assessment/Data:   PPS 30%  Time In:  11 Time Out:  12 Time Total:  60 min.  Greater than 50%  of this time was spent counseling and coordinating care related to the above assessment and plan.  Signed by: Loistine Chance, MD   Please contact Palliative Medicine Team phone at [phone] for questions and concerns.  For individual  provider: See Shea Evans

## 2019-03-19 NOTE — Progress Notes (Signed)
PT Cancellation Note  Patient Details Name: Stanley Hunter MRN: [id number] DOB: 30-Oct-1924   Cancelled Treatment:     PT order received but evaluation deferred on advice of RN - pt very fatigued and with ++ SOB with minimal exertion.  RN advises palliative care consult pending as well.  Will follow.   Majid Mccravy 03/17/2019, 1:13 PM

## 2019-03-19 NOTE — Progress Notes (Signed)
PROGRESS NOTE  Stanley Hunter [id number] DOB: December 14, 1924 DOA: 03/25/2019 PCP: Mast, Man X, NP  HPI/Recap of past 24 hours: HPI from Dr Ladona Ridgel is a 86 y.o. male with medical history significant of coronary artery disease status post. Cardiac catheterization, chronic kidney disease stage III, essential hypertension, degenerative disc joint disease, history of pacemaker placement 2015, who had his Covid vaccine 2 weeks ago and presents with shortness of breath, cough and fever. Patient also has lower extremity edema. He was found to be hypoxic. Chest x-ray showed lobar pneumonia. In the ED, patient noted to be hypoxic, Covid test negative.  Patient admitted for further management      Patient still noted to be requiring about 10L high flow nasal cannula, remains intermittently confused, significantly short of breath, denies any chest pain, fever/chills.     Assessment/Plan: Principal Problem:   CAP (community acquired pneumonia) Active Problems:   Pure hypercholesterolemia   History of pacemaker   Hypertensive heart and renal disease   Anemia of chronic disease   Hyperlipidemia   Stage 3b chronic kidney disease   Persistent atrial fibrillation (HCC)   Acute hypoxic respiratory failure likely 2/2 ?CAP, r/o PE Currently still requiring more O2, currently at about 10 L Afebrile, with no leukocytosis Urine strep pneumo negative, Legionella pending COVID-19, influenza panel negative Chest x-ray with right basilar infiltrate CTA chest pending SLP on board S/P ceftriaxone, switched to IV Unasyn, continue azithromycin, will add vancomycin (MRSA PCR +) DuoNeb, Supplemental O2 as needed Monitor closely  A. fib with RVR HR with better control Had previous flutter ablation, pacemaker implantation secondary to SSS Venous Dopplers lower extremity negative for DVT Cardiology on board, hold off on cardioversion S/p Cardizem drip, switch to p.o Cardizem, continue  propafenone Start Eliquis  ??UTI Urine culture grew Staphylococcus warneri, 2000 colonies Continue vancomycin as above  ?CHF Elevated BNP, 434.8 Echo with EF of 65 to 70%, no regional wall motion abnormality  AKI on CKD stage IIIb Improving Baseline creatinine 1.9 Daily BMP  Hypertension Hold home Norvasc Continue diltiazem  Hyperlipidemia Continue statins  GERD Continue PPI  BPH Continue Flomax  GOC Poor prognosis, palliative consulted         Malnutrition Type:      Malnutrition Characteristics:      Nutrition Interventions:       Estimated body mass index is 24.12 kg/m as calculated from the following:   Height as of this encounter: 5\' 9"  (1.753 m).   Weight as of this encounter: 74.1 kg.     Code Status: DNR  Family Communication: Spoke to wife on 03/17/2019  Disposition Plan: May require SNF pending clinical improvement   Consultants:  Cardiology  Procedures:  None  Antimicrobials:  Azithromycin  Unasyn  Vancomycin  DVT prophylaxis: Eliquis   Objective: Vitals:   03/12/2019 1000 03/28/2019 1001 02/28/2019 1239 03/09/2019 1501  BP: (!) 124/57     Pulse: 88 86    Resp: (!) 27 20    Temp:   (!) 97.4 F (36.3 C)   TempSrc:   Oral   SpO2: 92% 94%  91%  Weight:      Height:        Intake/Output Summary (Last 24 hours) at 03/03/2019 1543 Last data filed at 03/22/2019 0451 Gross per 24 hour  Intake 448.41 ml  Output 400 ml  Net 48.41 ml   Filed Weights   03/16/19 0222 03/16/19 1653  Weight: 75.8 kg 74.1 kg  Exam:  General: NAD, intermittently confused, but alert  Cardiovascular: S1, S2 present  Respiratory:  Rhonchi noted throughout bilaterally  Abdomen: Soft, nontender, nondistended, bowel sounds present  Musculoskeletal: No bilateral pedal edema noted  Skin: Normal  Psychiatry: Normal mood    Data Reviewed: CBC: Recent Labs  Lab 03/28/2019 2221 03/16/19 1115 03/17/19 0129 03/01/2019 0147  WBC  11.9* 12.2* 10.5 11.2*  NEUTROABS 8.1*  --   --  8.8*  HGB 11.4* 11.6* 11.3* 10.8*  HCT 35.1* 35.6* 35.6* 34.0*  MCV 96.2 96.0 96.2 96.6  PLT 181 184 199 [id number]   Basic Metabolic Panel: Recent Labs  Lab 03/08/2019 2221 03/16/19 1211 03/17/19 0129 03/18/19 0127 03/21/2019 0147  NA 138 140 139 140 142  K 4.3 3.9 3.9 3.9 3.8  CL 106 107 109 109 111  CO2 21* 22 19* 20* 20*  GLUCOSE 124* 118* 113* 134* 141*  BUN 36* 28* 26* 28* 26*  CREATININE 2.41* 1.60* 1.33* 1.32* 1.26*  CALCIUM 8.3* 7.8* 8.0* 8.3* 8.1*  MG  --   --  2.0  --   --    GFR: Estimated Creatinine Clearance: 37.4 mL/min (A) (by C-G formula based on SCr of 1.26 mg/dL (H)). Liver Function Tests: Recent Labs  Lab 02/28/2019 2221  AST 21  ALT 17  ALKPHOS 78  BILITOT 1.0  PROT 6.4*  ALBUMIN 2.8*   No results for input(s): LIPASE, AMYLASE in the last 168 hours. No results for input(s): AMMONIA in the last 168 hours. Coagulation Profile: Recent Labs  Lab 03/03/2019 2221  INR 1.4*   Cardiac Enzymes: No results for input(s): CKTOTAL, CKMB, CKMBINDEX, TROPONINI in the last 168 hours. BNP (last 3 results) No results for input(s): PROBNP in the last 8760 hours. HbA1C: No results for input(s): HGBA1C in the last 72 hours. CBG: No results for input(s): GLUCAP in the last 168 hours. Lipid Profile: No results for input(s): CHOL, HDL, LDLCALC, TRIG, CHOLHDL, LDLDIRECT in the last 72 hours. Thyroid Function Tests: No results for input(s): TSH, T4TOTAL, FREET4, T3FREE, THYROIDAB in the last 72 hours. Anemia Panel: No results for input(s): VITAMINB12, FOLATE, FERRITIN, TIBC, IRON, RETICCTPCT in the last 72 hours. Urine analysis:    Component Value Date/Time   COLORURINE YELLOW 03/16/2019 0222   APPEARANCEUR HAZY (A) 03/16/2019 0222   LABSPEC 1.024 03/16/2019 0222   PHURINE 5.0 03/16/2019 0222   GLUCOSEU NEGATIVE 03/16/2019 0222   HGBUR NEGATIVE 03/16/2019 0222   BILIRUBINUR NEGATIVE 03/16/2019 0222   KETONESUR 5 (A)  03/16/2019 0222   PROTEINUR 30 (A) 03/16/2019 0222   NITRITE NEGATIVE 03/16/2019 0222   LEUKOCYTESUR NEGATIVE 03/16/2019 0222   Sepsis Labs: @LABRCNTIP (procalcitonin:4,lacticidven:4)  ) Recent Results (from the past 240 hour(s))  Blood Culture (routine x 2)     Status: None (Preliminary result)   Collection Time: 03/01/2019 10:21 PM   Specimen: BLOOD  Result Value Ref Range Status   Specimen Description   Final    BLOOD BLOOD RIGHT ARM Performed at [facility], [place] [address] [place], [place] [address]    Special Requests   Final    BOTTLES DRAWN AEROBIC AND ANAEROBIC Blood Culture adequate volume Performed at [place] [address] [place], [place] [address]    Culture   Final    NO GROWTH 3 DAYS Performed at [facility], [place] [address] [place],  [address]    Report Status PENDING  Incomplete  Blood Culture (routine x 2)  Status: Abnormal   Collection Time: 03/07/2019 10:26 PM   Specimen: BLOOD  Result Value Ref Range Status   Specimen Description   Final    BLOOD BLOOD LEFT HAND Performed at [place] [address], [place], [place] [address]    Special Requests   Final    BOTTLES DRAWN AEROBIC AND ANAEROBIC Blood Culture adequate volume Performed at [place] [address] [place],  [address]    Culture  Setup Time   Final    ANAEROBIC BOTTLE ONLY GRAM POSITIVE COCCI CRITICAL RESULT CALLED TO, READ BACK BY AND VERIFIED WITH: B GREEN PHARMD 03/17/19 0137 JDW    Culture (A)  Final    STAPHYLOCOCCUS SPECIES (COAGULASE NEGATIVE) THE SIGNIFICANCE OF ISOLATING THIS ORGANISM FROM A SINGLE SET OF BLOOD CULTURES WHEN MULTIPLE SETS ARE DRAWN IS UNCERTAIN. PLEASE NOTIFY THE MICROBIOLOGY DEPARTMENT WITHIN ONE WEEK IF SPECIATION AND SENSITIVITIES ARE REQUIRED. Performed at [facility], [place] [address] [place],  [address]    Report Status 03/02/2019  FINAL  Final  Respiratory Panel by RT PCR (Flu A&B, Covid) - Nasopharyngeal Swab     Status: None   Collection Time: 03/26/2019 11:01 PM   Specimen: Nasopharyngeal Swab  Result Value Ref Range Status   SARS Coronavirus 2 by RT PCR NEGATIVE NEGATIVE Final    Comment: (NOTE) SARS-CoV-2 target nucleic acids are NOT DETECTED. The SARS-CoV-2 RNA is generally detectable in upper respiratoy specimens during the acute phase of infection. The lowest concentration of SARS-CoV-2 viral copies this assay can detect is 131 copies/mL. A negative result does not preclude SARS-Cov-2 infection and should not be used as the sole basis for treatment or other patient management decisions. A negative result may occur with  improper specimen collection/handling, submission of specimen other than nasopharyngeal swab, presence of viral mutation(s) within the areas targeted by this assay, and inadequate number of viral copies (<131 copies/mL). A negative result must be combined with clinical observations, patient history, and epidemiological information. The expected result is Negative. Fact Sheet for Patients:  PinkCheek.be Fact Sheet for Healthcare Providers:  GravelBags.it This test is not yet ap proved or cleared by the [place] FDA and  has been authorized for detection and/or diagnosis of SARS-CoV-2 by FDA under an Emergency Use Authorization (EUA). This EUA will remain  in effect (meaning this test can be used) for the duration of the COVID-19 declaration under Section 564(b)(1) of the Act, 21 U.S.C. section 360bbb-3(b)(1), unless the authorization is terminated or revoked sooner.    Influenza A by PCR NEGATIVE NEGATIVE Final   Influenza B by PCR NEGATIVE NEGATIVE Final    Comment: (NOTE) The Xpert Xpress SARS-CoV-2/FLU/RSV assay is intended as an aid in  the diagnosis of influenza from Nasopharyngeal swab specimens and  should not be used  as a sole basis for treatment. Nasal washings and  aspirates are unacceptable for Xpert Xpress SARS-CoV-2/FLU/RSV  testing. Fact Sheet for Patients: PinkCheek.be Fact Sheet for Healthcare Providers: GravelBags.it This test is not yet approved or cleared by the [place] FDA and  has been authorized for detection and/or diagnosis of SARS-CoV-2 by  FDA under an Emergency Use Authorization (EUA). This EUA will remain  in effect (meaning this test can be used) for the duration of the  Covid-19 declaration under Section 564(b)(1) of the Act, 21  U.S.C. section 360bbb-3(b)(1), unless the authorization is  terminated or revoked. Performed at [facility], [place]  [address] [place], [place] [address]   Urine culture     Status: Abnormal   Collection Time: 03/16/19  2:22 AM   Specimen: In/Out Cath Urine  Result Value Ref Range Status   Specimen Description   Final    IN/OUT CATH URINE Performed at [place] [address] [place], [place] [address]    Special Requests   Final    NONE Performed at [facility], [place] [address] [place], [place] [address]    Culture 2,000 COLONIES/mL STAPHYLOCOCCUS WARNERI (A)  Final   Report Status 03/17/2019 FINAL  Final   Organism ID, Bacteria STAPHYLOCOCCUS WARNERI (A)  Final      Susceptibility   Staphylococcus warneri - MIC*    CIPROFLOXACIN <=0.5 SENSITIVE Sensitive     GENTAMICIN <=0.5 SENSITIVE Sensitive     NITROFURANTOIN 32 SENSITIVE Sensitive     OXACILLIN <=0.25 SENSITIVE Sensitive     TETRACYCLINE >=16 RESISTANT Resistant     VANCOMYCIN <=0.5 SENSITIVE Sensitive     TRIMETH/SULFA <=10 SENSITIVE Sensitive     CLINDAMYCIN RESISTANT Resistant     RIFAMPIN <=0.5 SENSITIVE Sensitive     Inducible Clindamycin POSITIVE Resistant     * 2,000 COLONIES/mL STAPHYLOCOCCUS WARNERI  MRSA PCR Screening     Status: Abnormal    Collection Time: 03/16/19  4:53 PM   Specimen: Nasopharyngeal  Result Value Ref Range Status   MRSA by PCR POSITIVE (A) NEGATIVE Final    Comment:        The GeneXpert MRSA Assay (FDA approved for NASAL specimens only), is one component of a comprehensive MRSA colonization surveillance program. It is not intended to diagnose MRSA infection nor to guide or monitor treatment for MRSA infections. RESULT CALLED TO, READ BACK BY AND VERIFIED WITH: GARLAND,G @ 2020 ON [id number] BY POTEAT,S Performed at [facility], [place] [address], [place], [place] [address]   Culture, blood (routine x 2) Call MD if unable to obtain prior to antibiotics being given     Status: None (Preliminary result)   Collection Time: 03/16/19  5:59 PM   Specimen: BLOOD RIGHT ARM  Result Value Ref Range Status   Specimen Description   Final    BLOOD RIGHT ARM Performed at [facility] [address], [place], [place] [address]    Special Requests   Final    BOTTLES DRAWN AEROBIC ONLY Blood Culture adequate volume Performed at [place] [address], [place], [place] [address]    Culture   Final    NO GROWTH 3 DAYS Performed at [facility], [place] [address], [place], [place] [address]    Report Status PENDING  Incomplete  Culture, blood (routine x 2) Call MD if unable to obtain prior to antibiotics being given     Status: None (Preliminary result)   Collection Time: 03/16/19  5:59 PM   Specimen: BLOOD  Result Value Ref Range Status   Specimen Description   Final    BLOOD RIGHT ANTECUBITAL Performed at [place] [address] [place], [place] [address]    Special Requests   Final    BOTTLES DRAWN AEROBIC AND ANAEROBIC Blood Culture adequate volume Performed at [place] [address], [place], [place] [address]    Culture   Final    NO GROWTH 3 DAYS Performed at [facility], [place] [address], [place], [place] [address]  Report Status PENDING  Incomplete  SARS CORONAVIRUS 2 (TAT 6-24 HRS) Nasopharyngeal Nasopharyngeal Swab     Status: None   Collection Time: 03/17/19  6:30 PM   Specimen: Nasopharyngeal Swab  Result Value Ref Range Status   SARS Coronavirus 2 NEGATIVE NEGATIVE Final    Comment: (NOTE) SARS-CoV-2 target nucleic acids are NOT DETECTED. The SARS-CoV-2 RNA is generally detectable in upper and lower respiratory specimens during the acute phase of infection. Negative results do not preclude SARS-CoV-2 infection, do not rule out co-infections with other pathogens, and should not be used as the sole basis for treatment or other patient management decisions. Negative results must be combined with clinical observations, patient history, and epidemiological information. The expected result is Negative. Fact Sheet for Patients: SugarRoll.be Fact Sheet for Healthcare Providers: https://www.woods-mathews.com/ This test is not yet approved or cleared by the [place] FDA and  has been authorized for detection and/or diagnosis of SARS-CoV-2 by FDA under an Emergency Use Authorization (EUA). This EUA will remain  in effect (meaning this test can be used) for the duration of the COVID-19 declaration under Section 56 4(b)(1) of the Act, 21 U.S.C. section 360bbb-3(b)(1), unless the authorization is terminated or revoked sooner. Performed at [facility], [place] [address] [place], [place] [address]       Studies: DG Swallowing Func-Speech Pathology  Result Date: 03/22/2019 Objective Swallowing Evaluation: Type of Study: MBS-Modified Barium Swallow Study  Patient Details Name: Stanley Hunter MRN: [id number] Date of Birth: November 27, 1924 Today's Date: 02/28/2019 Time: SLP Start Time (ACUTE ONLY): 0836 -SLP Stop Time (ACUTE ONLY): 0855 SLP Time Calculation (min) (ACUTE ONLY): 19 min Past Medical History: Past Medical History:  Diagnosis Date . Acid reflux 08/03/2015 . Anxiety 08/03/2015 . Avitaminosis D 05/18/2012 . Benign essential HTN 02/09/2009  Overview:  Benign Essential Hypertension  . Benign prostatic hyperplasia with urinary obstruction 12/07/2013 . Chronic kidney disease (CKD), stage III (moderate) 11/04/2011 . CN (constipation) 08/03/2015 . Cognitive changes 08/07/2015 . Degenerative arthritis of hip 05/14/2013 . Degenerative arthritis of spine 05/14/2013 . Degenerative disorder of muscle 08/03/2015 . Dysrhythmia 2001  ablation for a-flutter . H/O atrial flutter 08/03/2015  Overview:  Pacemaker placed 2015  . High blood pressure  . High cholesterol  . History of pacemaker 2016 . Hx of cardiac cath 2013 . Loss of balance 08/07/2015 . Lumbar canal stenosis 07/07/2013  Overview:  Lumbar laminectomy 07/07/13 - Dr. Lurene Shadow  . Lumbar scoliosis 05/14/2013 . Macular degeneration 08/07/2015 . Premature contractions, supraventricular 12/15/2008  Overview:  Atrial Premature Complex  . Presence of permanent cardiac pacemaker  . Tremor 08/07/2015  hands . Urinary frequency 08/07/2015 Past Surgical History: Past Surgical History: Procedure Laterality Date . ATRIAL FLUTTER ABLATION  2001 . CATARACT EXTRACTION Bilateral 2015 . CHOLECYSTECTOMY   . COLONOSCOPY   . MASTOIDECTOMY  1934 . NASAL SEPTOPLASTY W/ TURBINOPLASTY Bilateral 10/14/2018  Procedure: NASAL SEPTOPLASTY WITH BILATERAL  TURBINATE REDUCTION;  Surgeon: Leta Baptist, MD;  Location: [facility];  Service: ENT;  Laterality: Bilateral; . PACEMAKER PLACEMENT  2016 . SPINE SURGERY  2016  LUMBER- lower . TONSILLECTOMY  1934 . TRIGGER FINGER RELEASE Left 12/14/2015  Procedure: RELEASE TRIGGER FINGER/A-1 PULLEY ring finger left;  Surgeon: Daryll Brod, MD;  Location: [facility];  Service: Orthopedics;  Laterality: Left;  FAB HPI: 86 yo male adm [facility] 2 days previously with difficulty breathing and AMS. Pt found to have pna - CXR today showing progression which is concerning.  Swallow eval ordered.  Pt with PMH  + for history significant of coronary artery disease status post. Cardiac catheterization, chronic kidney disease stage III, essential hypertension, degenerative disc joint disease, sinusitis s/p septum surgery in 2020, "Memory difficulty" per Dr Bubba Camp in snapshot, history of pacemaker placement 2015, GERD, who had his Covid vaccine 2 weeks prior to admission, Chest x-ray showed lobar pneumonia.  Concern for aspiration present and swallow eval ordered.  Subjective: pt awake in chair, reports his breathing is better Assessment / Plan / Recommendation CHL IP CLINICAL IMPRESSIONS 03/20/2019 Clinical Impression Patient presents with mild pharyngoesophageal dysphagia with decreased CP/UES relaxation/opening resulting in minimal backflow of barium to distal pharynx.  No aspiration or penetration observed and swallow was timely and strong.  Pt does appear with anterior protrusion of cervical spine at C3-C4 into pharynx which at times diminished epiglottic deflection.   Pt consumed thin via straw sequentially with adequate protection of airway.  Adequate laryngeal elevation/closure observed.  Of note, pt reported sensation of residuals in pharynx x1 after cracker bolus swallow.   Upon esophageal sweep after liquid consumption, he appeared with some retrograde propulsion of thin - ? due to dysmotility.  Recommend continue diet as tolerated being mindful of pt's work of breathing due to risk of impairing reciprocity of swallow/breathing. SLP to follow briefly for esophageal/respiratory precautions.  Thanks for this consult. SLP Visit Diagnosis Dysphagia, pharyngoesophageal phase (R13.14) Attention and concentration deficit following -- Frontal lobe and executive function deficit following -- Impact on safety and function Mild aspiration risk   CHL IP TREATMENT RECOMMENDATION 02/27/2019 Treatment Recommendations Therapy as outlined in treatment plan below   Prognosis 03/08/2019 Prognosis for Safe Diet Advancement Fair Barriers  to Reach Goals Other (Comment) Barriers/Prognosis Comment -- CHL IP DIET RECOMMENDATION 03/02/2019 SLP Diet Recommendations (No Data) Liquid Administration via -- Medication Administration (No Data) Compensations (No Data) Postural Changes Remain semi-upright after after feeds/meals (Comment);Seated upright at 90 degrees   CHL IP OTHER RECOMMENDATIONS 03/28/2019 Recommended Consults -- Oral Care Recommendations Oral care before and after PO Other Recommendations Have oral suction available   CHL IP FOLLOW UP RECOMMENDATIONS 03/25/2019 Follow up Recommendations None   CHL IP FREQUENCY AND DURATION 03/17/2019 Speech Therapy Frequency (ACUTE ONLY) min 2x/week Treatment Duration 2 weeks      CHL IP ORAL PHASE 03/16/2019 Oral Phase Impaired Oral - Pudding Teaspoon -- Oral - Pudding Cup -- Oral - Honey Teaspoon -- Oral - Honey Cup -- Oral - Nectar Teaspoon NT Oral - Nectar Cup -- Oral - Nectar Straw WFL Oral - Thin Teaspoon WFL Oral - Thin Cup -- Oral - Thin Straw WFL;Piecemeal swallowing;Other (Comment) Oral - Puree WFL Oral - Mech Soft -- Oral - Regular WFL Oral - Multi-Consistency -- Oral - Pill -- Oral Phase - Comment --  CHL IP PHARYNGEAL PHASE 02/27/2019 Pharyngeal Phase Impaired Pharyngeal- Pudding Teaspoon -- Pharyngeal -- Pharyngeal- Pudding Cup -- Pharyngeal -- Pharyngeal- Honey Teaspoon -- Pharyngeal -- Pharyngeal- Honey Cup -- Pharyngeal -- Pharyngeal- Nectar Teaspoon -- Pharyngeal -- Pharyngeal- Nectar Cup -- Pharyngeal -- Pharyngeal- Nectar Straw WFL Pharyngeal Material does not enter airway Pharyngeal- Thin Teaspoon WFL Pharyngeal Material does not enter airway Pharyngeal- Thin Cup -- Pharyngeal -- Pharyngeal- Thin Straw WFL Pharyngeal Material does not enter airway Pharyngeal- Puree WFL Pharyngeal Material does not enter airway Pharyngeal- Mechanical Soft -- Pharyngeal -- Pharyngeal- Regular WFL Pharyngeal Material does not enter airway Pharyngeal- Multi-consistency -- Pharyngeal -- Pharyngeal- Pill --  Pharyngeal -- Pharyngeal Comment --  CHL IP CERVICAL ESOPHAGEAL  PHASE 03/11/2019 Cervical Esophageal Phase Impaired Pudding Teaspoon -- Pudding Cup -- Honey Teaspoon -- Honey Cup -- Nectar Teaspoon -- Nectar Cup -- Nectar Straw Reduced cricopharyngeal relaxation;Esophageal backflow into the pharynx;Prominent cricopharyngeal segment Thin Teaspoon Reduced cricopharyngeal relaxation;Esophageal backflow into the pharynx;Prominent cricopharyngeal segment Thin Cup -- Thin Straw Reduced cricopharyngeal relaxation;Esophageal backflow into the pharynx;Prominent cricopharyngeal segment Puree Reduced cricopharyngeal relaxation;Esophageal backflow into the pharynx;Prominent cricopharyngeal segment Mechanical Soft -- Regular Reduced cricopharyngeal relaxation;Esophageal backflow into the pharynx;Prominent cricopharyngeal segment Multi-consistency -- Pill -- Cervical Esophageal Comment -- Kathleen Lime, MS [facility] SLP Acute Rehab Services Office [phone] Macario Golds 03/16/2019, 9:54 AM               Scheduled Meds: . apixaban  5 mg Oral BID  . atorvastatin  20 mg Oral q1800  . azithromycin  500 mg Oral Daily  . Chlorhexidine Gluconate Cloth  6 each Topical Daily  . dextromethorphan-guaiFENesin  1 tablet Oral BID  . diltiazem  360 mg Oral Daily  . levalbuterol  0.63 mg Nebulization Q6H  . mouth rinse  15 mL Mouth Rinse BID  . mupirocin ointment  1 application Nasal BID  . pantoprazole  20 mg Oral Daily  . propafenone  225 mg Oral Q6H  . sodium chloride (PF)      . tamsulosin  0.4 mg Oral QHS    Continuous Infusions: . ampicillin-sulbactam (UNASYN) IV Stopped (03/22/2019 0935)  . [START ON 03/20/2019] vancomycin       LOS: 3 days     Alma Friendly, MD [facility]  If 7PM-7AM, please contact night-coverage [email or web address] 02/28/2019, 3:43 PM

## 2019-03-20 ENCOUNTER — Inpatient Hospital Stay (HOSPITAL_COMMUNITY): Payer: Medicare Other

## 2019-03-20 DIAGNOSIS — Z515 Encounter for palliative care: Secondary | ICD-10-CM

## 2019-03-20 DIAGNOSIS — I480 Paroxysmal atrial fibrillation: Secondary | ICD-10-CM

## 2019-03-20 LAB — BASIC METABOLIC PANEL
Anion gap: 11 (ref 5–15)
BUN: 30 mg/dL — ABNORMAL HIGH (ref 8–23)
CO2: 20 mmol/L — ABNORMAL LOW (ref 22–32)
Calcium: 8.1 mg/dL — ABNORMAL LOW (ref 8.9–10.3)
Chloride: 111 mmol/L (ref 98–111)
Creatinine, Ser: 1.55 mg/dL — ABNORMAL HIGH (ref 0.61–1.24)
GFR calc Af Amer: 44 mL/min — ABNORMAL LOW (ref >60)
GFR calc non Af Amer: 38 mL/min — ABNORMAL LOW (ref >60)
Glucose, Bld: 129 mg/dL — ABNORMAL HIGH (ref 70–99)
Potassium: 3.7 mmol/L (ref 3.5–5.1)
Sodium: 142 mmol/L (ref 135–145)

## 2019-03-20 LAB — BODY FLUID CELL COUNT WITH DIFFERENTIAL
Eos, Fluid: 1 %
Lymphs, Fluid: 16 %
Monocyte-Macrophage-Serous Fluid: 5 % — ABNORMAL LOW (ref 50–90)
Neutrophil Count, Fluid: 78 % — ABNORMAL HIGH (ref 0–25)
Total Nucleated Cell Count, Fluid: 1054 cu mm — ABNORMAL HIGH (ref 0–1000)

## 2019-03-20 LAB — LACTATE DEHYDROGENASE, PLEURAL OR PERITONEAL FLUID: LD, Fluid: 102 U/L — ABNORMAL HIGH (ref 3–23)

## 2019-03-20 LAB — CBC WITH DIFFERENTIAL/PLATELET
Abs Immature Granulocytes: 0.11 10*3/uL — ABNORMAL HIGH (ref 0.00–0.07)
Basophils Absolute: 0 10*3/uL (ref 0.0–0.1)
Basophils Relative: 0 %
Eosinophils Absolute: 0.1 10*3/uL (ref 0.0–0.5)
Eosinophils Relative: 1 %
HCT: 32.8 % — ABNORMAL LOW (ref 39.0–52.0)
Hemoglobin: 10.5 g/dL — ABNORMAL LOW (ref 13.0–17.0)
Immature Granulocytes: 1 %
Lymphocytes Relative: 6 %
Lymphs Abs: 0.7 10*3/uL (ref 0.7–4.0)
MCH: 30.7 pg (ref 26.0–34.0)
MCHC: 32 g/dL (ref 30.0–36.0)
MCV: 95.9 fL (ref 80.0–100.0)
Monocytes Absolute: 1.4 10*3/uL — ABNORMAL HIGH (ref 0.1–1.0)
Monocytes Relative: 12 %
Neutro Abs: 9.6 10*3/uL — ABNORMAL HIGH (ref 1.7–7.7)
Neutrophils Relative %: 80 %
Platelets: 225 10*3/uL (ref 150–400)
RBC: 3.42 MIL/uL — ABNORMAL LOW (ref 4.22–5.81)
RDW: 15 % (ref 11.5–15.5)
WBC: 12 10*3/uL — ABNORMAL HIGH (ref 4.0–10.5)
nRBC: 0.2 % (ref 0.0–0.2)

## 2019-03-20 LAB — PROTEIN, PLEURAL OR PERITONEAL FLUID: Total protein, fluid: <3 g/dL

## 2019-03-20 LAB — BLOOD GAS, ARTERIAL
Acid-base deficit: 0.5 mmol/L (ref 0.0–2.0)
Bicarbonate: 22.4 mmol/L (ref 20.0–28.0)
O2 Content: 15 L/min
O2 Saturation: 87 %
Patient temperature: 98.6
pCO2 arterial: 32.4 mmHg (ref 32.0–48.0)
pH, Arterial: 7.454 — ABNORMAL HIGH (ref 7.350–7.450)
pO2, Arterial: 54.5 mmHg — ABNORMAL LOW (ref 83.0–108.0)

## 2019-03-20 LAB — GLUCOSE, PLEURAL OR PERITONEAL FLUID: Glucose, Fluid: 130 mg/dL

## 2019-03-20 MED ORDER — FUROSEMIDE 10 MG/ML IJ SOLN
40.0000 mg | Freq: Once | INTRAMUSCULAR | Status: AC
  Administered 2019-03-20: 40 mg via INTRAVENOUS
  Filled 2019-03-20: qty 4

## 2019-03-20 MED ORDER — APIXABAN 5 MG PO TABS
5.0000 mg | ORAL_TABLET | Freq: Two times a day (BID) | ORAL | Status: DC
  Administered 2019-03-21: 10:00:00 5 mg via ORAL
  Filled 2019-03-20: qty 1

## 2019-03-20 MED ORDER — SODIUM CHLORIDE 0.9 % IV SOLN
2.0000 g | Freq: Two times a day (BID) | INTRAVENOUS | Status: DC
  Administered 2019-03-20 – 2019-03-22 (×4): 2 g via INTRAVENOUS
  Filled 2019-03-20 (×4): qty 2

## 2019-03-20 MED ORDER — HALOPERIDOL LACTATE 5 MG/ML IJ SOLN
2.5000 mg | Freq: Once | INTRAMUSCULAR | Status: AC
  Administered 2019-03-20: 2.5 mg via INTRAVENOUS
  Filled 2019-03-20: qty 1

## 2019-03-20 MED ORDER — LIDOCAINE HCL 1 % IJ SOLN
INTRAMUSCULAR | Status: AC
  Filled 2019-03-20: qty 20

## 2019-03-20 NOTE — Progress Notes (Signed)
OT Cancellation Note  Patient Details Name: Stanley Hunter MRN: [id number] DOB: 06-02-1924   Cancelled Treatment:    Reason Eval/Treat Not Completed: Patient not medically ready. Eval deferred at request of RN - pt on 15L HFNC and non-re-breather and with possible Thoracentesis this date.  Will continue to follow for appropriateness of therapy.  [place] 03/20/2019, 9:51 AM   Jesse Sans OTR/L Acute Rehabilitation Services Pager: [phone] Office: [phone]

## 2019-03-20 NOTE — Progress Notes (Addendum)
Palliative Medicine    Name: Stanley Hunter Date: 03/20/2019 MRN: [id number]  DOB: 09/08/1924  Patient Care Team: Mast, Man X, NP as PCP - General (Internal Medicine) Mast, Man X, NP as Nurse Practitioner (Internal Medicine)    REASON FOR CONSULTATION: Stanley Hunter is a 86 y.o. male with multiple medical problems including coronary artery disease status post. Cardiac catheterization, chronic kidney disease stage III, essential hypertension, degenerative disc joint disease, history of pacemaker placement 2015, who had his Covid vaccine2 weeks agoand presents with shortness of breath, cough and fever. Patient also has lower extremity edema. He was found to be hypoxic. Chest x-ray showed lobar pneumonia..   SOCIAL HISTORY:     reports that he quit smoking about 43 years ago. His smoking use included cigarettes. He quit after 50.00 years of use. He has never used smokeless tobacco. He reports current alcohol use of about 3.0 standard drinks of alcohol per week. He reports that he does not use drugs.   Patient is married to his wife of 22 years.  They live together in a cottage at [facility].  They have a daughter in [place], another daughter in [place], and a son in [place].  Patient is originally from [place] and moved here for retirement.  He worked as a Radiation protection practitioner.  ADVANCE DIRECTIVES:  On file dated 11/20/2015  CODE STATUS: DNR  PAST MEDICAL HISTORY: Past Medical History:  Diagnosis Date  . Acid reflux 08/03/2015  . Anxiety 08/03/2015  . Avitaminosis D 05/18/2012  . Benign essential HTN 02/09/2009   Overview:  Benign Essential Hypertension   . Benign prostatic hyperplasia with urinary obstruction 12/07/2013  . Chronic kidney disease (CKD), stage III (moderate) 11/04/2011  . CN (constipation) 08/03/2015  . Cognitive changes 08/07/2015  . Degenerative arthritis of hip 05/14/2013  . Degenerative arthritis of spine 05/14/2013  . Degenerative disorder of muscle  08/03/2015  . Dysrhythmia 2001   ablation for a-flutter  . H/O atrial flutter 08/03/2015   Overview:  Pacemaker placed 2015   . High blood pressure   . High cholesterol   . History of pacemaker 2016  . Hx of cardiac cath 2013  . Loss of balance 08/07/2015  . Lumbar canal stenosis 07/07/2013   Overview:  Lumbar laminectomy 07/07/13 - Dr. Lurene Shadow   . Lumbar scoliosis 05/14/2013  . Macular degeneration 08/07/2015  . Premature contractions, supraventricular 12/15/2008   Overview:  Atrial Premature Complex   . Presence of permanent cardiac pacemaker   . Tremor 08/07/2015   hands  . Urinary frequency 08/07/2015    PAST SURGICAL HISTORY:  Past Surgical History:  Procedure Laterality Date  . ATRIAL FLUTTER ABLATION  2001  . CATARACT EXTRACTION Bilateral 2015  . CHOLECYSTECTOMY    . COLONOSCOPY    . MASTOIDECTOMY  1934  . NASAL SEPTOPLASTY W/ TURBINOPLASTY Bilateral 10/14/2018   Procedure: NASAL SEPTOPLASTY WITH BILATERAL  TURBINATE REDUCTION;  Surgeon: Leta Baptist, MD;  Location: [place];  Service: ENT;  Laterality: Bilateral;  . PACEMAKER PLACEMENT  2016  . SPINE SURGERY  2016   LUMBER- lower  . TONSILLECTOMY  1934  . TRIGGER FINGER RELEASE Left 12/14/2015   Procedure: RELEASE TRIGGER FINGER/A-1 PULLEY ring finger left;  Surgeon: Daryll Brod, MD;  Location: [place];  Service: Orthopedics;  Laterality: Left;  FAB    HEMATOLOGY/ONCOLOGY HISTORY:  Oncology History   No history exists.    ALLERGIES:  is allergic to cortisone; neomycin; and  sulfamethoxazole-trimethoprim.  MEDICATIONS:  Current Facility-Administered Medications  Medication Dose Route Frequency Provider Last Rate Last Admin  . ALPRAZolam Duanne Moron) tablet 0.5 mg  0.5 mg Oral QHS PRN Rai, Ripudeep K, MD   0.5 mg at 03/15/2019 1744  . Ampicillin-Sulbactam (UNASYN) 3 g in sodium chloride 0.9 % 100 mL IVPB  3 g Intravenous Q8H Green, Terri L, RPH 200 mL/hr at 03/20/19 0842 3 g at 03/20/19 0842  . apixaban (ELIQUIS)  tablet 5 mg  5 mg Oral BID Eudelia Bunch, RPH   5 mg at 03/20/19 0848  . atorvastatin (LIPITOR) tablet 20 mg  20 mg Oral q1800 Rai, Ripudeep K, MD   20 mg at 03/27/2019 1630  . azithromycin (ZITHROMAX) tablet 500 mg  500 mg Oral Daily Leodis Sias T, RPH   500 mg at 03/20/19 0846  . Chlorhexidine Gluconate Cloth 2 % PADS 6 each  6 each Topical Daily Rai, Ripudeep K, MD   6 each at 03/16/2019 1100  . dextromethorphan-guaiFENesin (MUCINEX DM) 30-600 MG per 12 hr tablet 1 tablet  1 tablet Oral BID Alma Friendly, MD   1 tablet at 03/20/19 0848  . diltiazem (CARDIZEM CD) 24 hr capsule 360 mg  360 mg Oral Daily Pixie Casino, MD   360 mg at 03/20/19 0847  . levalbuterol (XOPENEX) nebulizer solution 0.63 mg  0.63 mg Nebulization Q2H PRN Alma Friendly, MD      . levalbuterol [facility]) nebulizer solution 0.63 mg  0.63 mg Nebulization Q6H Alma Friendly, MD   0.63 mg at 03/20/19 [id number]  . MEDLINE mouth rinse  15 mL Mouth Rinse BID Alma Friendly, MD   15 mL at 03/17/2019 2228  . mupirocin ointment (BACTROBAN) 2 % 1 application  1 application Nasal BID Rai, Vernelle Emerald, MD   1 application at [id number]  . pantoprazole (PROTONIX) EC tablet 20 mg  20 mg Oral Daily Rai, Ripudeep K, MD   20 mg at 03/13/2019 1014  . propafenone (RYTHMOL) tablet 225 mg  225 mg Oral Q6H Rai, Ripudeep K, MD   225 mg at 03/20/19 0521  . tamsulosin (FLOMAX) capsule 0.4 mg  0.4 mg Oral QHS Rai, Ripudeep K, MD   0.4 mg at 03/20/2019 2249  . vancomycin (VANCOREADY) IVPB 1500 mg/300 mL  1,500 mg Intravenous Q48H Adrian Saran, [facility]        VITAL SIGNS: BP (!) 160/49   Pulse 64   Temp (!) 97.5 F (36.4 C) (Axillary)   Resp (!) 28   Ht 5\' 9"  (1.753 m)   Wt 163 lb 5.8 oz (74.1 kg)   SpO2 94%   BMI 24.12 kg/m  Filed Weights   03/16/19 0222 03/16/19 1653  Weight: 167 lb (75.8 kg) 163 lb 5.8 oz (74.1 kg)    Estimated body mass index is 24.12 kg/m as calculated from the following:   Height as of this  encounter: 5\' 9"  (1.753 m).   Weight as of this encounter: 163 lb 5.8 oz (74.1 kg).  LABS: CBC:    Component Value Date/Time   WBC 12.0 (H) 03/20/2019 0211   HGB 10.5 (L) 03/20/2019 0211   HCT 32.8 (L) 03/20/2019 0211   PLT 225 03/20/2019 0211   MCV 95.9 03/20/2019 0211   NEUTROABS 9.6 (H) 03/20/2019 0211   LYMPHSABS 0.7 03/20/2019 0211   MONOABS 1.4 (H) 03/20/2019 0211   EOSABS 0.1 03/20/2019 0211   BASOSABS 0.0 03/20/2019 0211   Comprehensive Metabolic Panel:  Component Value Date/Time   NA 142 03/20/2019 0211   NA 141 01/23/2016 0000   K 3.7 03/20/2019 0211   CL 111 03/20/2019 0211   CO2 20 (L) 03/20/2019 0211   BUN 30 (H) 03/20/2019 0211   BUN 29 (A) 01/23/2016 0000   CREATININE 1.55 (H) 03/20/2019 0211   CREATININE 1.41 (H) 09/24/2018 0655   GLUCOSE 129 (H) 03/20/2019 0211   CALCIUM 8.1 (L) 03/20/2019 0211   AST 21 03/09/2019 2221   ALT 17 03/14/2019 2221   ALKPHOS 78 03/05/2019 2221   BILITOT 1.0 03/10/2019 2221   PROT 6.4 (L) 03/20/2019 2221   ALBUMIN 2.8 (L) 03/22/2019 2221    RADIOGRAPHIC STUDIES: CT ANGIO CHEST PE W OR WO CONTRAST  Result Date: 03/13/2019 CLINICAL DATA:  Shortness of breath. EXAM: CT ANGIOGRAPHY CHEST WITH CONTRAST TECHNIQUE: Multidetector CT imaging of the chest was performed using the standard protocol during bolus administration of intravenous contrast. Multiplanar CT image reconstructions and MIPs were obtained to evaluate the vascular anatomy. CONTRAST:  145mL OMNIPAQUE IOHEXOL 350 MG/ML SOLN COMPARISON:  None. FINDINGS: Cardiovascular: Satisfactory opacification of the pulmonary arteries to the segmental level. No evidence of pulmonary embolism. Mild cardiomegaly is noted. Atherosclerosis of thoracic aorta is noted without aneurysm or dissection. No pericardial effusion. Mediastinum/Nodes: Large sliding-type hiatal hernia is noted. No adenopathy is noted. Thyroid gland is unremarkable. Lungs/Pleura: Large right pleural effusion is noted  with complete atelectasis of the right lower lobe and significant atelectasis involving the right upper lobe. Moderate left pleural effusion is noted with adjacent subsegmental atelectasis. No pneumothorax is noted. Left upper lobe airspace opacity is noted concerning for pneumonia. Upper Abdomen: No acute abnormality. Musculoskeletal: No chest wall abnormality. No acute or significant osseous findings. Review of the MIP images confirms the above findings. IMPRESSION: 1. No definite evidence of pulmonary embolus. 2. Large right pleural effusion is noted with complete atelectasis of the right lower lobe and significant atelectasis involving the right upper lobe. 3. Moderate left pleural effusion is noted with adjacent subsegmental atelectasis. 4. Left upper lobe airspace opacity is noted concerning for pneumonia. 5. Large sliding-type hiatal hernia is noted. Aortic Atherosclerosis (ICD10-I70.0). Electronically Signed   By: Marijo Conception M.D.   On: 03/18/2019 15:48   DG Chest Port 1 View  Result Date: 03/17/2019 CLINICAL DATA:  History of COVID-19 positivity with cough and fever EXAM: PORTABLE CHEST 1 VIEW COMPARISON:  03/22/2019 FINDINGS: Increasing right basilar infiltrate is noted with associated effusion. Cardiomegaly is again noted. Pacing device is stable. Left lung is clear. No bony abnormality is noted. IMPRESSION: Increasing right basilar infiltrate and effusion. Electronically Signed   By: Inez Catalina M.D.   On: 03/17/2019 14:26   DG Chest Port 1 View  Result Date: 03/20/2019 CLINICAL DATA:  Fever and shortness of breath. EXAM: PORTABLE CHEST 1 VIEW COMPARISON:  In April 21, 2016 FINDINGS: There is a dual lead AICD. Mild infiltrate is seen along the medial aspect of the right lung base. There is elevation of the right hemidiaphragm. No pleural effusion or pneumothorax is identified. The cardiac silhouette is moderately enlarged. Marked severity calcification of the aortic arch is seen.  Multilevel degenerative changes are noted throughout the thoracic spine. IMPRESSION: 1. Mild right basilar infiltrate. 2. Cardiomegaly. Electronically Signed   By: Virgina Norfolk M.D.   On: 03/22/2019 22:46   DG Swallowing Func-Speech Pathology  Result Date: 03/02/2019 Objective Swallowing Evaluation: Type of Study: MBS-Modified Barium Swallow Study  Patient Details Name: Stanley  Hunter MRN: [id number] Date of Birth: February 25, 1925 Today's Date: 03/12/2019 Time: SLP Start Time (ACUTE ONLY): [id number] -SLP Stop Time (ACUTE ONLY): 0855 SLP Time Calculation (min) (ACUTE ONLY): 19 min Past Medical History: Past Medical History: Diagnosis Date . Acid reflux 08/03/2015 . Anxiety 08/03/2015 . Avitaminosis D 05/18/2012 . Benign essential HTN 02/09/2009  Overview:  Benign Essential Hypertension  . Benign prostatic hyperplasia with urinary obstruction 12/07/2013 . Chronic kidney disease (CKD), stage III (moderate) 11/04/2011 . CN (constipation) 08/03/2015 . Cognitive changes 08/07/2015 . Degenerative arthritis of hip 05/14/2013 . Degenerative arthritis of spine 05/14/2013 . Degenerative disorder of muscle 08/03/2015 . Dysrhythmia 2001  ablation for a-flutter . H/O atrial flutter 08/03/2015  Overview:  Pacemaker placed 2015  . High blood pressure  . High cholesterol  . History of pacemaker 2016 . Hx of cardiac cath 2013 . Loss of balance 08/07/2015 . Lumbar canal stenosis 07/07/2013  Overview:  Lumbar laminectomy 07/07/13 - Dr. Lurene Shadow  . Lumbar scoliosis 05/14/2013 . Macular degeneration 08/07/2015 . Premature contractions, supraventricular 12/15/2008  Overview:  Atrial Premature Complex  . Presence of permanent cardiac pacemaker  . Tremor 08/07/2015  hands . Urinary frequency 08/07/2015 Past Surgical History: Past Surgical History: Procedure Laterality Date . ATRIAL FLUTTER ABLATION  2001 . CATARACT EXTRACTION Bilateral 2015 . CHOLECYSTECTOMY   . COLONOSCOPY   . MASTOIDECTOMY  1934 . NASAL SEPTOPLASTY W/ TURBINOPLASTY Bilateral 10/14/2018  Procedure:  NASAL SEPTOPLASTY WITH BILATERAL  TURBINATE REDUCTION;  Surgeon: Leta Baptist, MD;  Location: [place];  Service: ENT;  Laterality: Bilateral; . PACEMAKER PLACEMENT  2016 . SPINE SURGERY  2016  LUMBER- lower . TONSILLECTOMY  1934 . TRIGGER FINGER RELEASE Left 12/14/2015  Procedure: RELEASE TRIGGER FINGER/A-1 PULLEY ring finger left;  Surgeon: Daryll Brod, MD;  Location: [place];  Service: Orthopedics;  Laterality: Left;  FAB HPI: 86 yo male adm to [facility] 2 days previously with difficulty breathing and AMS. Pt found to have pna - CXR today showing progression which is concerning.  Swallow eval ordered.  Pt with PMH + for history significant of coronary artery disease status post. Cardiac catheterization, chronic kidney disease stage III, essential hypertension, degenerative disc joint disease, sinusitis s/p septum surgery in 2020, "Memory difficulty" per Dr Bubba Camp in snapshot, history of pacemaker placement 2015, GERD, who had his Covid vaccine 2 weeks prior to admission, Chest x-ray showed lobar pneumonia.  Concern for aspiration present and swallow eval ordered.  Subjective: pt awake in chair, reports his breathing is better Assessment / Plan / Recommendation CHL IP CLINICAL IMPRESSIONS 03/08/2019 Clinical Impression Patient presents with mild pharyngoesophageal dysphagia with decreased CP/UES relaxation/opening resulting in minimal backflow of barium to distal pharynx.  No aspiration or penetration observed and swallow was timely and strong.  Pt does appear with anterior protrusion of cervical spine at C3-C4 into pharynx which at times diminished epiglottic deflection.   Pt consumed thin via straw sequentially with adequate protection of airway.  Adequate laryngeal elevation/closure observed.  Of note, pt reported sensation of residuals in pharynx x1 after cracker bolus swallow.   Upon esophageal sweep after liquid consumption, he appeared with some retrograde propulsion of thin - ? due to dysmotility.   Recommend continue diet as tolerated being mindful of pt's work of breathing due to risk of impairing reciprocity of swallow/breathing. SLP to follow briefly for esophageal/respiratory precautions.  Thanks for this consult. SLP Visit Diagnosis Dysphagia, pharyngoesophageal phase (R13.14) Attention and concentration deficit following -- Frontal lobe and executive function deficit following --  Impact on safety and function Mild aspiration risk   CHL IP TREATMENT RECOMMENDATION 03/20/2019 Treatment Recommendations Therapy as outlined in treatment plan below   Prognosis 03/01/2019 Prognosis for Safe Diet Advancement Fair Barriers to Reach Goals Other (Comment) Barriers/Prognosis Comment -- CHL IP DIET RECOMMENDATION 03/05/2019 SLP Diet Recommendations (No Data) Liquid Administration via -- Medication Administration (No Data) Compensations (No Data) Postural Changes Remain semi-upright after after feeds/meals (Comment);Seated upright at 90 degrees   CHL IP OTHER RECOMMENDATIONS 03/24/2019 Recommended Consults -- Oral Care Recommendations Oral care before and after PO Other Recommendations Have oral suction available   CHL IP FOLLOW UP RECOMMENDATIONS 03/08/2019 Follow up Recommendations None   CHL IP FREQUENCY AND DURATION 03/04/2019 Speech Therapy Frequency (ACUTE ONLY) min 2x/week Treatment Duration 2 weeks      CHL IP ORAL PHASE 03/16/2019 Oral Phase Impaired Oral - Pudding Teaspoon -- Oral - Pudding Cup -- Oral - Honey Teaspoon -- Oral - Honey Cup -- Oral - Nectar Teaspoon NT Oral - Nectar Cup -- Oral - Nectar Straw WFL Oral - Thin Teaspoon WFL Oral - Thin Cup -- Oral - Thin Straw WFL;Piecemeal swallowing;Other (Comment) Oral - Puree WFL Oral - Mech Soft -- Oral - Regular WFL Oral - Multi-Consistency -- Oral - Pill -- Oral Phase - Comment --  CHL IP PHARYNGEAL PHASE 03/08/2019 Pharyngeal Phase Impaired Pharyngeal- Pudding Teaspoon -- Pharyngeal -- Pharyngeal- Pudding Cup -- Pharyngeal -- Pharyngeal- Honey Teaspoon --  Pharyngeal -- Pharyngeal- Honey Cup -- Pharyngeal -- Pharyngeal- Nectar Teaspoon -- Pharyngeal -- Pharyngeal- Nectar Cup -- Pharyngeal -- Pharyngeal- Nectar Straw WFL Pharyngeal Material does not enter airway Pharyngeal- Thin Teaspoon WFL Pharyngeal Material does not enter airway Pharyngeal- Thin Cup -- Pharyngeal -- Pharyngeal- Thin Straw WFL Pharyngeal Material does not enter airway Pharyngeal- Puree WFL Pharyngeal Material does not enter airway Pharyngeal- Mechanical Soft -- Pharyngeal -- Pharyngeal- Regular WFL Pharyngeal Material does not enter airway Pharyngeal- Multi-consistency -- Pharyngeal -- Pharyngeal- Pill -- Pharyngeal -- Pharyngeal Comment --  CHL IP CERVICAL ESOPHAGEAL PHASE 03/06/2019 Cervical Esophageal Phase Impaired Pudding Teaspoon -- Pudding Cup -- Honey Teaspoon -- Honey Cup -- Nectar Teaspoon -- Nectar Cup -- Nectar Straw Reduced cricopharyngeal relaxation;Esophageal backflow into the pharynx;Prominent cricopharyngeal segment Thin Teaspoon Reduced cricopharyngeal relaxation;Esophageal backflow into the pharynx;Prominent cricopharyngeal segment Thin Cup -- Thin Straw Reduced cricopharyngeal relaxation;Esophageal backflow into the pharynx;Prominent cricopharyngeal segment Puree Reduced cricopharyngeal relaxation;Esophageal backflow into the pharynx;Prominent cricopharyngeal segment Mechanical Soft -- Regular Reduced cricopharyngeal relaxation;Esophageal backflow into the pharynx;Prominent cricopharyngeal segment Multi-consistency -- Pill -- Cervical Esophageal Comment -- Kathleen Lime, MS [facility] SLP Acute Rehab Services Office [phone] Macario Golds 03/26/2019, 9:54 AM              ECHOCARDIOGRAM COMPLETE  Result Date: 03/17/2019   ECHOCARDIOGRAM REPORT   Patient Name:   Stanley Hunter Date of Exam: 03/17/2019 Medical Rec #:  [id number]    Height:       69.0 in Accession #:    [id number]   Weight:       163.4 lb Date of Birth:  05/16/1924     BSA:          1.90 m Patient Age:    25 years     BP:            141/74 mmHg Patient Gender: M            HR:           125 bpm. Exam Location:  Inpatient  Procedure: 2D Echo Indications:    427.31 atrial fibrillation  History:        Patient has no prior history of Echocardiogram examinations.                 Pacemaker, Arrythmias:Atrial Flutter; Risk Factors:Hypertension,                 Dyslipidemia and Former Smoker.  Sonographer:    Jannett Celestine RDCS (AE) Referring Phys: [id number] RIPUDEEP K RAI  Sonographer Comments: restricted mobility IMPRESSIONS  1. Left ventricular ejection fraction, by visual estimation, is 65 to 70%. The left ventricle has hyperdynamic function. There is mildly increased left ventricular hypertrophy.  2. The left ventricle has no regional wall motion abnormalities.  3. Global right ventricle has normal systolic function.The right ventricular size is normal. No increase in right ventricular wall thickness.  4. Left atrial size was normal.  5. Right atrial size was normal.  6. Mild to moderate mitral annular calcification.  7. The mitral valve is normal in structure. No evidence of mitral valve regurgitation. No evidence of mitral stenosis.  8. The tricuspid valve is normal in structure.  9. The tricuspid valve is normal in structure. Tricuspid valve regurgitation is not demonstrated. 10. The aortic valve is normal in structure. Aortic valve regurgitation is not visualized. Mild to moderate aortic valve sclerosis/calcification without any evidence of aortic stenosis. 11. The pulmonic valve was normal in structure. Pulmonic valve regurgitation is not visualized. 12. The inferior vena cava is normal in size with greater than 50% respiratory variability, suggesting right atrial pressure of 3 mmHg. FINDINGS  Left Ventricle: Left ventricular ejection fraction, by visual estimation, is 65 to 70%. The left ventricle has hyperdynamic function. The left ventricle has no regional wall motion abnormalities. There is mildly increased left ventricular  hypertrophy. Normal left atrial pressure. Right Ventricle: The right ventricular size is normal. No increase in right ventricular wall thickness. Global RV systolic function is has normal systolic function. Left Atrium: Left atrial size was normal in size. Right Atrium: Right atrial size was normal in size Pericardium: There is no evidence of pericardial effusion. Mitral Valve: The mitral valve is normal in structure. Mild to moderate mitral annular calcification. No evidence of mitral valve regurgitation. No evidence of mitral valve stenosis by observation. Tricuspid Valve: The tricuspid valve is normal in structure. Tricuspid valve regurgitation is not demonstrated. Aortic Valve: The aortic valve is normal in structure. Aortic valve regurgitation is not visualized. Mild to moderate aortic valve sclerosis/calcification is present, without any evidence of aortic stenosis. Pulmonic Valve: The pulmonic valve was normal in structure. Pulmonic valve regurgitation is not visualized. Pulmonic regurgitation is not visualized. Aorta: The aortic root, ascending aorta and aortic arch are all structurally normal, with no evidence of dilitation or obstruction. Venous: The inferior vena cava is normal in size with greater than 50% respiratory variability, suggesting right atrial pressure of 3 mmHg. IAS/Shunts: No atrial level shunt detected by color flow Doppler. There is no evidence of a patent foramen ovale. No ventricular septal defect is seen or detected. There is no evidence of an atrial septal defect.  LEFT VENTRICLE PLAX 2D LVIDd:         3.70 cm LVIDs:         2.30 cm LV PW:         1.40 cm LV IVS:        1.00 cm LVOT diam:     2.30 cm LV SV:  40 ml LV SV Index:   20.99 LVOT Area:     4.15 cm  LEFT ATRIUM             Index LA diam:        3.30 cm 1.74 cm/m LA Vol (A2C):   38.9 ml 20.52 ml/m LA Vol (A4C):   44.8 ml 23.63 ml/m LA Biplane Vol: 44.3 ml 23.37 ml/m  AORTIC VALVE LVOT Vmax:   85.80 cm/s LVOT Vmean:   68.000 cm/s LVOT VTI:    0.154 m  AORTA Ao Root diam: 3.30 cm  SHUNTS Systemic VTI:  0.15 m Systemic Diam: 2.30 cm  Candee Furbish MD Electronically signed by Candee Furbish MD Signature Date/Time: 03/17/2019/3:29:30 PM    Final    VAS [place] LOWER EXTREMITY VENOUS (DVT)  Result Date: 03/16/2019  Lower Venous Study Indications: Edema.  Risk Factors: None identified. Limitations: Patient positioning, patient movement. Comparison Study: No prior studies. Performing Technologist: Oliver Hum RVT  Examination Guidelines: A complete evaluation includes B-mode imaging, spectral Doppler, color Doppler, and power Doppler as needed of all accessible portions of each vessel. Bilateral testing is considered an integral part of a complete examination. Limited examinations for reoccurring indications may be performed as noted.  +---------+---------------+---------+-----------+----------+--------------+ RIGHT    CompressibilityPhasicitySpontaneityPropertiesThrombus Aging +---------+---------------+---------+-----------+----------+--------------+ CFV      Full           Yes      Yes                                 +---------+---------------+---------+-----------+----------+--------------+ SFJ      Full                                                        +---------+---------------+---------+-----------+----------+--------------+ FV Prox  Full                                                        +---------+---------------+---------+-----------+----------+--------------+ FV Mid   Full                                                        +---------+---------------+---------+-----------+----------+--------------+ FV DistalFull                                                        +---------+---------------+---------+-----------+----------+--------------+ PFV      Full                                                         +---------+---------------+---------+-----------+----------+--------------+ POP      Full  Yes      Yes                                 +---------+---------------+---------+-----------+----------+--------------+ PTV      Full                                                        +---------+---------------+---------+-----------+----------+--------------+ PERO     Full                                                        +---------+---------------+---------+-----------+----------+--------------+   +---------+---------------+---------+-----------+----------+--------------+ LEFT     CompressibilityPhasicitySpontaneityPropertiesThrombus Aging +---------+---------------+---------+-----------+----------+--------------+ CFV      Full           Yes      Yes                                 +---------+---------------+---------+-----------+----------+--------------+ SFJ      Full                                                        +---------+---------------+---------+-----------+----------+--------------+ FV Prox  Full                                                        +---------+---------------+---------+-----------+----------+--------------+ FV Mid   Full                                                        +---------+---------------+---------+-----------+----------+--------------+ FV DistalFull                                                        +---------+---------------+---------+-----------+----------+--------------+ PFV      Full                                                        +---------+---------------+---------+-----------+----------+--------------+ POP      Full           Yes      Yes                                 +---------+---------------+---------+-----------+----------+--------------+ PTV  Full                                                         +---------+---------------+---------+-----------+----------+--------------+ PERO     Full                                                        +---------+---------------+---------+-----------+----------+--------------+     Summary: Right: There is no evidence of deep vein thrombosis in the lower extremity. No cystic structure found in the popliteal fossa. Left: There is no evidence of deep vein thrombosis in the lower extremity. No cystic structure found in the popliteal fossa.  *See table(s) above for measurements and observations. Electronically signed by Deitra Mayo MD on 03/16/2019 at 12:51:29 PM.    Final     PERFORMANCE STATUS (ECOG) : 3 - Symptomatic, >50% confined to bed  Review of Systems Unless otherwise noted, a complete review of systems is negative.  Physical Exam General: Frail appearing Cardiovascular: regular rate and rhythm Pulmonary: Coarse, wheezing anterior fields Abdomen: soft, nontender, + bowel sounds GU: no suprapubic tenderness Extremities: no edema, no joint deformities Skin: no rashes Neurological: Weakness but otherwise nonfocal  IMPRESSION: CTA yesterday showed large right-sided pleural effusion.  Patient reportedly pending thoracentesis.    Patient remains in ICU/stepdown on nonrebreather.  Patient is alert but slightly confused to date and situation.  He says he feels his breathing is slightly better today.  His ability to verbally communicate is somewhat challenged.  He is currently being fed breakfast.  I called and spoke with patient's wife.  Together, we reviewed patient's current medical problems.  She appears to recognize the severity of his current illness.  She seems to be in agreement with the current scope of medical treatment and would like to give patient the best chance of recovery.  Following this hospitalization, she has arranged for patient to be discharged to SNF at [facility].  We discussed the option of palliative care  following versus hospice, depending upon patient's progress in the coming days.  PLAN: -Continue current scope of treatment -We will follow   Time Total: 30 minutes  Visit consisted of counseling and education dealing with the complex and emotionally intense issues of symptom management and palliative care in the setting of serious and potentially life-threatening illness.Greater than 50%  of this time was spent counseling and coordinating care related to the above assessment and plan.  Signed by: Altha Harm, PhD, NP-C

## 2019-03-20 NOTE — Progress Notes (Signed)
ANTICOAGULATION CONSULT NOTE - Follow up  Pharmacy Consult for heparin >>Eliquis Indication: atrial fibrillation  Allergies  Allergen Reactions  . Cortisone Other (See Comments)    Unknown reaction  . Neomycin Rash  . Sulfamethoxazole-Trimethoprim Other (See Comments)    Leg weakness, arrhythmia    Patient Measurements: Height: 5\' 9"  (175.3 cm) Weight: 163 lb 5.8 oz (74.1 kg) IBW/kg (Calculated) : 70.7 Heparin Dosing Weight: 76 kg  Vital Signs: Temp: 97.5 F (36.4 C) (01/23 0812) Temp Source: Axillary (01/23 0812) BP: 160/49 (01/23 0600) Pulse Rate: 64 (01/23 0600)  Labs: Recent Labs    03/18/19 0127 03/16/2019 0147 03/20/19 0211  HGB  --  10.8* 10.5*  HCT  --  34.0* 32.8*  PLT  --  202 225  CREATININE 1.32* 1.26* 1.55*    Estimated Creatinine Clearance: 30.4 mL/min (A) (by C-G formula based on SCr of 1.55 mg/dL (H)).   Assessment: 86 yo M presents with SOB and fever. Concern for VTE to started on heparin gtt. Dopplers negative, VQ/CT chest has not been ordered yet. Ddimer slightly elevated at 2.2. Also in Afib with RVR. Has a hx of Afib but not on anticoag PTA. 03/20/2019  To transition from UFH to Eliquis.  Age > 80, wt > 60, SCr borderline around 1.5.  Goal of Therapy:  Heparin level 0.3-0.7 units/ml Monitor platelets by anticoagulation protocol: Yes   Plan:  Eliquis 5mg  PO BID (Check SCr tomorrow, if continues to go up change to 2.5mg  BID) Remains confused, palliative care involved, plan to go to SNF Monitor CBC, s/s of bleed F/U holding Eliquis if thoracentesis needed  Elenor Quinones, PharmD, BCPS, BCIDP Clinical Pharmacist 03/20/2019 10:17 AM

## 2019-03-20 NOTE — Progress Notes (Signed)
Pharmacy Antibiotic Note  Stanley Hunter is a 86 y.o. male admitted on 03/12/2019 with ShOB, fever, initially started on Ceftriaxone & Azithromycin. Concern for aspiration PNA, so Ceftriaxone changed to Unasyn on 03/18/2019. Vancomycin also added 03/18/2019. S/p thoracentesis today for large R pleural effusion. Unasyn now being switched to Cefepime. Pharmacy consulted for Cefepime and Vancomycin dosing.   Plan:  Continue Vancomycin 1500mg  IV q48h - goal AUC 400-550  Cefepime 2g IV q12h  Continue Azithromycin 500mg  PO daily per MD   Monitor renal function, cultures, clinical course, duration of therapy   Height: 5\' 9"  (175.3 cm) Weight: 163 lb 5.8 oz (74.1 kg) IBW/kg (Calculated) : 70.7  Temp (24hrs), Avg:97.5 F (36.4 C), Min:96.5 F (35.8 C), Max:98.1 F (36.7 C)  Recent Labs  Lab 03/02/2019 2221 03/10/2019 2221 03/16/19 1115 03/16/19 1211 03/16/19 1759 03/17/19 0129 03/18/19 0127 03/27/2019 0147 03/20/19 0211  WBC 11.9*  --  12.2*  --   --  10.5  --  11.2* 12.0*  CREATININE 2.41*   < >  --  1.60*  --  1.33* 1.32* 1.26* 1.55*  LATICACIDVEN 1.7  --   --   --  1.3  --   --   --   --    < > = values in this interval not displayed.    Estimated Creatinine Clearance: 30.4 mL/min (A) (by C-G formula based on SCr of 1.55 mg/dL (H)).    Allergies  Allergen Reactions  . Cortisone Other (See Comments)    Unknown reaction  . Neomycin Rash  . Sulfamethoxazole-Trimethoprim Other (See Comments)    Leg weakness, arrhythmia   Antimicrobials this admission: 1/19 Doxycycline x 1 1/19 Azithromycin >> 1/19 Ceftriaxone >> 1/21 1/21 Unasyn >> 1/23 1/21 Vancomycin >> 1/23 Cefepime >>   Microbiology results: 1/18 COVID: neg 1/18 Influenza A/B: neg 1/18 BCx2: 1/4 bottles CoNS 1/19 HIV NR 1/19 MRSA PCR + 1/19 Strep pneumo ur ag: neg 1/19 BCx: NGTD 1/19 I/O cath UCx: 2K Staph warneri 1/20 COVID: neg 1/22 Legionella ur ag: IP  1/23 Pleural fluid cx: sent 1/23 fungus cx (pleural fluid):  sent 1/23 AFB: sent 1/23 acid fast culture (pleural fluid): sent   Thank you for allowing pharmacy to be a part of this patient's care.   Lindell Spar, PharmD, BCPS Clinical Pharmacist  03/20/2019 4:57 PM

## 2019-03-20 NOTE — Progress Notes (Signed)
PROGRESS NOTE  Stanley Hunter [id number] DOB: 02/18/1925 DOA: 03/02/2019 PCP: Mast, Man X, NP  HPI/Recap of past 24 hours: HPI from Dr Ladona Ridgel is a 86 y.o. male with medical history significant of coronary artery disease status post. Cardiac catheterization, chronic kidney disease stage III, essential hypertension, degenerative disc joint disease, history of pacemaker placement 2015, who had his Covid vaccine 2 weeks ago and presents with shortness of breath, cough and fever. Patient also has lower extremity edema. He was found to be hypoxic. Chest x-ray showed lobar pneumonia. In the ED, patient noted to be hypoxic, Covid test negative.  Patient admitted for further management      Today, patient noted to be confused, appears lethargic, deconditioned, significantly SOB, still requiring 15 L high flow nasal cannula/nonrebreather, no fever/chills, chest pain.    Assessment/Plan: Principal Problem:   CAP (community acquired pneumonia) Active Problems:   Pure hypercholesterolemia   History of pacemaker   Hypertensive heart and renal disease   Anemia of chronic disease   Hyperlipidemia   Stage 3b chronic kidney disease   Persistent atrial fibrillation (HCC)   Palliative care encounter   Acute hypoxic respiratory failure likely 2/2 CAP complicated by large R pleural effusion Currently still requiring more O2, currently at about 15L Afebrile, with no leukocytosis Urine strep pneumo negative, Legionella pending COVID-19, influenza panel negative ABG pending Chest x-ray with right basilar infiltrate CTA chest showed no PE, large right pleural effusion with complete atelectasis involving the right upper lobe, moderate left pleural effusion with left upper lobe airspace opacity SLP on board S/P ceftriaxone/IV Unasyn, start cefepime, continue azithromycin, vancomycin (MRSA PCR +) Will give a dose of IV lasix 40 mg once DuoNeb, Supplemental O2 as needed Monitor  closely  A. fib with RVR HR with better control Had previous flutter ablation, pacemaker implantation secondary to SSS Venous Dopplers lower extremity negative for DVT Cardiology on board, hold off on cardioversion S/p Cardizem drip, switch to p.o Cardizem, continue propafenone Continue Eliquis  ??UTI Urine culture grew Staphylococcus warneri, 2000 colonies Continue vancomycin as above  ?CHF Elevated BNP, 434.8 Echo with EF of 65 to 70%, no regional wall motion abnormality  AKI on CKD stage IIIb Baseline creatinine 1.9 Daily BMP  Hypertension Hold home Norvasc Continue diltiazem  Hyperlipidemia Continue statins  GERD Continue PPI  BPH Continue Flomax  GOC Poor prognosis, palliative consulted         Malnutrition Type:      Malnutrition Characteristics:      Nutrition Interventions:       Estimated body mass index is 24.12 kg/m as calculated from the following:   Height as of this encounter: 5\' 9"  (1.753 m).   Weight as of this encounter: 74.1 kg.     Code Status: DNR  Family Communication: Spoke to wife on 03/17/2019  Disposition Plan: May require SNF Vs hospice pending clinical improvement   Consultants:  Cardiology  Palliative  Procedures:  None  Antimicrobials:  Azithromycin  Cefepime  Vancomycin  DVT prophylaxis: Eliquis   Objective: Vitals:   03/20/19 0832 03/20/19 1050 03/20/19 1200 03/20/19 1612  BP:  (!) 148/49 (!) 140/43   Pulse:  65 60   Resp:  (!) 26 (!) 24   Temp:   (!) 97.4 F (36.3 C) (!) 96.5 F (35.8 C)  TempSrc:   Axillary Axillary  SpO2: 94% 96% 96%   Weight:      Height:        Intake/Output  Summary (Last 24 hours) at 03/20/2019 1620 Last data filed at 03/20/2019 [id number] Gross per 24 hour  Intake 297.87 ml  Output 400 ml  Net -102.13 ml   Filed Weights   03/16/19 0222 03/16/19 1653  Weight: 75.8 kg 74.1 kg    Exam:  General: NAD, lethargic, deconditioned, confused  Cardiovascular:  S1, S2 present  Respiratory:  Coarse breath sounds noted bilaterally  Abdomen: Soft, nontender, nondistended, bowel sounds present  Musculoskeletal: No bilateral pedal edema noted  Skin: Normal  Psychiatry: Normal mood    Data Reviewed: CBC: Recent Labs  Lab 02/27/2019 2221 03/16/19 1115 03/17/19 0129 02/26/2019 0147 03/20/19 0211  WBC 11.9* 12.2* 10.5 11.2* 12.0*  NEUTROABS 8.1*  --   --  8.8* 9.6*  HGB 11.4* 11.6* 11.3* 10.8* 10.5*  HCT 35.1* 35.6* 35.6* 34.0* 32.8*  MCV 96.2 96.0 96.2 96.6 95.9  PLT 181 184 199 202 [id number]   Basic Metabolic Panel: Recent Labs  Lab 03/16/19 1211 03/17/19 0129 03/18/19 0127 03/18/2019 0147 03/20/19 0211  NA 140 139 140 142 142  K 3.9 3.9 3.9 3.8 3.7  CL 107 109 109 111 111  CO2 22 19* 20* 20* 20*  GLUCOSE 118* 113* 134* 141* 129*  BUN 28* 26* 28* 26* 30*  CREATININE 1.60* 1.33* 1.32* 1.26* 1.55*  CALCIUM 7.8* 8.0* 8.3* 8.1* 8.1*  MG  --  2.0  --   --   --    GFR: Estimated Creatinine Clearance: 30.4 mL/min (A) (by C-G formula based on SCr of 1.55 mg/dL (H)). Liver Function Tests: Recent Labs  Lab 02/27/2019 2221  AST 21  ALT 17  ALKPHOS 78  BILITOT 1.0  PROT 6.4*  ALBUMIN 2.8*   No results for input(s): LIPASE, AMYLASE in the last 168 hours. No results for input(s): AMMONIA in the last 168 hours. Coagulation Profile: Recent Labs  Lab 03/16/2019 2221  INR 1.4*   Cardiac Enzymes: No results for input(s): CKTOTAL, CKMB, CKMBINDEX, TROPONINI in the last 168 hours. BNP (last 3 results) No results for input(s): PROBNP in the last 8760 hours. HbA1C: No results for input(s): HGBA1C in the last 72 hours. CBG: No results for input(s): GLUCAP in the last 168 hours. Lipid Profile: No results for input(s): CHOL, HDL, LDLCALC, TRIG, CHOLHDL, LDLDIRECT in the last 72 hours. Thyroid Function Tests: No results for input(s): TSH, T4TOTAL, FREET4, T3FREE, THYROIDAB in the last 72 hours. Anemia Panel: No results for input(s):  VITAMINB12, FOLATE, FERRITIN, TIBC, IRON, RETICCTPCT in the last 72 hours. Urine analysis:    Component Value Date/Time   COLORURINE YELLOW 03/16/2019 0222   APPEARANCEUR HAZY (A) 03/16/2019 0222   LABSPEC 1.024 03/16/2019 0222   PHURINE 5.0 03/16/2019 0222   GLUCOSEU NEGATIVE 03/16/2019 0222   HGBUR NEGATIVE 03/16/2019 0222   BILIRUBINUR NEGATIVE 03/16/2019 0222   KETONESUR 5 (A) 03/16/2019 0222   PROTEINUR 30 (A) 03/16/2019 0222   NITRITE NEGATIVE 03/16/2019 0222   LEUKOCYTESUR NEGATIVE 03/16/2019 0222   Sepsis Labs: @LABRCNTIP (procalcitonin:4,lacticidven:4)  ) Recent Results (from the past 240 hour(s))  Blood Culture (routine x 2)     Status: None (Preliminary result)   Collection Time: 03/17/2019 10:21 PM   Specimen: BLOOD  Result Value Ref Range Status   Specimen Description   Final    BLOOD BLOOD RIGHT ARM Performed at [facility], [place] [address], [place], [place] [address]    Special Requests   Final    BOTTLES DRAWN AEROBIC AND ANAEROBIC Blood Culture adequate  volume Performed at [facility], [place] [address] [place], [place] [address]    Culture   Final    NO GROWTH 4 DAYS Performed at [facility], [place] [address] [place], [place] [address]    Report Status PENDING  Incomplete  Blood Culture (routine x 2)     Status: Abnormal   Collection Time: 03/12/2019 10:26 PM   Specimen: BLOOD  Result Value Ref Range Status   Specimen Description   Final    BLOOD BLOOD LEFT HAND Performed at [place] [address], [place], [place] [address]    Special Requests   Final    BOTTLES DRAWN AEROBIC AND ANAEROBIC Blood Culture adequate volume Performed at [place] [address] [place], [place] [address]    Culture  Setup Time   Final    ANAEROBIC BOTTLE ONLY GRAM POSITIVE COCCI CRITICAL RESULT CALLED TO, READ BACK BY AND VERIFIED WITH: B GREEN PHARMD 03/17/19 0137 JDW    Culture  (A)  Final    STAPHYLOCOCCUS SPECIES (COAGULASE NEGATIVE) THE SIGNIFICANCE OF ISOLATING THIS ORGANISM FROM A SINGLE SET OF BLOOD CULTURES WHEN MULTIPLE SETS ARE DRAWN IS UNCERTAIN. PLEASE NOTIFY THE MICROBIOLOGY DEPARTMENT WITHIN ONE WEEK IF SPECIATION AND SENSITIVITIES ARE REQUIRED. Performed at [facility], [place] [address], [place],  [address]    Report Status 03/12/2019 FINAL  Final  Respiratory Panel by RT PCR (Flu A&B, Covid) - Nasopharyngeal Swab     Status: None   Collection Time: 03/12/2019 11:01 PM   Specimen: Nasopharyngeal Swab  Result Value Ref Range Status   SARS Coronavirus 2 by RT PCR NEGATIVE NEGATIVE Final    Comment: (NOTE) SARS-CoV-2 target nucleic acids are NOT DETECTED. The SARS-CoV-2 RNA is generally detectable in upper respiratoy specimens during the acute phase of infection. The lowest concentration of SARS-CoV-2 viral copies this assay can detect is 131 copies/mL. A negative result does not preclude SARS-Cov-2 infection and should not be used as the sole basis for treatment or other patient management decisions. A negative result may occur with  improper specimen collection/handling, submission of specimen other than nasopharyngeal swab, presence of viral mutation(s) within the areas targeted by this assay, and inadequate number of viral copies (<131 copies/mL). A negative result must be combined with clinical observations, patient history, and epidemiological information. The expected result is Negative. Fact Sheet for Patients:  PinkCheek.be Fact Sheet for Healthcare Providers:  GravelBags.it This test is not yet ap proved or cleared by the [place] FDA and  has been authorized for detection and/or diagnosis of SARS-CoV-2 by FDA under an Emergency Use Authorization (EUA). This EUA will remain  in effect (meaning this test can be used) for the duration of the COVID-19 declaration under  Section 564(b)(1) of the Act, 21 U.S.C. section 360bbb-3(b)(1), unless the authorization is terminated or revoked sooner.    Influenza A by PCR NEGATIVE NEGATIVE Final   Influenza B by PCR NEGATIVE NEGATIVE Final    Comment: (NOTE) The Xpert Xpress SARS-CoV-2/FLU/RSV assay is intended as an aid in  the diagnosis of influenza from Nasopharyngeal swab specimens and  should not be used as a sole basis for treatment. Nasal washings and  aspirates are unacceptable for Xpert Xpress SARS-CoV-2/FLU/RSV  testing. Fact Sheet for Patients: PinkCheek.be Fact Sheet for Healthcare Providers: GravelBags.it This test is not yet approved or cleared by the [place] FDA and  has been authorized for detection and/or diagnosis of  SARS-CoV-2 by  FDA under an Emergency Use Authorization (EUA). This EUA will remain  in effect (meaning this test can be used) for the duration of the  Covid-19 declaration under Section 564(b)(1) of the Act, 21  U.S.C. section 360bbb-3(b)(1), unless the authorization is  terminated or revoked. Performed at [facility], [place] [address], [place], [place] [address]   Urine culture     Status: Abnormal   Collection Time: 03/16/19  2:22 AM   Specimen: In/Out Cath Urine  Result Value Ref Range Status   Specimen Description   Final    IN/OUT CATH URINE Performed at [facility] [place] [address], [place], [place] [address]    Special Requests   Final    NONE Performed at [facility], [place] [address] [place], [place] [address]    Culture 2,000 COLONIES/mL STAPHYLOCOCCUS WARNERI (A)  Final   Report Status 03/17/2019 FINAL  Final   Organism ID, Bacteria STAPHYLOCOCCUS WARNERI (A)  Final      Susceptibility   Staphylococcus warneri - MIC*    CIPROFLOXACIN <=0.5 SENSITIVE Sensitive     GENTAMICIN <=0.5 SENSITIVE Sensitive     NITROFURANTOIN 32 SENSITIVE  Sensitive     OXACILLIN <=0.25 SENSITIVE Sensitive     TETRACYCLINE >=16 RESISTANT Resistant     VANCOMYCIN <=0.5 SENSITIVE Sensitive     TRIMETH/SULFA <=10 SENSITIVE Sensitive     CLINDAMYCIN RESISTANT Resistant     RIFAMPIN <=0.5 SENSITIVE Sensitive     Inducible Clindamycin POSITIVE Resistant     * 2,000 COLONIES/mL STAPHYLOCOCCUS WARNERI  MRSA PCR Screening     Status: Abnormal   Collection Time: 03/16/19  4:53 PM   Specimen: Nasopharyngeal  Result Value Ref Range Status   MRSA by PCR POSITIVE (A) NEGATIVE Final    Comment:        The GeneXpert MRSA Assay (FDA approved for NASAL specimens only), is one component of a comprehensive MRSA colonization surveillance program. It is not intended to diagnose MRSA infection nor to guide or monitor treatment for MRSA infections. RESULT CALLED TO, READ BACK BY AND VERIFIED WITH: GARLAND,G @ 2020 ON [id number] BY POTEAT,S Performed at [facility], [place] [address] [place], [place] [address]   Culture, blood (routine x 2) Call MD if unable to obtain prior to antibiotics being given     Status: None (Preliminary result)   Collection Time: 03/16/19  5:59 PM   Specimen: BLOOD RIGHT ARM  Result Value Ref Range Status   Specimen Description   Final    BLOOD RIGHT ARM Performed at [facility] [address], [place], [place] [address]    Special Requests   Final    BOTTLES DRAWN AEROBIC ONLY Blood Culture adequate volume Performed at [place] [address] [place], [place] [address]    Culture   Final    NO GROWTH 4 DAYS Performed at [facility], [place] [address], [place], [place] [address]    Report Status PENDING  Incomplete  Culture, blood (routine x 2) Call MD if unable to obtain prior to antibiotics being given     Status: None (Preliminary result)   Collection Time: 03/16/19  5:59 PM   Specimen: BLOOD  Result Value Ref Range Status   Specimen Description    Final    BLOOD RIGHT ANTECUBITAL Performed at [place] [address] [place], [place] [address]    Special  Requests   Final    BOTTLES DRAWN AEROBIC AND ANAEROBIC Blood Culture adequate volume Performed at [place] [address], [place], [place] [address]    Culture   Final    NO GROWTH 4 DAYS Performed at [facility], [place] [address], [place], [place] [address]    Report Status PENDING  Incomplete  SARS CORONAVIRUS 2 (TAT 6-24 HRS) Nasopharyngeal Nasopharyngeal Swab     Status: None   Collection Time: 03/17/19  6:30 PM   Specimen: Nasopharyngeal Swab  Result Value Ref Range Status   SARS Coronavirus 2 NEGATIVE NEGATIVE Final    Comment: (NOTE) SARS-CoV-2 target nucleic acids are NOT DETECTED. The SARS-CoV-2 RNA is generally detectable in upper and lower respiratory specimens during the acute phase of infection. Negative results do not preclude SARS-CoV-2 infection, do not rule out co-infections with other pathogens, and should not be used as the sole basis for treatment or other patient management decisions. Negative results must be combined with clinical observations, patient history, and epidemiological information. The expected result is Negative. Fact Sheet for Patients: SugarRoll.be Fact Sheet for Healthcare Providers: https://www.woods-mathews.com/ This test is not yet approved or cleared by the [place] FDA and  has been authorized for detection and/or diagnosis of SARS-CoV-2 by FDA under an Emergency Use Authorization (EUA). This EUA will remain  in effect (meaning this test can be used) for the duration of the COVID-19 declaration under Section 56 4(b)(1) of the Act, 21 U.S.C. section 360bbb-3(b)(1), unless the authorization is terminated or revoked sooner. Performed at [facility], [place] [address] [place], [place] [address]       Studies: DG Chest Port  1 View  Result Date: 03/20/2019 CLINICAL DATA:  Post RIGHT side thoracentesis EXAM: PORTABLE CHEST 1 VIEW COMPARISON:  Portable exam 1149 hours compared to 03/17/2019 FINDINGS: RIGHT subclavian pacemaker leads project over RIGHT atrium and RIGHT ventricle unchanged. Enlargement of cardiac silhouette. Atherosclerotic calcification aorta. BILATERAL pulmonary infiltrates diffusely, significantly increased on LEFT since previous exam. Decreased RIGHT pleural effusion and basilar atelectasis post thoracentesis. No pneumothorax. Small LEFT pleural effusion. Bones demineralized. IMPRESSION: No pneumothorax following RIGHT thoracentesis. Diffuse BILATERAL pulmonary infiltrates question edema versus multifocal pneumonia, significantly increased on LEFT since prior study. Electronically Signed   By: Lavonia Dana M.D.   On: 03/20/2019 12:13   US THORACENTESIS ASP PLEURAL SPACE W/IMG GUIDE  Result Date: 03/20/2019 INDICATION: Shortness of breath, pneumonia. Right-sided pleural effusion. Request diagnostic and therapeutic thoracentesis. EXAM: ULTRASOUND GUIDED RIGHT THORACENTESIS MEDICATIONS: None. COMPLICATIONS: None immediate. PROCEDURE: An ultrasound guided thoracentesis was thoroughly discussed with the patient and questions answered. The benefits, risks, alternatives and complications were also discussed. The patient understands and wishes to proceed with the procedure. Written consent was obtained. Ultrasound was performed to localize and mark an adequate pocket of fluid in the right chest. The area was then prepped and draped in the normal sterile fashion. 1% Lidocaine was used for local anesthesia. Under ultrasound guidance a 6 Fr Safe-T-Centesis catheter was introduced. Thoracentesis was performed. The catheter was removed and a dressing applied. FINDINGS: A total of approximately 1.1 L of hazy, amber fluid was removed. Samples were sent to the laboratory as requested by the clinical team. IMPRESSION: Successful  ultrasound guided right thoracentesis yielding 1.1 L of pleural fluid. Read by: Ascencion Dike PA-C Electronically Signed   By: Markus Daft M.D.   On: 03/20/2019 12:10    Scheduled Meds: . apixaban  5 mg Oral  BID  . atorvastatin  20 mg Oral q1800  . azithromycin  500 mg Oral Daily  . Chlorhexidine Gluconate Cloth  6 each Topical Daily  . dextromethorphan-guaiFENesin  1 tablet Oral BID  . diltiazem  360 mg Oral Daily  . levalbuterol  0.63 mg Nebulization Q6H  . lidocaine      . mouth rinse  15 mL Mouth Rinse BID  . mupirocin ointment  1 application Nasal BID  . pantoprazole  20 mg Oral Daily  . propafenone  225 mg Oral Q6H  . tamsulosin  0.4 mg Oral QHS    Continuous Infusions: . ampicillin-sulbactam (UNASYN) IV Stopped (03/20/19 0912)  . vancomycin       LOS: 4 days     Alma Friendly, MD [facility]  If 7PM-7AM, please contact night-coverage [email or web address] 03/20/2019, 4:20 PM

## 2019-03-20 NOTE — Progress Notes (Signed)
P02 on ABG 87.0 (MD goal per order >=92%). RT placed flap onto partial rebreather to create 100% non rebreather (ABG completed on 15 lpm [place] and PRB). RN aware.

## 2019-03-20 NOTE — Procedures (Signed)
PROCEDURE SUMMARY:  Successful US guided right thoracentesis. Yielded 1.1 L of hazy amber fluid. Pt tolerated procedure well. No immediate complications.  Specimen was sent for labs. CXR ordered.  EBL < 5 mL  Ascencion Dike PA-C 03/20/2019 11:17 AM

## 2019-03-20 NOTE — Progress Notes (Signed)
Progress Note  Patient Name: Stanley Hunter Date of Encounter: 03/20/2019  Primary Cardiologist: Dr Malvin Johns  Subjective   Confused; appears dyspneic; denies CP  Inpatient Medications    Scheduled Meds: . apixaban  5 mg Oral BID  . atorvastatin  20 mg Oral q1800  . azithromycin  500 mg Oral Daily  . Chlorhexidine Gluconate Cloth  6 each Topical Daily  . dextromethorphan-guaiFENesin  1 tablet Oral BID  . diltiazem  360 mg Oral Daily  . levalbuterol  0.63 mg Nebulization Q6H  . mouth rinse  15 mL Mouth Rinse BID  . mupirocin ointment  1 application Nasal BID  . pantoprazole  20 mg Oral Daily  . propafenone  225 mg Oral Q6H  . tamsulosin  0.4 mg Oral QHS   Continuous Infusions: . ampicillin-sulbactam (UNASYN) IV Stopped (03/20/19 0036)  . vancomycin     PRN Meds: ALPRAZolam, levalbuterol   Vital Signs    Vitals:   03/20/19 0000 03/20/19 0247 03/20/19 0400 03/20/19 0600  BP: (!) 147/48  (!) 134/46 (!) 160/49  Pulse: (!) 58  62 64  Resp: (!) 26  20 (!) 28  Temp: 97.7 F (36.5 C)  97.8 F (36.6 C)   TempSrc: Axillary  Axillary   SpO2: 93% 91% 94% 93%  Weight:      Height:        Intake/Output Summary (Last 24 hours) at 03/20/2019 0751 Last data filed at 03/20/2019 [id number] Gross per 24 hour  Intake 297.87 ml  Output 400 ml  Net -102.13 ml   Last 3 Weights 03/16/2019 03/16/2019 01/08/2019  Weight (lbs) 163 lb 5.8 oz 167 lb 167 lb 3.2 oz  Weight (kg) 74.1 kg 75.751 kg 75.841 kg      Telemetry    Sinus with intermittent atrial pacing and pacs - Personally Reviewed   Physical Exam   GEN: Elderly confused appears dyspneic Neck: supple Cardiac: RRR Respiratory: Diminished BS RLL GI: Soft, nontender, non-distended  MS: No edema Neuro:  Moves all ext Psych: confused  Labs    High Sensitivity Troponin:   Recent Labs  Lab 03/10/2019 2221 03/16/19 0222  TROPONINIHS 22* 17      Chemistry Recent Labs  Lab 03/04/2019 2221 03/16/19 1211 03/18/19 0127  03/22/2019 0147 03/20/19 0211  NA 138   < > 140 142 142  K 4.3   < > 3.9 3.8 3.7  CL 106   < > 109 111 111  CO2 21*   < > 20* 20* 20*  GLUCOSE 124*   < > 134* 141* 129*  BUN 36*   < > 28* 26* 30*  CREATININE 2.41*   < > 1.32* 1.26* 1.55*  CALCIUM 8.3*   < > 8.3* 8.1* 8.1*  PROT 6.4*  --   --   --   --   ALBUMIN 2.8*  --   --   --   --   AST 21  --   --   --   --   ALT 17  --   --   --   --   ALKPHOS 78  --   --   --   --   BILITOT 1.0  --   --   --   --   GFRNONAA 22*   < > 47* 49* 38*  GFRAA 26*   < > 54* 57* 44*  ANIONGAP 11   < > 11 11 11    < > = values  in this interval not displayed.     Hematology Recent Labs  Lab 03/17/19 0129 03/01/2019 0147 03/20/19 0211  WBC 10.5 11.2* 12.0*  RBC 3.70* 3.52* 3.42*  HGB 11.3* 10.8* 10.5*  HCT 35.6* 34.0* 32.8*  MCV 96.2 96.6 95.9  MCH 30.5 30.7 30.7  MCHC 31.7 31.8 32.0  RDW 14.9 14.9 15.0  PLT 199 202 225    BNP Recent Labs  Lab 03/16/19 1157  BNP 434.8*     DDimer  Recent Labs  Lab 03/21/2019 2221  DDIMER 2.22*     Radiology    CT ANGIO CHEST PE W OR WO CONTRAST  Result Date: 03/03/2019 CLINICAL DATA:  Shortness of breath. EXAM: CT ANGIOGRAPHY CHEST WITH CONTRAST TECHNIQUE: Multidetector CT imaging of the chest was performed using the standard protocol during bolus administration of intravenous contrast. Multiplanar CT image reconstructions and MIPs were obtained to evaluate the vascular anatomy. CONTRAST:  132mL OMNIPAQUE IOHEXOL 350 MG/ML SOLN COMPARISON:  None. FINDINGS: Cardiovascular: Satisfactory opacification of the pulmonary arteries to the segmental level. No evidence of pulmonary embolism. Mild cardiomegaly is noted. Atherosclerosis of thoracic aorta is noted without aneurysm or dissection. No pericardial effusion. Mediastinum/Nodes: Large sliding-type hiatal hernia is noted. No adenopathy is noted. Thyroid gland is unremarkable. Lungs/Pleura: Large right pleural effusion is noted with complete atelectasis of  the right lower lobe and significant atelectasis involving the right upper lobe. Moderate left pleural effusion is noted with adjacent subsegmental atelectasis. No pneumothorax is noted. Left upper lobe airspace opacity is noted concerning for pneumonia. Upper Abdomen: No acute abnormality. Musculoskeletal: No chest wall abnormality. No acute or significant osseous findings. Review of the MIP images confirms the above findings. IMPRESSION: 1. No definite evidence of pulmonary embolus. 2. Large right pleural effusion is noted with complete atelectasis of the right lower lobe and significant atelectasis involving the right upper lobe. 3. Moderate left pleural effusion is noted with adjacent subsegmental atelectasis. 4. Left upper lobe airspace opacity is noted concerning for pneumonia. 5. Large sliding-type hiatal hernia is noted. Aortic Atherosclerosis (ICD10-I70.0). Electronically Signed   By: Marijo Conception M.D.   On: 03/12/2019 15:48   DG Swallowing Func-Speech Pathology  Result Date: 02/26/2019 Objective Swallowing Evaluation: Type of Study: MBS-Modified Barium Swallow Study  Patient Details Name: Stanley Hunter MRN: [id number] Date of Birth: Apr 14, 1924 Today's Date: 03/04/2019 Time: SLP Start Time (ACUTE ONLY): 0836 -SLP Stop Time (ACUTE ONLY): 0855 SLP Time Calculation (min) (ACUTE ONLY): 19 min Past Medical History: Past Medical History: Diagnosis Date . Acid reflux 08/03/2015 . Anxiety 08/03/2015 . Avitaminosis D 05/18/2012 . Benign essential HTN 02/09/2009  Overview:  Benign Essential Hypertension  . Benign prostatic hyperplasia with urinary obstruction 12/07/2013 . Chronic kidney disease (CKD), stage III (moderate) 11/04/2011 . CN (constipation) 08/03/2015 . Cognitive changes 08/07/2015 . Degenerative arthritis of hip 05/14/2013 . Degenerative arthritis of spine 05/14/2013 . Degenerative disorder of muscle 08/03/2015 . Dysrhythmia 2001  ablation for a-flutter . H/O atrial flutter 08/03/2015  Overview:  Pacemaker placed 2015   . High blood pressure  . High cholesterol  . History of pacemaker 2016 . Hx of cardiac cath 2013 . Loss of balance 08/07/2015 . Lumbar canal stenosis 07/07/2013  Overview:  Lumbar laminectomy 07/07/13 - Dr. Lurene Shadow  . Lumbar scoliosis 05/14/2013 . Macular degeneration 08/07/2015 . Premature contractions, supraventricular 12/15/2008  Overview:  Atrial Premature Complex  . Presence of permanent cardiac pacemaker  . Tremor 08/07/2015  hands . Urinary frequency 08/07/2015 Past Surgical History: Past Surgical History:  Procedure Laterality Date . ATRIAL FLUTTER ABLATION  2001 . CATARACT EXTRACTION Bilateral 2015 . CHOLECYSTECTOMY   . COLONOSCOPY   . MASTOIDECTOMY  1934 . NASAL SEPTOPLASTY W/ TURBINOPLASTY Bilateral 10/14/2018  Procedure: NASAL SEPTOPLASTY WITH BILATERAL  TURBINATE REDUCTION;  Surgeon: Leta Baptist, MD;  Location: [facility];  Service: ENT;  Laterality: Bilateral; . PACEMAKER PLACEMENT  2016 . SPINE SURGERY  2016  LUMBER- lower . TONSILLECTOMY  1934 . TRIGGER FINGER RELEASE Left 12/14/2015  Procedure: RELEASE TRIGGER FINGER/A-1 PULLEY ring finger left;  Surgeon: Daryll Brod, MD;  Location: [facility];  Service: Orthopedics;  Laterality: Left;  FAB HPI: 86 yo male adm to [facility] 2 days previously with difficulty breathing and AMS. Pt found to have pna - CXR today showing progression which is concerning.  Swallow eval ordered.  Pt with PMH + for history significant of coronary artery disease status post. Cardiac catheterization, chronic kidney disease stage III, essential hypertension, degenerative disc joint disease, sinusitis s/p septum surgery in 2020, "Memory difficulty" per Dr Bubba Camp in snapshot, history of pacemaker placement 2015, GERD, who had his Covid vaccine 2 weeks prior to admission, Chest x-ray showed lobar pneumonia.  Concern for aspiration present and swallow eval ordered.  Subjective: pt awake in chair, reports his breathing is better Assessment / Plan / Recommendation CHL IP CLINICAL IMPRESSIONS  03/28/2019 Clinical Impression Patient presents with mild pharyngoesophageal dysphagia with decreased CP/UES relaxation/opening resulting in minimal backflow of barium to distal pharynx.  No aspiration or penetration observed and swallow was timely and strong.  Pt does appear with anterior protrusion of cervical spine at C3-C4 into pharynx which at times diminished epiglottic deflection.   Pt consumed thin via straw sequentially with adequate protection of airway.  Adequate laryngeal elevation/closure observed.  Of note, pt reported sensation of residuals in pharynx x1 after cracker bolus swallow.   Upon esophageal sweep after liquid consumption, he appeared with some retrograde propulsion of thin - ? due to dysmotility.  Recommend continue diet as tolerated being mindful of pt's work of breathing due to risk of impairing reciprocity of swallow/breathing. SLP to follow briefly for esophageal/respiratory precautions.  Thanks for this consult. SLP Visit Diagnosis Dysphagia, pharyngoesophageal phase (R13.14) Attention and concentration deficit following -- Frontal lobe and executive function deficit following -- Impact on safety and function Mild aspiration risk   CHL IP TREATMENT RECOMMENDATION 03/15/2019 Treatment Recommendations Therapy as outlined in treatment plan below   Prognosis 03/14/2019 Prognosis for Safe Diet Advancement Fair Barriers to Reach Goals Other (Comment) Barriers/Prognosis Comment -- CHL IP DIET RECOMMENDATION 03/26/2019 SLP Diet Recommendations (No Data) Liquid Administration via -- Medication Administration (No Data) Compensations (No Data) Postural Changes Remain semi-upright after after feeds/meals (Comment);Seated upright at 90 degrees   CHL IP OTHER RECOMMENDATIONS 03/26/2019 Recommended Consults -- Oral Care Recommendations Oral care before and after PO Other Recommendations Have oral suction available   CHL IP FOLLOW UP RECOMMENDATIONS 03/21/2019 Follow up Recommendations None   CHL IP  FREQUENCY AND DURATION 02/26/2019 Speech Therapy Frequency (ACUTE ONLY) min 2x/week Treatment Duration 2 weeks      CHL IP ORAL PHASE 03/16/2019 Oral Phase Impaired Oral - Pudding Teaspoon -- Oral - Pudding Cup -- Oral - Honey Teaspoon -- Oral - Honey Cup -- Oral - Nectar Teaspoon NT Oral - Nectar Cup -- Oral - Nectar Straw WFL Oral - Thin Teaspoon WFL Oral - Thin Cup -- Oral - Thin Straw WFL;Piecemeal swallowing;Other (Comment) Oral - Puree WFL Oral - Mech Soft --  Oral - Regular WFL Oral - Multi-Consistency -- Oral - Pill -- Oral Phase - Comment --  CHL IP PHARYNGEAL PHASE 03/07/2019 Pharyngeal Phase Impaired Pharyngeal- Pudding Teaspoon -- Pharyngeal -- Pharyngeal- Pudding Cup -- Pharyngeal -- Pharyngeal- Honey Teaspoon -- Pharyngeal -- Pharyngeal- Honey Cup -- Pharyngeal -- Pharyngeal- Nectar Teaspoon -- Pharyngeal -- Pharyngeal- Nectar Cup -- Pharyngeal -- Pharyngeal- Nectar Straw WFL Pharyngeal Material does not enter airway Pharyngeal- Thin Teaspoon WFL Pharyngeal Material does not enter airway Pharyngeal- Thin Cup -- Pharyngeal -- Pharyngeal- Thin Straw WFL Pharyngeal Material does not enter airway Pharyngeal- Puree WFL Pharyngeal Material does not enter airway Pharyngeal- Mechanical Soft -- Pharyngeal -- Pharyngeal- Regular WFL Pharyngeal Material does not enter airway Pharyngeal- Multi-consistency -- Pharyngeal -- Pharyngeal- Pill -- Pharyngeal -- Pharyngeal Comment --  CHL IP CERVICAL ESOPHAGEAL PHASE 03/21/2019 Cervical Esophageal Phase Impaired Pudding Teaspoon -- Pudding Cup -- Honey Teaspoon -- Honey Cup -- Nectar Teaspoon -- Nectar Cup -- Nectar Straw Reduced cricopharyngeal relaxation;Esophageal backflow into the pharynx;Prominent cricopharyngeal segment Thin Teaspoon Reduced cricopharyngeal relaxation;Esophageal backflow into the pharynx;Prominent cricopharyngeal segment Thin Cup -- Thin Straw Reduced cricopharyngeal relaxation;Esophageal backflow into the pharynx;Prominent cricopharyngeal segment Puree  Reduced cricopharyngeal relaxation;Esophageal backflow into the pharynx;Prominent cricopharyngeal segment Mechanical Soft -- Regular Reduced cricopharyngeal relaxation;Esophageal backflow into the pharynx;Prominent cricopharyngeal segment Multi-consistency -- Pill -- Cervical Esophageal Comment -- Kathleen Lime, MS [facility] SLP Acute Rehab Services Office [phone] Macario Golds 03/07/2019, 9:54 AM               Patient Profile     86 y.o. male with a hx of atrial flutter s/p ablation, sick sinus syndrome status postMedtronicpacemaker, symptomatic PVC on Rythmol, hypertension, chronic kidney disease stage III and hyperlipidemia now admitted with hypoxia and PNA. Pt in atrial fib   Assessment & Plan    1 paroxysmal atrial fibrillation-patient has converted to sinus rhythm.  Continue Cardizem at present dose.  Continue propafenone.  Continue apixaban.  This can be held if needed for thoracentesis.  2 pneumonia/respiratory failure-continue antibiotics.  Large right pleural effusion noted on CT.  May benefit from thoracentesis.  3 prior pacemaker  4 hypertension-continue Cardizem.  For questions or updates, please contact [place] Please consult [email or web address] for contact info under        Signed, Kirk Ruths, MD  03/20/2019, 7:51 AM

## 2019-03-20 NOTE — Progress Notes (Signed)
PT Cancellation Note  Patient Details Name: Stanley Hunter MRN: [id number] DOB: Jul 03, 1924   Cancelled Treatment:     PT order received but eval deferred at request of RN - pt on 15l HFNC and non-re-breather and with possible Thoracentesis this date.  Will follow.   Trenice Mesa 03/20/2019, 9:16 AM

## 2019-03-21 ENCOUNTER — Inpatient Hospital Stay (HOSPITAL_COMMUNITY): Payer: Medicare Other

## 2019-03-21 LAB — CULTURE, BLOOD (ROUTINE X 2)
Culture: NO GROWTH
Culture: NO GROWTH
Culture: NO GROWTH
Special Requests: ADEQUATE
Special Requests: ADEQUATE
Special Requests: ADEQUATE

## 2019-03-21 LAB — CBC WITH DIFFERENTIAL/PLATELET
Abs Immature Granulocytes: 0.12 10*3/uL — ABNORMAL HIGH (ref 0.00–0.07)
Basophils Absolute: 0 10*3/uL (ref 0.0–0.1)
Basophils Relative: 0 %
Eosinophils Absolute: 0 10*3/uL (ref 0.0–0.5)
Eosinophils Relative: 0 %
HCT: 35.4 % — ABNORMAL LOW (ref 39.0–52.0)
Hemoglobin: 11.4 g/dL — ABNORMAL LOW (ref 13.0–17.0)
Immature Granulocytes: 1 %
Lymphocytes Relative: 4 %
Lymphs Abs: 0.6 10*3/uL — ABNORMAL LOW (ref 0.7–4.0)
MCH: 30.7 pg (ref 26.0–34.0)
MCHC: 32.2 g/dL (ref 30.0–36.0)
MCV: 95.4 fL (ref 80.0–100.0)
Monocytes Absolute: 1.5 10*3/uL — ABNORMAL HIGH (ref 0.1–1.0)
Monocytes Relative: 11 %
Neutro Abs: 11.8 10*3/uL — ABNORMAL HIGH (ref 1.7–7.7)
Neutrophils Relative %: 84 %
Platelets: 245 10*3/uL (ref 150–400)
RBC: 3.71 MIL/uL — ABNORMAL LOW (ref 4.22–5.81)
RDW: 15 % (ref 11.5–15.5)
WBC: 14.1 10*3/uL — ABNORMAL HIGH (ref 4.0–10.5)
nRBC: 0 % (ref 0.0–0.2)

## 2019-03-21 LAB — LEGIONELLA PNEUMOPHILA SEROGP 1 UR AG: L. pneumophila Serogp 1 Ur Ag: NEGATIVE

## 2019-03-21 LAB — BASIC METABOLIC PANEL
Anion gap: 11 (ref 5–15)
BUN: 27 mg/dL — ABNORMAL HIGH (ref 8–23)
CO2: 22 mmol/L (ref 22–32)
Calcium: 8.1 mg/dL — ABNORMAL LOW (ref 8.9–10.3)
Chloride: 111 mmol/L (ref 98–111)
Creatinine, Ser: 1.5 mg/dL — ABNORMAL HIGH (ref 0.61–1.24)
GFR calc Af Amer: 46 mL/min — ABNORMAL LOW (ref >60)
GFR calc non Af Amer: 40 mL/min — ABNORMAL LOW (ref >60)
Glucose, Bld: 130 mg/dL — ABNORMAL HIGH (ref 70–99)
Potassium: 3.5 mmol/L (ref 3.5–5.1)
Sodium: 144 mmol/L (ref 135–145)

## 2019-03-21 MED ORDER — HALOPERIDOL LACTATE 5 MG/ML IJ SOLN
2.5000 mg | Freq: Once | INTRAMUSCULAR | Status: AC
  Administered 2019-03-21: 20:00:00 2.5 mg via INTRAVENOUS
  Filled 2019-03-21: qty 1

## 2019-03-21 MED ORDER — LORAZEPAM 2 MG/ML IJ SOLN
1.0000 mg | Freq: Once | INTRAMUSCULAR | Status: AC | PRN
  Administered 2019-03-21: 23:00:00 1 mg via INTRAVENOUS
  Filled 2019-03-21: qty 1

## 2019-03-21 MED ORDER — FUROSEMIDE 10 MG/ML IJ SOLN
40.0000 mg | Freq: Every day | INTRAMUSCULAR | Status: DC
  Administered 2019-03-21 – 2019-03-22 (×2): 40 mg via INTRAVENOUS
  Filled 2019-03-21 (×2): qty 4

## 2019-03-21 NOTE — Progress Notes (Signed)
Progress Note  Patient Name: Stanley Hunter Date of Encounter: 03/21/2019  Primary Cardiologist: Dr Malvin Johns  Subjective   On 100 FIO2; lethargic; denies CP  Inpatient Medications    Scheduled Meds:  apixaban  5 mg Oral BID   atorvastatin  20 mg Oral q1800   azithromycin  500 mg Oral Daily   Chlorhexidine Gluconate Cloth  6 each Topical Daily   dextromethorphan-guaiFENesin  1 tablet Oral BID   diltiazem  360 mg Oral Daily   furosemide  40 mg Intravenous Daily   levalbuterol  0.63 mg Nebulization Q6H   mouth rinse  15 mL Mouth Rinse BID   mupirocin ointment  1 application Nasal BID   pantoprazole  20 mg Oral Daily   propafenone  225 mg Oral Q6H   tamsulosin  0.4 mg Oral QHS   Continuous Infusions:  ceFEPime (MAXIPIME) IV 2 g (03/21/19 0600)   vancomycin Stopped (03/20/19 1950)   PRN Meds: ALPRAZolam, levalbuterol   Vital Signs    Vitals:   03/21/19 0231 03/21/19 0330 03/21/19 0331 03/21/19 0400  BP:   (!) 146/44 (!) 155/43  Pulse:  (!) 37 61 (!) 59  Resp:  (!) 24 (!) 27 (!) 23  Temp:  (!) 97.2 F (36.2 C)    TempSrc:  Axillary    SpO2: 94% 93% 95% 94%  Weight:      Height:        Intake/Output Summary (Last 24 hours) at 03/21/2019 0733 Last data filed at 03/21/2019 0600 Gross per 24 hour  Intake 1050.66 ml  Output 1600 ml  Net -549.34 ml   Last 3 Weights 03/16/2019 03/16/2019 01/08/2019  Weight (lbs) 163 lb 5.8 oz 167 lb 167 lb 3.2 oz  Weight (kg) 74.1 kg 75.751 kg 75.841 kg      Telemetry    Sinus with intermittent atrial pacing and pacs - Personally Reviewed   Physical Exam   GEN: Elderly NAD Neck: supple Cardiac: RRR, no gallop Respiratory: mild diffuse rhonchi GI: Soft, ND MS: No edema Neuro:  Lethargic; answers questions   Labs    High Sensitivity Troponin:   Recent Labs  Lab 03/13/2019 2221 03/16/19 0222  TROPONINIHS 22* 17      Chemistry Recent Labs  Lab 02/27/2019 2221 03/16/19 1211 02/27/2019 0147  03/20/19 0211 03/21/19 0229  NA 138   < > 142 142 144  K 4.3   < > 3.8 3.7 3.5  CL 106   < > 111 111 111  CO2 21*   < > 20* 20* 22  GLUCOSE 124*   < > 141* 129* 130*  BUN 36*   < > 26* 30* 27*  CREATININE 2.41*   < > 1.26* 1.55* 1.50*  CALCIUM 8.3*   < > 8.1* 8.1* 8.1*  PROT 6.4*  --   --   --   --   ALBUMIN 2.8*  --   --   --   --   AST 21  --   --   --   --   ALT 17  --   --   --   --   ALKPHOS 78  --   --   --   --   BILITOT 1.0  --   --   --   --   GFRNONAA 22*   < > 49* 38* 40*  GFRAA 26*   < > 57* 44* 46*  ANIONGAP 11   < > 11 11  11   < > = values in this interval not displayed.     Hematology Recent Labs  Lab 03/28/2019 0147 03/20/19 0211 03/21/19 0229  WBC 11.2* 12.0* 14.1*  RBC 3.52* 3.42* 3.71*  HGB 10.8* 10.5* 11.4*  HCT 34.0* 32.8* 35.4*  MCV 96.6 95.9 95.4  MCH 30.7 30.7 30.7  MCHC 31.8 32.0 32.2  RDW 14.9 15.0 15.0  PLT 202 225 245    BNP Recent Labs  Lab 03/16/19 1157  BNP 434.8*     DDimer  Recent Labs  Lab 03/22/2019 2221  DDIMER 2.22*     Radiology    CT ANGIO CHEST PE W OR WO CONTRAST  Result Date: 03/25/2019 CLINICAL DATA:  Shortness of breath. EXAM: CT ANGIOGRAPHY CHEST WITH CONTRAST TECHNIQUE: Multidetector CT imaging of the chest was performed using the standard protocol during bolus administration of intravenous contrast. Multiplanar CT image reconstructions and MIPs were obtained to evaluate the vascular anatomy. CONTRAST:  133mL OMNIPAQUE IOHEXOL 350 MG/ML SOLN COMPARISON:  None. FINDINGS: Cardiovascular: Satisfactory opacification of the pulmonary arteries to the segmental level. No evidence of pulmonary embolism. Mild cardiomegaly is noted. Atherosclerosis of thoracic aorta is noted without aneurysm or dissection. No pericardial effusion. Mediastinum/Nodes: Large sliding-type hiatal hernia is noted. No adenopathy is noted. Thyroid gland is unremarkable. Lungs/Pleura: Large right pleural effusion is noted with complete atelectasis of  the right lower lobe and significant atelectasis involving the right upper lobe. Moderate left pleural effusion is noted with adjacent subsegmental atelectasis. No pneumothorax is noted. Left upper lobe airspace opacity is noted concerning for pneumonia. Upper Abdomen: No acute abnormality. Musculoskeletal: No chest wall abnormality. No acute or significant osseous findings. Review of the MIP images confirms the above findings. IMPRESSION: 1. No definite evidence of pulmonary embolus. 2. Large right pleural effusion is noted with complete atelectasis of the right lower lobe and significant atelectasis involving the right upper lobe. 3. Moderate left pleural effusion is noted with adjacent subsegmental atelectasis. 4. Left upper lobe airspace opacity is noted concerning for pneumonia. 5. Large sliding-type hiatal hernia is noted. Aortic Atherosclerosis (ICD10-I70.0). Electronically Signed   By: Marijo Conception M.D.   On: 03/16/2019 15:48   DG Chest Port 1 View  Result Date: 03/20/2019 CLINICAL DATA:  Post RIGHT side thoracentesis EXAM: PORTABLE CHEST 1 VIEW COMPARISON:  Portable exam 1149 hours compared to 03/17/2019 FINDINGS: RIGHT subclavian pacemaker leads project over RIGHT atrium and RIGHT ventricle unchanged. Enlargement of cardiac silhouette. Atherosclerotic calcification aorta. BILATERAL pulmonary infiltrates diffusely, significantly increased on LEFT since previous exam. Decreased RIGHT pleural effusion and basilar atelectasis post thoracentesis. No pneumothorax. Small LEFT pleural effusion. Bones demineralized. IMPRESSION: No pneumothorax following RIGHT thoracentesis. Diffuse BILATERAL pulmonary infiltrates question edema versus multifocal pneumonia, significantly increased on LEFT since prior study. Electronically Signed   By: Lavonia Dana M.D.   On: 03/20/2019 12:13   DG Swallowing Func-Speech Pathology  Result Date: 03/17/2019 Objective Swallowing Evaluation: Type of Study: MBS-Modified Barium  Swallow Study  Patient Details Name: Stanley Hunter MRN: [id number] Date of Birth: [birth year] Today's Date: 03/05/2019 Time: SLP Start Time (ACUTE ONLY): 0836 -SLP Stop Time (ACUTE ONLY): [id number] SLP Time Calculation (min) (ACUTE ONLY): 19 min Past Medical History: Past Medical History: Diagnosis Date  Acid reflux 08/03/2015  Anxiety 08/03/2015  Avitaminosis D 05/18/2012  Benign essential HTN 02/09/2009  Overview:  Benign Essential Hypertension   Benign prostatic hyperplasia with urinary obstruction 12/07/2013  Chronic kidney disease (CKD), stage III (moderate) 11/04/2011  CN (constipation) 08/03/2015  Cognitive changes 08/07/2015  Degenerative arthritis of hip 05/14/2013  Degenerative arthritis of spine 05/14/2013  Degenerative disorder of muscle 08/03/2015  Dysrhythmia 2001  ablation for a-flutter  H/O atrial flutter 08/03/2015  Overview:  Pacemaker placed 2015   High blood pressure   High cholesterol   History of pacemaker 2016  Hx of cardiac cath 2013  Loss of balance 08/07/2015  Lumbar canal stenosis 07/07/2013  Overview:  Lumbar laminectomy 07/07/13 - Dr. Lurene Shadow   Lumbar scoliosis 05/14/2013  Macular degeneration 08/07/2015  Premature contractions, supraventricular 12/15/2008  Overview:  Atrial Premature Complex   Presence of permanent cardiac pacemaker   Tremor 08/07/2015  hands  Urinary frequency 08/07/2015 Past Surgical History: Past Surgical History: Procedure Laterality Date  ATRIAL FLUTTER ABLATION  2001  CATARACT EXTRACTION Bilateral 2015  CHOLECYSTECTOMY    COLONOSCOPY    MASTOIDECTOMY  1934  NASAL SEPTOPLASTY W/ TURBINOPLASTY Bilateral 10/14/2018  Procedure: NASAL SEPTOPLASTY WITH BILATERAL  TURBINATE REDUCTION;  Surgeon: Leta Baptist, MD;  Location: [facility];  Service: ENT;  Laterality: Bilateral;  PACEMAKER PLACEMENT  2016  SPINE SURGERY  2016  LUMBER- lower  TONSILLECTOMY  1934  TRIGGER FINGER RELEASE Left 12/14/2015  Procedure: RELEASE TRIGGER FINGER/A-1 PULLEY ring finger left;  Surgeon: Daryll Brod,  MD;  Location: [place];  Service: Orthopedics;  Laterality: Left;  FAB HPI: 86 yo male adm [facility] 2 days previously with difficulty breathing and AMS. Pt found to have pna - CXR today showing progression which is concerning.  Swallow eval ordered.  Pt with PMH + for history significant of coronary artery disease status post. Cardiac catheterization, chronic kidney disease stage III, essential hypertension, degenerative disc joint disease, sinusitis s/p septum surgery in 2020, "Memory difficulty" per Dr Bubba Camp in snapshot, history of pacemaker placement 2015, GERD, who had his Covid vaccine 2 weeks prior to admission, Chest x-ray showed lobar pneumonia.  Concern for aspiration present and swallow eval ordered.  Subjective: pt awake in chair, reports his breathing is better Assessment / Plan / Recommendation CHL IP CLINICAL IMPRESSIONS 02/28/2019 Clinical Impression Patient presents with mild pharyngoesophageal dysphagia with decreased CP/UES relaxation/opening resulting in minimal backflow of barium to distal pharynx.  No aspiration or penetration observed and swallow was timely and strong.  Pt does appear with anterior protrusion of cervical spine at C3-C4 into pharynx which at times diminished epiglottic deflection.   Pt consumed thin via straw sequentially with adequate protection of airway.  Adequate laryngeal elevation/closure observed.  Of note, pt reported sensation of residuals in pharynx x1 after cracker bolus swallow.   Upon esophageal sweep after liquid consumption, he appeared with some retrograde propulsion of thin - ? due to dysmotility.  Recommend continue diet as tolerated being mindful of pt's work of breathing due to risk of impairing reciprocity of swallow/breathing. SLP to follow briefly for esophageal/respiratory precautions.  Thanks for this consult. SLP Visit Diagnosis Dysphagia, pharyngoesophageal phase (R13.14) Attention and concentration deficit following -- Frontal lobe and  executive function deficit following -- Impact on safety and function Mild aspiration risk   CHL IP TREATMENT RECOMMENDATION 03/17/2019 Treatment Recommendations Therapy as outlined in treatment plan below   Prognosis 02/26/2019 Prognosis for Safe Diet Advancement Fair Barriers to Reach Goals Other (Comment) Barriers/Prognosis Comment -- CHL IP DIET RECOMMENDATION 03/06/2019 SLP Diet Recommendations (No Data) Liquid Administration via -- Medication Administration (No Data) Compensations (No Data) Postural Changes Remain semi-upright after after feeds/meals (Comment);Seated upright at 90 degrees   CHL IP OTHER RECOMMENDATIONS 03/22/2019 Recommended  Consults -- Oral Care Recommendations Oral care before and after PO Other Recommendations Have oral suction available   CHL IP FOLLOW UP RECOMMENDATIONS 03/27/2019 Follow up Recommendations None   CHL IP FREQUENCY AND DURATION 03/03/2019 Speech Therapy Frequency (ACUTE ONLY) min 2x/week Treatment Duration 2 weeks      CHL IP ORAL PHASE 03/22/2019 Oral Phase Impaired Oral - Pudding Teaspoon -- Oral - Pudding Cup -- Oral - Honey Teaspoon -- Oral - Honey Cup -- Oral - Nectar Teaspoon NT Oral - Nectar Cup -- Oral - Nectar Straw WFL Oral - Thin Teaspoon WFL Oral - Thin Cup -- Oral - Thin Straw WFL;Piecemeal swallowing;Other (Comment) Oral - Puree WFL Oral - Mech Soft -- Oral - Regular WFL Oral - Multi-Consistency -- Oral - Pill -- Oral Phase - Comment --  CHL IP PHARYNGEAL PHASE 03/05/2019 Pharyngeal Phase Impaired Pharyngeal- Pudding Teaspoon -- Pharyngeal -- Pharyngeal- Pudding Cup -- Pharyngeal -- Pharyngeal- Honey Teaspoon -- Pharyngeal -- Pharyngeal- Honey Cup -- Pharyngeal -- Pharyngeal- Nectar Teaspoon -- Pharyngeal -- Pharyngeal- Nectar Cup -- Pharyngeal -- Pharyngeal- Nectar Straw WFL Pharyngeal Material does not enter airway Pharyngeal- Thin Teaspoon WFL Pharyngeal Material does not enter airway Pharyngeal- Thin Cup -- Pharyngeal -- Pharyngeal- Thin Straw WFL Pharyngeal  Material does not enter airway Pharyngeal- Puree WFL Pharyngeal Material does not enter airway Pharyngeal- Mechanical Soft -- Pharyngeal -- Pharyngeal- Regular WFL Pharyngeal Material does not enter airway Pharyngeal- Multi-consistency -- Pharyngeal -- Pharyngeal- Pill -- Pharyngeal -- Pharyngeal Comment --  CHL IP CERVICAL ESOPHAGEAL PHASE 03/05/2019 Cervical Esophageal Phase Impaired Pudding Teaspoon -- Pudding Cup -- Honey Teaspoon -- Honey Cup -- Nectar Teaspoon -- Nectar Cup -- Nectar Straw Reduced cricopharyngeal relaxation;Esophageal backflow into the pharynx;Prominent cricopharyngeal segment Thin Teaspoon Reduced cricopharyngeal relaxation;Esophageal backflow into the pharynx;Prominent cricopharyngeal segment Thin Cup -- Thin Straw Reduced cricopharyngeal relaxation;Esophageal backflow into the pharynx;Prominent cricopharyngeal segment Puree Reduced cricopharyngeal relaxation;Esophageal backflow into the pharynx;Prominent cricopharyngeal segment Mechanical Soft -- Regular Reduced cricopharyngeal relaxation;Esophageal backflow into the pharynx;Prominent cricopharyngeal segment Multi-consistency -- Pill -- Cervical Esophageal Comment -- Kathleen Lime, MS [facility] SLP Acute Rehab Services Office [phone] Macario Golds 03/07/2019, 9:54 AM              US THORACENTESIS ASP PLEURAL SPACE W/IMG GUIDE  Result Date: 03/20/2019 INDICATION: Shortness of breath, pneumonia. Right-sided pleural effusion. Request diagnostic and therapeutic thoracentesis. EXAM: ULTRASOUND GUIDED RIGHT THORACENTESIS MEDICATIONS: None. COMPLICATIONS: None immediate. PROCEDURE: An ultrasound guided thoracentesis was thoroughly discussed with the patient and questions answered. The benefits, risks, alternatives and complications were also discussed. The patient understands and wishes to proceed with the procedure. Written consent was obtained. Ultrasound was performed to localize and mark an adequate pocket of fluid in the right chest. The  area was then prepped and draped in the normal sterile fashion. 1% Lidocaine was used for local anesthesia. Under ultrasound guidance a 6 Fr Safe-T-Centesis catheter was introduced. Thoracentesis was performed. The catheter was removed and a dressing applied. FINDINGS: A total of approximately 1.1 L of hazy, amber fluid was removed. Samples were sent to the laboratory as requested by the clinical team. IMPRESSION: Successful ultrasound guided right thoracentesis yielding 1.1 L of pleural fluid. Read by: Ascencion Dike PA-C Electronically Signed   By: Markus Daft M.D.   On: 03/20/2019 12:10    Patient Profile     86 y.o. male with a hx of atrial flutter s/p ablation, sick sinus syndrome status postMedtronicpacemaker, symptomatic PVC on Rythmol, hypertension, chronic kidney disease  stage III and hyperlipidemia now admitted with hypoxia and PNA. Consulted for atrial fibrillation.  Assessment & Plan    1 paroxysmal atrial fibrillation-patient remains in sinus rhythm.  Continue Cardizem at present dose.  Continue propafenone.  Continue apixaban.    2 pneumonia/respiratory failure-continue antibiotics.  Status post thoracentesis on the right.  Question component of CHF.  Continue Lasix 40 mg daily.  Follow renal function.  3 prior pacemaker  4 hypertension-continue Cardizem.  For questions or updates, please contact [facility] Please consult [email or web address] for contact info under        Signed, Kirk Ruths, MD  03/21/2019, 7:33 AM

## 2019-03-21 NOTE — Evaluation (Signed)
Occupational Therapy Evaluation Patient Details Name: Stanley Hunter MRN: [id number] DOB: 03/29/1924 Today's Date: 03/21/2019    History of Present Illness 86 yo male presenting with difficulty breathing and AMS. Chest x-ray showing lobar pneumonia. PMH including CAD s/p cardiac cath, chronic kidney disease stage III, essential HTN, degenerative disc joint disease, sinusitis s/p septum surgery in 2020, pacemaker placement 2015, and GERD. Pt having COVID vaccine 2 weeks prior to admission.   Clinical Impression   PTA, pt was living with his wife at [place] and performed BADLs and used a SPC/RW for mobility. Pt currently requiring Mod-Max A for UB ADLs, Max A for LB ADLs, and Max A for functional transfer. Pt presenting with poor strength, arousal, and activity tolerance. Pt tolerating sitting at EOB for grooming and to talk with his wife; demonstrating increased arousal while upright at EOB. Pt requiring Min guard A for safety and SpO2 maintaining in 90s on 15L via HFNC and NRB. Pt would benefit from further acute OT to facilitate safe dc. Recommend dc to SNF for further OT to optimize safety, independence with ADLs, and return to PLOF.      Follow Up Recommendations  SNF;Supervision/Assistance - 24 hour(Family planning for dc to [facility] for SNF)    Equipment Recommendations  Other (comment)(Defer to next venue)    Recommendations for Other Services PT consult     Precautions / Restrictions Precautions Precautions: Fall;Other (comment) Precaution Comments: Watch SpO2      Mobility Bed Mobility Overal bed mobility: Needs Assistance Bed Mobility: Supine to Sit;Sit to Supine     Supine to sit: Mod assist;+2 for safety/equipment;HOB elevated Sit to supine: Max assist;+2 for physical assistance;+2 for safety/equipment   General bed mobility comments: Pt requiring Min A to initate bringing BLEs towards EOB. Pt then requiring Mod A to facilitate hips towards EOB with bed  pad and elevate trunk. Max A +2 to return to support   Transfers Overall transfer level: Needs assistance Equipment used: 1 person hand held assist;None Transfers: Sit to/from Stand Sit to Stand: Max assist;+2 safety/equipment;From elevated surface         General transfer comment: Max A to power up into standing and maintain standing. Once in standing, pt with moments where he would straighten and correct posture requiring less assistance, but would fatigues quickly    Balance Overall balance assessment: Needs assistance Sitting-balance support: No upper extremity supported;Feet supported Sitting balance-Leahy Scale: Fair Sitting balance - Comments: sitting at EOB with Min guard A   Standing balance support: No upper extremity supported;During functional activity Standing balance-Leahy Scale: Zero Standing balance comment: Reliant on Max A for maintaining standing                           ADL either performed or assessed with clinical judgement   ADL Overall ADL's : Needs assistance/impaired Eating/Feeding: Moderate assistance;Sitting;Bed level   Grooming: Wash/dry hands;Moderate assistance;Sitting Grooming Details (indicate cue type and reason): Pt initating bringing wash clothe to his face with BUEs but requiring Mod A and support at bilateral elbows to each his face. Doffed NRB and pt maintained Spo2 in 90s with 15L via HFNC Upper Body Bathing: Sitting;Bed level;Maximal assistance   Lower Body Bathing: Maximal assistance;Sit to/from stand;Bed level   Upper Body Dressing : Moderate assistance;Sitting;Bed level;Maximal assistance   Lower Body Dressing: Maximal assistance;Total assistance;Sit to/from stand;Bed level   Toilet Transfer: Total assistance   Toileting- Clothing Manipulation and Hygiene:  Total assistance       Functional mobility during ADLs: Maximal assistance(sit<>stand ) General ADL Comments: Pt with decreased functional performance due to  decreased strength and activity tolerance.      Vision Baseline Vision/History: Wears glasses Wears Glasses: At all times Patient Visual Report: No change from baseline       Perception     Praxis      Pertinent Vitals/Pain Pain Assessment: Faces Faces Pain Scale: No hurt Pain Intervention(s): Monitored during session     Hand Dominance Right   Extremity/Trunk Assessment Upper Extremity Assessment Upper Extremity Assessment: RUE deficits/detail;LUE deficits/detail RUE Deficits / Details: WFL grasp strength. Decreased gross motor strength with decreased shoulder ROM. Able to attempt bringing wash clothe to face with BUEs but requiring assistance and support at elbows due to weakness RUE Coordination: decreased fine motor;decreased gross motor LUE Deficits / Details: Same as right.  LUE Coordination: decreased fine motor;decreased gross motor   Lower Extremity Assessment Lower Extremity Assessment: Defer to PT evaluation   Cervical / Trunk Assessment Cervical / Trunk Assessment: Other exceptions Cervical / Trunk Exceptions: Prior "back problems" per wife   Communication Communication Communication: Other (comment)(arousal level)   Cognition Arousal/Alertness: Lethargic;Suspect due to medications(Zanex last night for sleeping) Behavior During Therapy: Flat affect;WFL for tasks assessed/performed Overall Cognitive Status: Impaired/Different from baseline Area of Impairment: Following commands;Problem solving                       Following Commands: Follows one step commands with increased time;Follows one step commands inconsistently     Problem Solving: Slow processing;Requires verbal cues General Comments: Pt able to state he is at the hospital. Very lethargic supine in bed. Once seated at EOB, pt opening his eyes more and participating in conversation with OT, RN, and his wife. Appreciative to sit upright at EOB.    General Comments  SpO2 99-88% on 15L via  HFNC and NRB. WHIle seated at EOB, SPO2 maintained in 90s on 15L via HFNC and NBR. No signs of distress. Wife present throughout    Exercises     Shoulder Instructions      Home Living Family/patient expects to be discharged to:: Private residence Living Arrangements: Spouse/significant other Available Help at Discharge: Family;Available 24 hours/day Type of Home: Independent living facility([facility]) Home Access: Stairs to enter [employer] of Steps: 1(Back entrance) Entrance Stairs-Rails: None Home Layout: One level     Bathroom Shower/Tub: Occupational psychologist: Handicapped height Bathroom Accessibility: No   Home Equipment: Environmental consultant - 2 wheels;Walker - 4 wheels;Shower seat - built in;Grab bars - tub/shower;Hand held shower head   Additional Comments: Information collected from wife as she was present      Prior Functioning/Environment Level of Independence: Independent with assistive device(s)        Comments: Uses a SPC in the house and RW in community.  ADLs and light IADLs (standing for long periods of time is difficult on his back). House keeping services for IADLs.         OT Problem List: Decreased strength;Decreased activity tolerance;Decreased range of motion;Impaired balance (sitting and/or standing);Decreased knowledge of use of DME or AE;Decreased knowledge of precautions;Cardiopulmonary status limiting activity      OT Treatment/Interventions: Self-care/ADL training;Therapeutic exercise;Energy conservation;DME and/or AE instruction;Therapeutic activities;Patient/family education    OT Goals(Current goals can be found in the care plan section) Acute Rehab OT Goals Patient Stated Goal: Wife, " for him to  get stronger" OT Goal Formulation: With family Time For Goal Achievement: 04/04/19 Potential to Achieve Goals: Good  OT Frequency: Min 2X/week   Barriers to D/C:            Co-evaluation               AM-PAC OT "6 Clicks" Daily Activity     Outcome Measure Help from another person eating meals?: A Lot Help from another person taking care of personal grooming?: A Lot Help from another person toileting, which includes using toliet, bedpan, or urinal?: Total Help from another person bathing (including washing, rinsing, drying)?: A Lot Help from another person to put on and taking off regular upper body clothing?: A Lot Help from another person to put on and taking off regular lower body clothing?: A Lot 6 Click Score: 11   End of Session Equipment Utilized During Treatment: Oxygen(15L via HFNC and NRB) Nurse Communication: Mobility status  Activity Tolerance: Patient tolerated treatment well;Patient limited by fatigue;Patient limited by lethargy Patient left: in bed;with call bell/phone within reach;with nursing/sitter in room;with family/visitor present  OT Visit Diagnosis: Unsteadiness on feet (R26.81);Other abnormalities of gait and mobility (R26.89);Muscle weakness (generalized) (M62.81)                Time: [id number] OT Time Calculation (min): 27 min Charges:  OT General Charges $OT Visit: 1 Visit OT Evaluation $OT Eval Moderate Complexity: 1 Mod OT Treatments $Self Care/Home Management : 8-22 mins  Priyana Mccarey MSOT, OTR/L Acute Rehab Pager: [phone] Office: [place] 03/21/2019, 12:13 PM

## 2019-03-21 NOTE — Progress Notes (Addendum)
PROGRESS NOTE  Stanley Hunter [id number] DOB: 01-Oct-1924 DOA: 03/25/2019 PCP: Mast, Man X, NP  HPI/Recap of past 24 hours: HPI from Dr Ladona Ridgel is a 86 y.o. male with medical history significant of coronary artery disease status post. Cardiac catheterization, chronic kidney disease stage III, essential hypertension, degenerative disc joint disease, history of pacemaker placement 2015, who had his Covid vaccine 2 weeks ago and presents with shortness of breath, cough and fever. Patient also has lower extremity edema. He was found to be hypoxic. Chest x-ray showed lobar pneumonia. In the ED, patient noted to be hypoxic, Covid test negative.  Patient admitted for further management    Today, patient still appears lethargic, confused, still requiring about 15 L of high flow nasal cannula, no much improvement status post thoracentesis.   Assessment/Plan: Principal Problem:   CAP (community acquired pneumonia) Active Problems:   Pure hypercholesterolemia   History of pacemaker   Hypertensive heart and renal disease   Anemia of chronic disease   Hyperlipidemia   Stage 3b chronic kidney disease   Persistent atrial fibrillation (HCC)   Palliative care encounter   Acute hypoxic respiratory failure likely 2/2 CAP complicated by large R pleural effusion Currently still requiring more O2, currently at about 15L Afebrile, with leukocytosis Urine strep pneumo negative, Legionella negative COVID-19, influenza panel negative ABG with hypoxia Initial chest x-ray with right basilar infiltrate CTA chest showed no PE, large right pleural effusion with complete atelectasis involving the right upper lobe, moderate left pleural effusion with left upper lobe airspace opacity Status post US guided right thoracentesis, which yielded 1.1 L of hazy amber fluid, likely exudative, repeat chest x-ray with no pneumothorax, diffuse bilateral pulmonary infiltrates possibly edema versus multifocal  pneumonia SLP on board S/P ceftriaxone/IV Unasyn, start cefepime, continue azithromycin, vancomycin (MRSA PCR +) Daily IV lasix 40 mg once DuoNeb, Supplemental O2 as needed Monitor closely  A. fib with RVR HR with better control Had previous flutter ablation, pacemaker implantation secondary to SSS Venous Dopplers lower extremity negative for DVT Cardiology on board, hold off on cardioversion S/p Cardizem drip, switch to p.o Cardizem, continue propafenone Continue Eliquis  ??UTI Urine culture grew Staphylococcus warneri, 2000 colonies Continue vancomycin as above  ?CHF Elevated BNP, 434.8 Echo with EF of 65 to 70%, no regional wall motion abnormality Continue IV Lasix Strict I's and O's, daily weights  AKI on CKD stage IIIb Baseline creatinine 1.9 Daily BMP  Hypertension Hold home Norvasc Continue diltiazem  Hyperlipidemia Continue statins  GERD Continue PPI  BPH Continue Flomax  GOC Very poor prognosis, palliative consulted Discussed with wife on 03/20/2019, wants to continue present management        Malnutrition Type:      Malnutrition Characteristics:      Nutrition Interventions:       Estimated body mass index is 24.12 kg/m as calculated from the following:   Height as of this encounter: 5\' 9"  (1.753 m).   Weight as of this encounter: 74.1 kg.     Code Status: DNR  Family Communication: Discussed extensively with wife and daughter on 03/21/19, want to give patient another day, if no significant improvement, will switch to comfort care  Disposition Plan: Likely hospice   Consultants:  Cardiology  Palliative  Procedures:  None  Antimicrobials:  Azithromycin  Cefepime  Vancomycin  DVT prophylaxis: Eliquis   Objective: Vitals:   03/21/19 0820 03/21/19 0829 03/21/19 1027 03/21/19 1200  BP:   (!) 135/45 (!) 132/42  Pulse:    (!) 59  Resp:    (!) 22  Temp:  (!) 96.2 F (35.7 C)  (!) 96.4 F (35.8 C)  TempSrc:   Axillary  Axillary  SpO2: 95%   (!) 89%  Weight:      Height:        Intake/Output Summary (Last 24 hours) at 03/21/2019 1425 Last data filed at 03/21/2019 1331 Gross per 24 hour  Intake 620 ml  Output 2200 ml  Net -1580 ml   Filed Weights   03/16/19 0222 03/16/19 1653  Weight: 75.8 kg 74.1 kg    Exam:  General:  Mod distress, lethargic, deconditioned, confused  Cardiovascular: S1, S2 present  Respiratory:  Coarse breath sounds bilaterally  Abdomen: Soft, nontender, nondistended, bowel sounds present  Musculoskeletal: No bilateral pedal edema noted  Skin: Normal  Psychiatry:  Unable to assess    Data Reviewed: CBC: Recent Labs  Lab 03/12/2019 2221 03/05/2019 2221 03/16/19 1115 03/17/19 0129 03/07/2019 0147 03/20/19 0211 03/21/19 0229  WBC 11.9*   < > 12.2* 10.5 11.2* 12.0* 14.1*  NEUTROABS 8.1*  --   --   --  8.8* 9.6* 11.8*  HGB 11.4*   < > 11.6* 11.3* 10.8* 10.5* 11.4*  HCT 35.1*   < > 35.6* 35.6* 34.0* 32.8* 35.4*  MCV 96.2   < > 96.0 96.2 96.6 95.9 95.4  PLT 181   < > 184 199 202 225 245   < > = values in this interval not displayed.   Basic Metabolic Panel: Recent Labs  Lab 03/17/19 0129 03/18/19 0127 03/25/2019 0147 03/20/19 0211 03/21/19 0229  NA 139 140 142 142 144  K 3.9 3.9 3.8 3.7 3.5  CL 109 109 111 111 111  CO2 19* 20* 20* 20* 22  GLUCOSE 113* 134* 141* 129* 130*  BUN 26* 28* 26* 30* 27*  CREATININE 1.33* 1.32* 1.26* 1.55* 1.50*  CALCIUM 8.0* 8.3* 8.1* 8.1* 8.1*  MG 2.0  --   --   --   --    GFR: Estimated Creatinine Clearance: 31.4 mL/min (A) (by C-G formula based on SCr of 1.5 mg/dL (H)). Liver Function Tests: Recent Labs  Lab 03/20/2019 2221  AST 21  ALT 17  ALKPHOS 78  BILITOT 1.0  PROT 6.4*  ALBUMIN 2.8*   No results for input(s): LIPASE, AMYLASE in the last 168 hours. No results for input(s): AMMONIA in the last 168 hours. Coagulation Profile: Recent Labs  Lab 03/05/2019 2221  INR 1.4*   Cardiac Enzymes: No results  for input(s): CKTOTAL, CKMB, CKMBINDEX, TROPONINI in the last 168 hours. BNP (last 3 results) No results for input(s): PROBNP in the last 8760 hours. HbA1C: No results for input(s): HGBA1C in the last 72 hours. CBG: No results for input(s): GLUCAP in the last 168 hours. Lipid Profile: No results for input(s): CHOL, HDL, LDLCALC, TRIG, CHOLHDL, LDLDIRECT in the last 72 hours. Thyroid Function Tests: No results for input(s): TSH, T4TOTAL, FREET4, T3FREE, THYROIDAB in the last 72 hours. Anemia Panel: No results for input(s): VITAMINB12, FOLATE, FERRITIN, TIBC, IRON, RETICCTPCT in the last 72 hours. Urine analysis:    Component Value Date/Time   COLORURINE YELLOW 03/16/2019 0222   APPEARANCEUR HAZY (A) 03/16/2019 0222   LABSPEC 1.024 03/16/2019 0222   PHURINE 5.0 03/16/2019 0222   GLUCOSEU NEGATIVE 03/16/2019 0222   HGBUR NEGATIVE 03/16/2019 0222   BILIRUBINUR NEGATIVE 03/16/2019 0222   KETONESUR 5 (A) 03/16/2019 0222   PROTEINUR 30 (A) 03/16/2019 0222  NITRITE NEGATIVE 03/16/2019 0222   LEUKOCYTESUR NEGATIVE 03/16/2019 0222   Sepsis Labs: @LABRCNTIP (procalcitonin:4,lacticidven:4)  ) Recent Results (from the past 240 hour(s))  Blood Culture (routine x 2)     Status: None   Collection Time: 03/04/2019 10:21 PM   Specimen: BLOOD  Result Value Ref Range Status   Specimen Description   Final    BLOOD BLOOD RIGHT ARM Performed at [facility] [address], [place], [place] [address]    Special Requests   Final    BOTTLES DRAWN AEROBIC AND ANAEROBIC Blood Culture adequate volume Performed at [facility] [address] [place], [place] [address]    Culture   Final    NO GROWTH 5 DAYS Performed at [facility], [place] [address], [place], [place] [address]    Report Status 03/21/2019 FINAL  Final  Blood Culture (routine x 2)     Status: Abnormal   Collection Time: 03/16/2019 10:26 PM   Specimen: BLOOD  Result Value Ref Range Status     Specimen Description   Final    BLOOD BLOOD LEFT HAND Performed at [facility] [address] [place], [place] [address]    Special Requests   Final    BOTTLES DRAWN AEROBIC AND ANAEROBIC Blood Culture adequate volume Performed at [facility] [address], [place], [place] [address]    Culture  Setup Time   Final    ANAEROBIC BOTTLE ONLY GRAM POSITIVE COCCI CRITICAL RESULT CALLED TO, READ BACK BY AND VERIFIED WITH: B GREEN PHARMD 03/17/19 0137 JDW    Culture (A)  Final    STAPHYLOCOCCUS SPECIES (COAGULASE NEGATIVE) THE SIGNIFICANCE OF ISOLATING THIS ORGANISM FROM A SINGLE SET OF BLOOD CULTURES WHEN MULTIPLE SETS ARE DRAWN IS UNCERTAIN. PLEASE NOTIFY THE MICROBIOLOGY DEPARTMENT WITHIN ONE WEEK IF SPECIATION AND SENSITIVITIES ARE REQUIRED. Performed at [facility], [place] [address], [place], [place] [address]    Report Status 02/26/2019 FINAL  Final  Respiratory Panel by RT PCR (Flu A&B, Covid) - Nasopharyngeal Swab     Status: None   Collection Time: 03/09/2019 11:01 PM   Specimen: Nasopharyngeal Swab  Result Value Ref Range Status   SARS Coronavirus 2 by RT PCR NEGATIVE NEGATIVE Final    Comment: (NOTE) SARS-CoV-2 target nucleic acids are NOT DETECTED. The SARS-CoV-2 RNA is generally detectable in upper respiratoy specimens during the acute phase of infection. The lowest concentration of SARS-CoV-2 viral copies this assay can detect is 131 copies/mL. A negative result does not preclude SARS-Cov-2 infection and should not be used as the sole basis for treatment or other patient management decisions. A negative result may occur with  improper specimen collection/handling, submission of specimen other than nasopharyngeal swab, presence of viral mutation(s) within the areas targeted by this assay, and inadequate number of viral copies (<131 copies/mL). A negative result must be combined with clinical observations, patient history, and  epidemiological information. The expected result is Negative. Fact Sheet for Patients:  PinkCheek.be Fact Sheet for Healthcare Providers:  GravelBags.it This test is not yet ap proved or cleared by the [place] FDA and  has been authorized for detection and/or diagnosis of SARS-CoV-2 by FDA under an Emergency Use Authorization (EUA). This EUA will remain  in effect (meaning this test can be used) for the duration of the COVID-19 declaration under Section 564(b)(1) of the Act, 21 U.S.C. section 360bbb-3(b)(1), unless the authorization is terminated or revoked sooner.  Influenza A by PCR NEGATIVE NEGATIVE Final   Influenza B by PCR NEGATIVE NEGATIVE Final    Comment: (NOTE) The Xpert Xpress SARS-CoV-2/FLU/RSV assay is intended as an aid in  the diagnosis of influenza from Nasopharyngeal swab specimens and  should not be used as a sole basis for treatment. Nasal washings and  aspirates are unacceptable for Xpert Xpress SARS-CoV-2/FLU/RSV  testing. Fact Sheet for Patients: PinkCheek.be Fact Sheet for Healthcare Providers: GravelBags.it This test is not yet approved or cleared by the [place] FDA and  has been authorized for detection and/or diagnosis of SARS-CoV-2 by  FDA under an Emergency Use Authorization (EUA). This EUA will remain  in effect (meaning this test can be used) for the duration of the  Covid-19 declaration under Section 564(b)(1) of the Act, 21  U.S.C. section 360bbb-3(b)(1), unless the authorization is  terminated or revoked. Performed at [facility], [place] [address], [place], [place] [address]   Urine culture     Status: Abnormal   Collection Time: 03/16/19  2:22 AM   Specimen: In/Out Cath Urine  Result Value Ref Range Status   Specimen Description   Final    IN/OUT CATH URINE Performed at [facility] [address], [place], [place] [address]    Special Requests   Final    NONE Performed at [facility], [place] [address] [place], [place] [address]    Culture 2,000 COLONIES/mL STAPHYLOCOCCUS WARNERI (A)  Final   Report Status 03/17/2019 FINAL  Final   Organism ID, Bacteria STAPHYLOCOCCUS WARNERI (A)  Final      Susceptibility   Staphylococcus warneri - MIC*    CIPROFLOXACIN <=0.5 SENSITIVE Sensitive     GENTAMICIN <=0.5 SENSITIVE Sensitive     NITROFURANTOIN 32 SENSITIVE Sensitive     OXACILLIN <=0.25 SENSITIVE Sensitive     TETRACYCLINE >=16 RESISTANT Resistant     VANCOMYCIN <=0.5 SENSITIVE Sensitive     TRIMETH/SULFA <=10 SENSITIVE Sensitive     CLINDAMYCIN RESISTANT Resistant     RIFAMPIN <=0.5 SENSITIVE Sensitive     Inducible Clindamycin POSITIVE Resistant     * 2,000 COLONIES/mL STAPHYLOCOCCUS WARNERI  MRSA PCR Screening     Status: Abnormal   Collection Time: 03/16/19  4:53 PM   Specimen: Nasopharyngeal  Result Value Ref Range Status   MRSA by PCR POSITIVE (A) NEGATIVE Final    Comment:        The GeneXpert MRSA Assay (FDA approved for NASAL specimens only), is one component of a comprehensive MRSA colonization surveillance program. It is not intended to diagnose MRSA infection nor to guide or monitor treatment for MRSA infections. RESULT CALLED TO, READ BACK BY AND VERIFIED WITH: GARLAND,G @ 2020 ON EH:[id number] BY POTEAT,S Performed at [facility], [place] [address], [place], [place] [address]   Culture, blood (routine x 2) Call MD if unable to obtain prior to antibiotics being given     Status: None   Collection Time: 03/16/19  5:59 PM   Specimen: BLOOD RIGHT ARM  Result Value Ref Range Status   Specimen Description   Final    BLOOD RIGHT ARM Performed at [facility] [address], [place], [place] [address]    Special Requests   Final    BOTTLES DRAWN AEROBIC ONLY Blood Culture adequate  volume Performed at [facility] [address] [place], [place] [address]    Culture   Final  NO GROWTH 5 DAYS Performed at [facility], [place] [address] [place], [place] [address]    Report Status 03/21/2019 FINAL  Final  Culture, blood (routine x 2) Call MD if unable to obtain prior to antibiotics being given     Status: None   Collection Time: 03/16/19  5:59 PM   Specimen: BLOOD  Result Value Ref Range Status   Specimen Description   Final    BLOOD RIGHT ANTECUBITAL Performed at [facility] [address], [place], [place] [address]    Special Requests   Final    BOTTLES DRAWN AEROBIC AND ANAEROBIC Blood Culture adequate volume Performed at [facility] [address] [place], [place] [address]    Culture   Final    NO GROWTH 5 DAYS Performed at [facility], [place] [address], [place], [place] [address]    Report Status 03/21/2019 FINAL  Final  SARS CORONAVIRUS 2 (TAT 6-24 HRS) Nasopharyngeal Nasopharyngeal Swab     Status: None   Collection Time: 03/17/19  6:30 PM   Specimen: Nasopharyngeal Swab  Result Value Ref Range Status   SARS Coronavirus 2 NEGATIVE NEGATIVE Final    Comment: (NOTE) SARS-CoV-2 target nucleic acids are NOT DETECTED. The SARS-CoV-2 RNA is generally detectable in upper and lower respiratory specimens during the acute phase of infection. Negative results do not preclude SARS-CoV-2 infection, do not rule out co-infections with other pathogens, and should not be used as the sole basis for treatment or other patient management decisions. Negative results must be combined with clinical observations, patient history, and epidemiological information. The expected result is Negative. Fact Sheet for Patients: SugarRoll.be Fact Sheet for Healthcare Providers: https://www.woods-mathews.com/ This test is not yet approved or cleared by the [place] FDA and  has been authorized for detection and/or diagnosis of SARS-CoV-2 by FDA under an Emergency Use Authorization (EUA). This EUA will remain  in effect (meaning this test can be used) for the duration of the COVID-19 declaration under Section 56 4(b)(1) of the Act, 21 U.S.C. section 360bbb-3(b)(1), unless the authorization is terminated or revoked sooner. Performed at [facility], [place] [address] [place], [place] [address]   Body fluid culture     Status: None (Preliminary result)   Collection Time: 03/20/19 11:35 AM   Specimen: PATH Cytology Pleural fluid  Result Value Ref Range Status   Specimen Description   Final    PLEURAL Performed at [facility] [address] [place], [place] [address]    Special Requests   Final    NONE Performed at [facility], [place] [address] [place], [place] [address]    Gram Stain NO WBC SEEN NO ORGANISMS SEEN   Final   Culture   Final    NO GROWTH < 24 HOURS Performed at [facility], [place] [address], [place], [place] [address]    Report Status PENDING  Incomplete      Studies: No results found.  Scheduled Meds: . apixaban  5 mg Oral BID  . atorvastatin  20 mg Oral q1800  . azithromycin  500 mg Oral Daily  . Chlorhexidine Gluconate Cloth  6 each Topical Daily  . dextromethorphan-guaiFENesin  1 tablet Oral BID  . diltiazem  360 mg Oral Daily  . furosemide  40 mg Intravenous Daily  . levalbuterol  0.63 mg Nebulization Q6H  . mouth rinse  15 mL Mouth Rinse BID  .  pantoprazole  20 mg Oral Daily  . propafenone  225 mg Oral Q6H  . tamsulosin  0.4 mg Oral QHS    Continuous Infusions: . ceFEPime (MAXIPIME) IV Stopped (03/21/19 0630)  . vancomycin Stopped (03/20/19 1950)     LOS: 5 days     Alma Friendly, MD [facility]  If 7PM-7AM, please contact night-coverage [email or web address] 03/21/2019, 2:25 PM

## 2019-03-21 NOTE — Progress Notes (Addendum)
Patient had coughing episode after ingesting some water oxygen level dropped to 85% on HFNC and Non rebreather at 15 liters. Respiratory notified of situation and provided nebulizer tx and deep suction.Oxygen level currently  88-89%  Dr. Horris Latino  notified of situation.

## 2019-03-22 LAB — CBC WITH DIFFERENTIAL/PLATELET
Abs Immature Granulocytes: 0.18 10*3/uL — ABNORMAL HIGH (ref 0.00–0.07)
Basophils Absolute: 0 10*3/uL (ref 0.0–0.1)
Basophils Relative: 0 %
Eosinophils Absolute: 0 10*3/uL (ref 0.0–0.5)
Eosinophils Relative: 0 %
HCT: 35.6 % — ABNORMAL LOW (ref 39.0–52.0)
Hemoglobin: 11.6 g/dL — ABNORMAL LOW (ref 13.0–17.0)
Immature Granulocytes: 1 %
Lymphocytes Relative: 4 %
Lymphs Abs: 0.7 10*3/uL (ref 0.7–4.0)
MCH: 31.1 pg (ref 26.0–34.0)
MCHC: 32.6 g/dL (ref 30.0–36.0)
MCV: 95.4 fL (ref 80.0–100.0)
Monocytes Absolute: 1.6 10*3/uL — ABNORMAL HIGH (ref 0.1–1.0)
Monocytes Relative: 9 %
Neutro Abs: 16.1 10*3/uL — ABNORMAL HIGH (ref 1.7–7.7)
Neutrophils Relative %: 86 %
Platelets: 243 10*3/uL (ref 150–400)
RBC: 3.73 MIL/uL — ABNORMAL LOW (ref 4.22–5.81)
RDW: 15.1 % (ref 11.5–15.5)
WBC: 18.7 10*3/uL — ABNORMAL HIGH (ref 4.0–10.5)
nRBC: 0.1 % (ref 0.0–0.2)

## 2019-03-22 LAB — BASIC METABOLIC PANEL
Anion gap: 12 (ref 5–15)
BUN: 33 mg/dL — ABNORMAL HIGH (ref 8–23)
CO2: 22 mmol/L (ref 22–32)
Calcium: 8.1 mg/dL — ABNORMAL LOW (ref 8.9–10.3)
Chloride: 112 mmol/L — ABNORMAL HIGH (ref 98–111)
Creatinine, Ser: 1.78 mg/dL — ABNORMAL HIGH (ref 0.61–1.24)
GFR calc Af Amer: 38 mL/min — ABNORMAL LOW (ref >60)
GFR calc non Af Amer: 32 mL/min — ABNORMAL LOW (ref >60)
Glucose, Bld: 140 mg/dL — ABNORMAL HIGH (ref 70–99)
Potassium: 3.8 mmol/L (ref 3.5–5.1)
Sodium: 146 mmol/L — ABNORMAL HIGH (ref 135–145)

## 2019-03-22 LAB — ACID FAST SMEAR (AFB, MYCOBACTERIA): Acid Fast Smear: NEGATIVE

## 2019-03-22 LAB — PROCALCITONIN: Procalcitonin: 0.65 ng/mL

## 2019-03-22 MED ORDER — HALOPERIDOL LACTATE 5 MG/ML IJ SOLN: 0.5000 mg | INTRAMUSCULAR | Status: DC | PRN

## 2019-03-22 MED ORDER — MORPHINE 100MG IN NS 100ML (1MG/ML) PREMIX INFUSION
1.0000 mg/h | INTRAVENOUS | Status: DC
  Administered 2019-03-22: 1 mg/h via INTRAVENOUS
  Filled 2019-03-22: qty 100

## 2019-03-22 MED ORDER — VANCOMYCIN VARIABLE DOSE PER UNSTABLE RENAL FUNCTION (PHARMACIST DOSING): Status: DC

## 2019-03-22 MED ORDER — ACETAMINOPHEN 325 MG PO TABS: 650.0000 mg | ORAL_TABLET | Freq: Four times a day (QID) | ORAL | Status: DC | PRN

## 2019-03-22 MED ORDER — GLYCOPYRROLATE 0.2 MG/ML IJ SOLN: 0.2000 mg | INTRAMUSCULAR | Status: DC | PRN

## 2019-03-22 MED ORDER — MORPHINE BOLUS VIA INFUSION
1.0000 mg | INTRAVENOUS | Status: DC | PRN
  Administered 2019-03-22 (×4): 1 mg via INTRAVENOUS
  Filled 2019-03-22: qty 1

## 2019-03-22 MED ORDER — HALOPERIDOL LACTATE 2 MG/ML PO CONC
0.5000 mg | ORAL | Status: DC | PRN
  Filled 2019-03-22: qty 0.3

## 2019-03-22 MED ORDER — LORAZEPAM 2 MG/ML IJ SOLN
1.0000 mg | Freq: Once | INTRAMUSCULAR | Status: AC | PRN
  Administered 2019-03-22: 1 mg via INTRAVENOUS
  Filled 2019-03-22: qty 1

## 2019-03-22 MED ORDER — HALOPERIDOL 1 MG PO TABS: 0.5000 mg | ORAL_TABLET | ORAL | Status: DC | PRN

## 2019-03-22 MED ORDER — LORAZEPAM 2 MG/ML PO CONC: 1.0000 mg | ORAL | Status: DC | PRN

## 2019-03-22 MED ORDER — ONDANSETRON HCL 4 MG/2ML IJ SOLN: 4.0000 mg | Freq: Four times a day (QID) | INTRAMUSCULAR | Status: DC | PRN

## 2019-03-22 MED ORDER — BIOTENE DRY MOUTH MT LIQD: 15.0000 mL | OROMUCOSAL | Status: DC | PRN

## 2019-03-22 MED ORDER — LORAZEPAM 1 MG PO TABS: 1.0000 mg | ORAL_TABLET | ORAL | Status: DC | PRN

## 2019-03-22 MED ORDER — ACETAMINOPHEN 650 MG RE SUPP: 650.0000 mg | Freq: Four times a day (QID) | RECTAL | Status: DC | PRN

## 2019-03-22 MED ORDER — LORAZEPAM 2 MG/ML IJ SOLN
1.0000 mg | INTRAMUSCULAR | Status: DC | PRN
  Administered 2019-03-22: 17:00:00 1 mg via INTRAVENOUS
  Filled 2019-03-22: qty 1

## 2019-03-22 MED ORDER — GLYCOPYRROLATE 1 MG PO TABS: 1.0000 mg | ORAL_TABLET | ORAL | Status: DC | PRN

## 2019-03-22 MED ORDER — SODIUM CHLORIDE 0.9 % IV SOLN
12.5000 mg | Freq: Four times a day (QID) | INTRAVENOUS | Status: DC | PRN
  Filled 2019-03-22: qty 0.5

## 2019-03-22 MED ORDER — SODIUM CHLORIDE 0.9 % IV SOLN: 2.0000 g | INTRAVENOUS | Status: DC

## 2019-03-22 MED ORDER — POLYVINYL ALCOHOL 1.4 % OP SOLN
1.0000 [drp] | Freq: Four times a day (QID) | OPHTHALMIC | Status: DC | PRN
  Filled 2019-03-22: qty 15

## 2019-03-22 MED ORDER — ONDANSETRON 4 MG PO TBDP: 4.0000 mg | ORAL_TABLET | Freq: Four times a day (QID) | ORAL | Status: DC | PRN

## 2019-03-22 MED ORDER — DIPHENHYDRAMINE HCL 50 MG/ML IJ SOLN: 12.5000 mg | INTRAMUSCULAR | Status: DC | PRN

## 2019-03-22 NOTE — Progress Notes (Signed)
Palliative Medicine    Name: Stanley Hunter Date: 03/22/2019 MRN: [id number]  DOB: September 24, 1924  Patient Care Team: Mast, Man X, NP as PCP - General (Internal Medicine) Georg Ruddle, Ashok Cordia, MD as PCP - Cardiology (Cardiology) Mast, Man X, NP as Nurse Practitioner (Internal Medicine)    REASON FOR CONSULTATION: Stanley Hunter is a 86 y.o. male with multiple medical problems including coronary artery disease status post. Cardiac catheterization, chronic kidney disease stage III, essential hypertension, degenerative disc joint disease, history of pacemaker placement 2015, who had his Covid vaccine2 weeks agoand presents with shortness of breath, cough and fever. Patient also has lower extremity edema. He was found to be hypoxic. Chest x-ray showed lobar pneumonia..   SOCIAL HISTORY:     reports that he quit smoking about 43 years ago. His smoking use included cigarettes. He quit after 50.00 years of use. He has never used smokeless tobacco. He reports current alcohol use of about 3.0 standard drinks of alcohol per week. He reports that he does not use drugs.   Patient is married to his wife of 57 years.  They live together in a cottage at [facility].  They have a daughter in [place], another daughter in [place], and a son in [place].  Patient is originally from [place] and moved here for retirement.  He worked as a Radiation protection practitioner.  ADVANCE DIRECTIVES:  On file dated 11/20/2015  CODE STATUS: DNR  PAST MEDICAL HISTORY: Past Medical History:  Diagnosis Date   Acid reflux 08/03/2015   Anxiety 08/03/2015   Avitaminosis D 05/18/2012   Benign essential HTN 02/09/2009   Overview:  Benign Essential Hypertension    Benign prostatic hyperplasia with urinary obstruction 12/07/2013   Chronic kidney disease (CKD), stage III (moderate) 11/04/2011   CN (constipation) 08/03/2015   Cognitive changes 08/07/2015   Degenerative arthritis of hip 05/14/2013   Degenerative arthritis of  spine 05/14/2013   Degenerative disorder of muscle 08/03/2015   Dysrhythmia 2001   ablation for a-flutter   H/O atrial flutter 08/03/2015   Overview:  Pacemaker placed 2015    High blood pressure    High cholesterol    History of pacemaker 2016   Hx of cardiac cath 2013   Loss of balance 08/07/2015   Lumbar canal stenosis 07/07/2013   Overview:  Lumbar laminectomy 07/07/13 - Dr. Lurene Shadow    Lumbar scoliosis 05/14/2013   Macular degeneration 08/07/2015   Premature contractions, supraventricular 12/15/2008   Overview:  Atrial Premature Complex    Presence of permanent cardiac pacemaker    Tremor 08/07/2015   hands   Urinary frequency 08/07/2015    PAST SURGICAL HISTORY:  Past Surgical History:  Procedure Laterality Date   ATRIAL FLUTTER ABLATION  2001   CATARACT EXTRACTION Bilateral 2015   CHOLECYSTECTOMY     COLONOSCOPY     MASTOIDECTOMY  1934   NASAL SEPTOPLASTY W/ TURBINOPLASTY Bilateral 10/14/2018   Procedure: NASAL SEPTOPLASTY WITH BILATERAL  TURBINATE REDUCTION;  Surgeon: Leta Baptist, MD;  Location: [place] [facility];  Service: ENT;  Laterality: Bilateral;   PACEMAKER PLACEMENT  2016   SPINE SURGERY  2016   LUMBER- lower   TONSILLECTOMY  1934   TRIGGER FINGER RELEASE Left 12/14/2015   Procedure: RELEASE TRIGGER FINGER/A-1 PULLEY ring finger left;  Surgeon: Daryll Brod, MD;  Location: [place];  Service: Orthopedics;  Laterality: Left;  FAB    HEMATOLOGY/ONCOLOGY HISTORY:  Oncology History   No history exists.  ALLERGIES:  is allergic to cortisone; neomycin; and sulfamethoxazole-trimethoprim.  MEDICATIONS:  Current Facility-Administered Medications  Medication Dose Route Frequency Provider Last Rate Last Admin   acetaminophen (TYLENOL) tablet 650 mg  650 mg Oral Q6H PRN Alma Friendly, MD       Or   acetaminophen (TYLENOL) suppository 650 mg  650 mg Rectal Q6H PRN Alma Friendly, MD       antiseptic oral rinse (BIOTENE) solution  15 mL  15 mL Topical PRN Alma Friendly, MD       Chlorhexidine Gluconate Cloth 2 % PADS 6 each  6 each Topical Daily Rai, Ripudeep K, MD   6 each at 03/22/19 0951   chlorproMAZINE (THORAZINE) 12.5 mg in sodium chloride 0.9 % 25 mL IVPB  12.5 mg Intravenous Q6H PRN Alma Friendly, MD       diphenhydrAMINE (BENADRYL) injection 12.5 mg  12.5 mg Intravenous Q4H PRN Alma Friendly, MD       glycopyrrolate (ROBINUL) tablet 1 mg  1 mg Oral Q4H PRN Alma Friendly, MD       Or   glycopyrrolate (ROBINUL) injection 0.2 mg  0.2 mg Subcutaneous Q4H PRN Alma Friendly, MD       Or   glycopyrrolate (ROBINUL) injection 0.2 mg  0.2 mg Intravenous Q4H PRN Alma Friendly, MD       haloperidol (HALDOL) tablet 0.5 mg  0.5 mg Oral Q4H PRN Alma Friendly, MD       Or   haloperidol (HALDOL) 2 MG/ML solution 0.5 mg  0.5 mg Sublingual Q4H PRN Alma Friendly, MD       Or   haloperidol lactate (HALDOL) injection 0.5 mg  0.5 mg Intravenous Q4H PRN Alma Friendly, MD       LORazepam (ATIVAN) tablet 1 mg  1 mg Oral Q4H PRN Alma Friendly, MD       Or   LORazepam (ATIVAN) 2 MG/ML concentrated solution 1 mg  1 mg Sublingual Q4H PRN Alma Friendly, MD       Or   LORazepam (ATIVAN) injection 1 mg  1 mg Intravenous Q4H PRN Alma Friendly, MD       MEDLINE mouth rinse  15 mL Mouth Rinse BID Alma Friendly, MD   15 mL at 03/22/19 0951   morphine 100mg  in NS 146mL (1mg /mL) infusion - premix  1 mg/hr Intravenous Continuous Alma Friendly, MD 1 mL/hr at 03/22/19 1105 1 mg/hr at 03/22/19 1105   morphine bolus via infusion 1 mg  1 mg Intravenous Q15 min PRN Alma Friendly, MD   1 mg at 03/22/19 1201   ondansetron (ZOFRAN-ODT) disintegrating tablet 4 mg  4 mg Oral Q6H PRN Alma Friendly, MD       Or   ondansetron [facility]) injection 4 mg  4 mg Intravenous Q6H PRN Alma Friendly, MD       polyvinyl alcohol (LIQUIFILM  TEARS) 1.4 % ophthalmic solution 1 drop  1 drop Both Eyes QID PRN Alma Friendly, MD        VITAL SIGNS: BP (!) 149/69 (BP Location: Left Arm)    Pulse 85    Temp (!) 95.9 F (35.5 C) (Axillary)    Resp (!) 29    Ht 5\' 9"  (1.753 m)    Wt 74.1 kg    SpO2 94%    BMI 24.12 kg/m  Filed Weights   03/16/19 0222 03/16/19 1653  Weight: 75.8 kg 74.1 kg    Estimated body mass index is 24.12 kg/m as calculated from the following:   Height as of this encounter: 5\' 9"  (1.753 m).   Weight as of this encounter: 74.1 kg.  LABS: CBC:    Component Value Date/Time   WBC 18.7 (H) 03/22/2019 0241   HGB 11.6 (L) 03/22/2019 0241   HCT 35.6 (L) 03/22/2019 0241   PLT 243 03/22/2019 0241   MCV 95.4 03/22/2019 0241   NEUTROABS 16.1 (H) 03/22/2019 0241   LYMPHSABS 0.7 03/22/2019 0241   MONOABS 1.6 (H) 03/22/2019 0241   EOSABS 0.0 03/22/2019 0241   BASOSABS 0.0 03/22/2019 0241   Comprehensive Metabolic Panel:    Component Value Date/Time   NA 146 (H) 03/22/2019 0241   NA 141 01/23/2016 0000   K 3.8 03/22/2019 0241   CL 112 (H) 03/22/2019 0241   CO2 22 03/22/2019 0241   BUN 33 (H) 03/22/2019 0241   BUN 29 (A) 01/23/2016 0000   CREATININE 1.78 (H) 03/22/2019 0241   CREATININE 1.41 (H) 09/24/2018 0655   GLUCOSE 140 (H) 03/22/2019 0241   CALCIUM 8.1 (L) 03/22/2019 0241   AST 21 02/26/2019 2221   ALT 17 03/24/2019 2221   ALKPHOS 78 03/22/2019 2221   BILITOT 1.0 03/03/2019 2221   PROT 6.4 (L) 03/06/2019 2221   ALBUMIN 2.8 (L) 03/05/2019 2221    RADIOGRAPHIC STUDIES: CT ANGIO CHEST PE W OR WO CONTRAST  Result Date: 03/18/2019 CLINICAL DATA:  Shortness of breath. EXAM: CT ANGIOGRAPHY CHEST WITH CONTRAST TECHNIQUE: Multidetector CT imaging of the chest was performed using the standard protocol during bolus administration of intravenous contrast. Multiplanar CT image reconstructions and MIPs were obtained to evaluate the vascular anatomy. CONTRAST:  180mL OMNIPAQUE IOHEXOL 350 MG/ML SOLN  COMPARISON:  None. FINDINGS: Cardiovascular: Satisfactory opacification of the pulmonary arteries to the segmental level. No evidence of pulmonary embolism. Mild cardiomegaly is noted. Atherosclerosis of thoracic aorta is noted without aneurysm or dissection. No pericardial effusion. Mediastinum/Nodes: Large sliding-type hiatal hernia is noted. No adenopathy is noted. Thyroid gland is unremarkable. Lungs/Pleura: Large right pleural effusion is noted with complete atelectasis of the right lower lobe and significant atelectasis involving the right upper lobe. Moderate left pleural effusion is noted with adjacent subsegmental atelectasis. No pneumothorax is noted. Left upper lobe airspace opacity is noted concerning for pneumonia. Upper Abdomen: No acute abnormality. Musculoskeletal: No chest wall abnormality. No acute or significant osseous findings. Review of the MIP images confirms the above findings. IMPRESSION: 1. No definite evidence of pulmonary embolus. 2. Large right pleural effusion is noted with complete atelectasis of the right lower lobe and significant atelectasis involving the right upper lobe. 3. Moderate left pleural effusion is noted with adjacent subsegmental atelectasis. 4. Left upper lobe airspace opacity is noted concerning for pneumonia. 5. Large sliding-type hiatal hernia is noted. Aortic Atherosclerosis (ICD10-I70.0). Electronically Signed   By: Marijo Conception M.D.   On: 03/09/2019 15:48   DG Chest Port 1 View  Result Date: 03/21/2019 CLINICAL DATA:  Cough and drop in oxygen saturation after drinking water. EXAM: PORTABLE CHEST 1 VIEW COMPARISON:  Single-view of the chest 03/20/2019. FINDINGS: Bilateral airspace disease is worse on the left. Aeration in the right mid lung zone is worse than on yesterday's study. There are likely bilateral pleural effusions. No pneumothorax. Cardiac silhouette is largely obscured. Atherosclerosis noted. IMPRESSION: Extensive bilateral airspace disease has  worsened in the right mid lung zone since yesterday's exam. Right greater  than left effusions are unchanged. Electronically Signed   By: Inge Rise M.D.   On: 03/21/2019 16:54   DG Chest Port 1 View  Result Date: 03/20/2019 CLINICAL DATA:  Post RIGHT side thoracentesis EXAM: PORTABLE CHEST 1 VIEW COMPARISON:  Portable exam 1149 hours compared to 03/17/2019 FINDINGS: RIGHT subclavian pacemaker leads project over RIGHT atrium and RIGHT ventricle unchanged. Enlargement of cardiac silhouette. Atherosclerotic calcification aorta. BILATERAL pulmonary infiltrates diffusely, significantly increased on LEFT since previous exam. Decreased RIGHT pleural effusion and basilar atelectasis post thoracentesis. No pneumothorax. Small LEFT pleural effusion. Bones demineralized. IMPRESSION: No pneumothorax following RIGHT thoracentesis. Diffuse BILATERAL pulmonary infiltrates question edema versus multifocal pneumonia, significantly increased on LEFT since prior study. Electronically Signed   By: Lavonia Dana M.D.   On: 03/20/2019 12:13   DG Chest Port 1 View  Result Date: 03/17/2019 CLINICAL DATA:  History of COVID-19 positivity with cough and fever EXAM: PORTABLE CHEST 1 VIEW COMPARISON:  03/22/2019 FINDINGS: Increasing right basilar infiltrate is noted with associated effusion. Cardiomegaly is again noted. Pacing device is stable. Left lung is clear. No bony abnormality is noted. IMPRESSION: Increasing right basilar infiltrate and effusion. Electronically Signed   By: Inez Catalina M.D.   On: 03/17/2019 14:26   DG Chest Port 1 View  Result Date: 03/13/2019 CLINICAL DATA:  Fever and shortness of breath. EXAM: PORTABLE CHEST 1 VIEW COMPARISON:  In April 21, 2016 FINDINGS: There is a dual lead AICD. Mild infiltrate is seen along the medial aspect of the right lung base. There is elevation of the right hemidiaphragm. No pleural effusion or pneumothorax is identified. The cardiac silhouette is moderately enlarged.  Marked severity calcification of the aortic arch is seen. Multilevel degenerative changes are noted throughout the thoracic spine. IMPRESSION: 1. Mild right basilar infiltrate. 2. Cardiomegaly. Electronically Signed   By: Virgina Norfolk M.D.   On: 03/14/2019 22:46   DG Swallowing Func-Speech Pathology  Result Date: 03/18/2019 Objective Swallowing Evaluation: Type of Study: MBS-Modified Barium Swallow Study  Patient Details Name: Stanley Hunter MRN: [id number] Date of Birth: [birth year] Today's Date: 03/15/2019 Time: SLP Start Time (ACUTE ONLY): 0836 -SLP Stop Time (ACUTE ONLY): [id number] SLP Time Calculation (min) (ACUTE ONLY): 19 min Past Medical History: Past Medical History: Diagnosis Date  Acid reflux 08/03/2015  Anxiety 08/03/2015  Avitaminosis D 05/18/2012  Benign essential HTN 02/09/2009  Overview:  Benign Essential Hypertension   Benign prostatic hyperplasia with urinary obstruction 12/07/2013  Chronic kidney disease (CKD), stage III (moderate) 11/04/2011  CN (constipation) 08/03/2015  Cognitive changes 08/07/2015  Degenerative arthritis of hip 05/14/2013  Degenerative arthritis of spine 05/14/2013  Degenerative disorder of muscle 08/03/2015  Dysrhythmia 2001  ablation for a-flutter  H/O atrial flutter 08/03/2015  Overview:  Pacemaker placed 2015   High blood pressure   High cholesterol   History of pacemaker 2016  Hx of cardiac cath 2013  Loss of balance 08/07/2015  Lumbar canal stenosis 07/07/2013  Overview:  Lumbar laminectomy 07/07/13 - Dr. Lurene Shadow   Lumbar scoliosis 05/14/2013  Macular degeneration 08/07/2015  Premature contractions, supraventricular 12/15/2008  Overview:  Atrial Premature Complex   Presence of permanent cardiac pacemaker   Tremor 08/07/2015  hands  Urinary frequency 08/07/2015 Past Surgical History: Past Surgical History: Procedure Laterality Date  ATRIAL FLUTTER ABLATION  2001  CATARACT EXTRACTION Bilateral 2015  CHOLECYSTECTOMY    COLONOSCOPY    MASTOIDECTOMY  1934  NASAL  SEPTOPLASTY W/ TURBINOPLASTY Bilateral 10/14/2018  Procedure: NASAL SEPTOPLASTY WITH BILATERAL  TURBINATE REDUCTION;  Surgeon:  Leta Baptist, MD;  Location: [facility] OR;  Service: ENT;  Laterality: Bilateral;  PACEMAKER PLACEMENT  2016  SPINE SURGERY  2016  LUMBER- lower  TONSILLECTOMY  1934  TRIGGER FINGER RELEASE Left 12/14/2015  Procedure: RELEASE TRIGGER FINGER/A-1 PULLEY ring finger left;  Surgeon: Daryll Brod, MD;  Location: [place];  Service: Orthopedics;  Laterality: Left;  FAB HPI: 86 yo male adm to [facility] 2 days previously with difficulty breathing and AMS. Pt found to have pna - CXR today showing progression which is concerning.  Swallow eval ordered.  Pt with PMH + for history significant of coronary artery disease status post. Cardiac catheterization, chronic kidney disease stage III, essential hypertension, degenerative disc joint disease, sinusitis s/p septum surgery in 2020, "Memory difficulty" per Dr Bubba Camp in snapshot, history of pacemaker placement 2015, GERD, who had his Covid vaccine 2 weeks prior to admission, Chest x-ray showed lobar pneumonia.  Concern for aspiration present and swallow eval ordered.  Subjective: pt awake in chair, reports his breathing is better Assessment / Plan / Recommendation CHL IP CLINICAL IMPRESSIONS 03/02/2019 Clinical Impression Patient presents with mild pharyngoesophageal dysphagia with decreased CP/UES relaxation/opening resulting in minimal backflow of barium to distal pharynx.  No aspiration or penetration observed and swallow was timely and strong.  Pt does appear with anterior protrusion of cervical spine at C3-C4 into pharynx which at times diminished epiglottic deflection.   Pt consumed thin via straw sequentially with adequate protection of airway.  Adequate laryngeal elevation/closure observed.  Of note, pt reported sensation of residuals in pharynx x1 after cracker bolus swallow.   Upon esophageal sweep after liquid consumption, he appeared with  some retrograde propulsion of thin - ? due to dysmotility.  Recommend continue diet as tolerated being mindful of pt's work of breathing due to risk of impairing reciprocity of swallow/breathing. SLP to follow briefly for esophageal/respiratory precautions.  Thanks for this consult. SLP Visit Diagnosis Dysphagia, pharyngoesophageal phase (R13.14) Attention and concentration deficit following -- Frontal lobe and executive function deficit following -- Impact on safety and function Mild aspiration risk   CHL IP TREATMENT RECOMMENDATION 03/17/2019 Treatment Recommendations Therapy as outlined in treatment plan below   Prognosis 03/02/2019 Prognosis for Safe Diet Advancement Fair Barriers to Reach Goals Other (Comment) Barriers/Prognosis Comment -- CHL IP DIET RECOMMENDATION 03/06/2019 SLP Diet Recommendations (No Data) Liquid Administration via -- Medication Administration (No Data) Compensations (No Data) Postural Changes Remain semi-upright after after feeds/meals (Comment);Seated upright at 90 degrees   CHL IP OTHER RECOMMENDATIONS 03/21/2019 Recommended Consults -- Oral Care Recommendations Oral care before and after PO Other Recommendations Have oral suction available   CHL IP FOLLOW UP RECOMMENDATIONS 03/09/2019 Follow up Recommendations None   CHL IP FREQUENCY AND DURATION 03/25/2019 Speech Therapy Frequency (ACUTE ONLY) min 2x/week Treatment Duration 2 weeks      CHL IP ORAL PHASE 03/22/2019 Oral Phase Impaired Oral - Pudding Teaspoon -- Oral - Pudding Cup -- Oral - Honey Teaspoon -- Oral - Honey Cup -- Oral - Nectar Teaspoon NT Oral - Nectar Cup -- Oral - Nectar Straw WFL Oral - Thin Teaspoon WFL Oral - Thin Cup -- Oral - Thin Straw WFL;Piecemeal swallowing;Other (Comment) Oral - Puree WFL Oral - Mech Soft -- Oral - Regular WFL Oral - Multi-Consistency -- Oral - Pill -- Oral Phase - Comment --  CHL IP PHARYNGEAL PHASE 03/06/2019 Pharyngeal Phase Impaired Pharyngeal- Pudding Teaspoon -- Pharyngeal -- Pharyngeal-  Pudding Cup -- Pharyngeal -- Pharyngeal- Honey Teaspoon -- Pharyngeal --  Pharyngeal- Honey Cup -- Pharyngeal -- Pharyngeal- Nectar Teaspoon -- Pharyngeal -- Pharyngeal- Nectar Cup -- Pharyngeal -- Pharyngeal- Nectar Straw WFL Pharyngeal Material does not enter airway Pharyngeal- Thin Teaspoon WFL Pharyngeal Material does not enter airway Pharyngeal- Thin Cup -- Pharyngeal -- Pharyngeal- Thin Straw WFL Pharyngeal Material does not enter airway Pharyngeal- Puree WFL Pharyngeal Material does not enter airway Pharyngeal- Mechanical Soft -- Pharyngeal -- Pharyngeal- Regular WFL Pharyngeal Material does not enter airway Pharyngeal- Multi-consistency -- Pharyngeal -- Pharyngeal- Pill -- Pharyngeal -- Pharyngeal Comment --  CHL IP CERVICAL ESOPHAGEAL PHASE 03/26/2019 Cervical Esophageal Phase Impaired Pudding Teaspoon -- Pudding Cup -- Honey Teaspoon -- Honey Cup -- Nectar Teaspoon -- Nectar Cup -- Nectar Straw Reduced cricopharyngeal relaxation;Esophageal backflow into the pharynx;Prominent cricopharyngeal segment Thin Teaspoon Reduced cricopharyngeal relaxation;Esophageal backflow into the pharynx;Prominent cricopharyngeal segment Thin Cup -- Thin Straw Reduced cricopharyngeal relaxation;Esophageal backflow into the pharynx;Prominent cricopharyngeal segment Puree Reduced cricopharyngeal relaxation;Esophageal backflow into the pharynx;Prominent cricopharyngeal segment Mechanical Soft -- Regular Reduced cricopharyngeal relaxation;Esophageal backflow into the pharynx;Prominent cricopharyngeal segment Multi-consistency -- Pill -- Cervical Esophageal Comment -- Kathleen Lime, MS [facility] SLP Acute Rehab Services Office [phone] Macario Golds 03/09/2019, 9:54 AM              ECHOCARDIOGRAM COMPLETE  Result Date: 03/17/2019   ECHOCARDIOGRAM REPORT   Patient Name:   Stanley Hunter Date of Exam: 03/17/2019 Medical Rec #:  [id number]    Height:       69.0 in Accession #:    [id number]   Weight:       163.4 lb Date of Birth:  31-Oct-1924      BSA:          1.90 m Patient Age:    35 years     BP:           141/74 mmHg Patient Gender: M            HR:           125 bpm. Exam Location:  Inpatient Procedure: 2D Echo Indications:    427.31 atrial fibrillation  History:        Patient has no prior history of Echocardiogram examinations.                 Pacemaker, Arrythmias:Atrial Flutter; Risk Factors:Hypertension,                 Dyslipidemia and Former Smoker.  Sonographer:    Jannett Celestine RDCS (AE) Referring Phys: [id number] RIPUDEEP K RAI  Sonographer Comments: restricted mobility IMPRESSIONS  1. Left ventricular ejection fraction, by visual estimation, is 65 to 70%. The left ventricle has hyperdynamic function. There is mildly increased left ventricular hypertrophy.  2. The left ventricle has no regional wall motion abnormalities.  3. Global right ventricle has normal systolic function.The right ventricular size is normal. No increase in right ventricular wall thickness.  4. Left atrial size was normal.  5. Right atrial size was normal.  6. Mild to moderate mitral annular calcification.  7. The mitral valve is normal in structure. No evidence of mitral valve regurgitation. No evidence of mitral stenosis.  8. The tricuspid valve is normal in structure.  9. The tricuspid valve is normal in structure. Tricuspid valve regurgitation is not demonstrated. 10. The aortic valve is normal in structure. Aortic valve regurgitation is not visualized. Mild to moderate aortic valve sclerosis/calcification without any evidence of aortic stenosis. 11. The pulmonic valve was normal in structure. Pulmonic valve regurgitation is  not visualized. 12. The inferior vena cava is normal in size with greater than 50% respiratory variability, suggesting right atrial pressure of 3 mmHg. FINDINGS  Left Ventricle: Left ventricular ejection fraction, by visual estimation, is 65 to 70%. The left ventricle has hyperdynamic function. The left ventricle has no regional wall motion  abnormalities. There is mildly increased left ventricular hypertrophy. Normal left atrial pressure. Right Ventricle: The right ventricular size is normal. No increase in right ventricular wall thickness. Global RV systolic function is has normal systolic function. Left Atrium: Left atrial size was normal in size. Right Atrium: Right atrial size was normal in size Pericardium: There is no evidence of pericardial effusion. Mitral Valve: The mitral valve is normal in structure. Mild to moderate mitral annular calcification. No evidence of mitral valve regurgitation. No evidence of mitral valve stenosis by observation. Tricuspid Valve: The tricuspid valve is normal in structure. Tricuspid valve regurgitation is not demonstrated. Aortic Valve: The aortic valve is normal in structure. Aortic valve regurgitation is not visualized. Mild to moderate aortic valve sclerosis/calcification is present, without any evidence of aortic stenosis. Pulmonic Valve: The pulmonic valve was normal in structure. Pulmonic valve regurgitation is not visualized. Pulmonic regurgitation is not visualized. Aorta: The aortic root, ascending aorta and aortic arch are all structurally normal, with no evidence of dilitation or obstruction. Venous: The inferior vena cava is normal in size with greater than 50% respiratory variability, suggesting right atrial pressure of 3 mmHg. IAS/Shunts: No atrial level shunt detected by color flow Doppler. There is no evidence of a patent foramen ovale. No ventricular septal defect is seen or detected. There is no evidence of an atrial septal defect.  LEFT VENTRICLE PLAX 2D LVIDd:         3.70 cm LVIDs:         2.30 cm LV PW:         1.40 cm LV IVS:        1.00 cm LVOT diam:     2.30 cm LV SV:         40 ml LV SV Index:   20.99 LVOT Area:     4.15 cm  LEFT ATRIUM             Index LA diam:        3.30 cm 1.74 cm/m LA Vol (A2C):   38.9 ml 20.52 ml/m LA Vol (A4C):   44.8 ml 23.63 ml/m LA Biplane Vol: 44.3 ml  23.37 ml/m  AORTIC VALVE LVOT Vmax:   85.80 cm/s LVOT Vmean:  68.000 cm/s LVOT VTI:    0.154 m  AORTA Ao Root diam: 3.30 cm  SHUNTS Systemic VTI:  0.15 m Systemic Diam: 2.30 cm  Candee Furbish MD Electronically signed by Candee Furbish MD Signature Date/Time: 03/17/2019/3:29:30 PM    Final    VAS [place] LOWER EXTREMITY VENOUS (DVT)  Result Date: 03/16/2019  Lower Venous Study Indications: Edema.  Risk Factors: None identified. Limitations: Patient positioning, patient movement. Comparison Study: No prior studies. Performing Technologist: Oliver Hum RVT  Examination Guidelines: A complete evaluation includes B-mode imaging, spectral Doppler, color Doppler, and power Doppler as needed of all accessible portions of each vessel. Bilateral testing is considered an integral part of a complete examination. Limited examinations for reoccurring indications may be performed as noted.  +---------+---------------+---------+-----------+----------+--------------+  RIGHT     Compressibility Phasicity Spontaneity Properties Thrombus Aging  +---------+---------------+---------+-----------+----------+--------------+  CFV       Full  Yes       Yes                                    +---------+---------------+---------+-----------+----------+--------------+  SFJ       Full                                                             +---------+---------------+---------+-----------+----------+--------------+  FV Prox   Full                                                             +---------+---------------+---------+-----------+----------+--------------+  FV Mid    Full                                                             +---------+---------------+---------+-----------+----------+--------------+  FV Distal Full                                                             +---------+---------------+---------+-----------+----------+--------------+  PFV       Full                                                              +---------+---------------+---------+-----------+----------+--------------+  POP       Full            Yes       Yes                                    +---------+---------------+---------+-----------+----------+--------------+  PTV       Full                                                             +---------+---------------+---------+-----------+----------+--------------+  PERO      Full                                                             +---------+---------------+---------+-----------+----------+--------------+   +---------+---------------+---------+-----------+----------+--------------+  LEFT      Compressibility Phasicity  Spontaneity Properties Thrombus Aging  +---------+---------------+---------+-----------+----------+--------------+  CFV       Full            Yes       Yes                                    +---------+---------------+---------+-----------+----------+--------------+  SFJ       Full                                                             +---------+---------------+---------+-----------+----------+--------------+  FV Prox   Full                                                             +---------+---------------+---------+-----------+----------+--------------+  FV Mid    Full                                                             +---------+---------------+---------+-----------+----------+--------------+  FV Distal Full                                                             +---------+---------------+---------+-----------+----------+--------------+  PFV       Full                                                             +---------+---------------+---------+-----------+----------+--------------+  POP       Full            Yes       Yes                                    +---------+---------------+---------+-----------+----------+--------------+  PTV       Full                                                              +---------+---------------+---------+-----------+----------+--------------+  PERO      Full                                                             +---------+---------------+---------+-----------+----------+--------------+  Summary: Right: There is no evidence of deep vein thrombosis in the lower extremity. No cystic structure found in the popliteal fossa. Left: There is no evidence of deep vein thrombosis in the lower extremity. No cystic structure found in the popliteal fossa.  *See table(s) above for measurements and observations. Electronically signed by Deitra Mayo MD on 03/16/2019 at 12:51:29 PM.    Final    US THORACENTESIS ASP PLEURAL SPACE W/IMG GUIDE  Result Date: 03/20/2019 INDICATION: Shortness of breath, pneumonia. Right-sided pleural effusion. Request diagnostic and therapeutic thoracentesis. EXAM: ULTRASOUND GUIDED RIGHT THORACENTESIS MEDICATIONS: None. COMPLICATIONS: None immediate. PROCEDURE: An ultrasound guided thoracentesis was thoroughly discussed with the patient and questions answered. The benefits, risks, alternatives and complications were also discussed. The patient understands and wishes to proceed with the procedure. Written consent was obtained. Ultrasound was performed to localize and mark an adequate pocket of fluid in the right chest. The area was then prepped and draped in the normal sterile fashion. 1% Lidocaine was used for local anesthesia. Under ultrasound guidance a 6 Fr Safe-T-Centesis catheter was introduced. Thoracentesis was performed. The catheter was removed and a dressing applied. FINDINGS: A total of approximately 1.1 L of hazy, amber fluid was removed. Samples were sent to the laboratory as requested by the clinical team. IMPRESSION: Successful ultrasound guided right thoracentesis yielding 1.1 L of pleural fluid. Read by: Ascencion Dike PA-C Electronically Signed   By: Markus Daft M.D.   On: 03/20/2019 12:10    PERFORMANCE STATUS (ECOG) : 3 -  Symptomatic, >50% confined to bed  Review of Systems Unless otherwise noted, a complete review of systems is negative.  Physical Exam General: Frail appearing Cardiovascular: regular rate and rhythm Pulmonary: Coarse, wheezing anterior fields, using accessory muscles. Abdomen: soft, nontender, + bowel sounds GU: no suprapubic tenderness Extremities: no edema, no joint deformities Skin: no rashes Neurological: Weakness but otherwise nonfocal  IMPRESSION: Dyspnea Generalized discomfort   PLAN: -Agree with current comfort measures Uptitrate opioids prn Anticipate hospital death Wife and daughter at bedside, PMT has discussed with them over the weekend. Appreciate bedside nursing Comfort cart for family and chaplain consult is recommended.    Time Total: 25 minutes  Visit consisted of counseling and education dealing with the complex and emotionally intense issues of symptom management and palliative care in the setting of serious and potentially life-threatening illness.Greater than 50%  of this time was spent counseling and coordinating care related to the above assessment and plan.  Signed by: Loistine Chance MD [facility] team [phone]

## 2019-03-22 NOTE — Progress Notes (Signed)
[facility] - Marketing executive received from Velva Harman for family interest in [place]. Chart reviewed. Attempted contact with spouse. Left voice message requesting call back.   Will follow up in am re eligibility and availability.   Thank you,  Erling Conte, LCSW [phone]  Hilma Favors are listed daily on [facility] under Hospice and [place].

## 2019-03-22 NOTE — TOC Progression Note (Addendum)
Transition of Care [facility]) - Progression Note    Patient Details  Name: Stanley Hunter MRN: [id number] Date of Birth: 1924/07/25  [facility] CM/SW Contact  Leeroy Cha, RN Phone Number: 03/22/2019, 2:17 PM  Clinical Narrative:    Ival Bible with hospice [facility]-informed that family is wanting patient to go [facility].  wcb. [facility]-Sherry with [employer] will start referral for [facility]       Expected Discharge Plan and Services                                                 Social Determinants of Health (SDOH) Interventions    Readmission Risk Interventions No flowsheet data found.

## 2019-03-22 NOTE — Progress Notes (Signed)
PROGRESS NOTE  Stanley Hunter [id number] DOB: Sep 04, 1924 DOA: 03/14/2019 PCP: Mast, Man X, NP  HPI/Recap of past 24 hours: HPI from Dr Ladona Ridgel is a 86 y.o. male with medical history significant of coronary artery disease status post. Cardiac catheterization, chronic kidney disease stage III, essential hypertension, degenerative disc joint disease, history of pacemaker placement 2015, who had his Covid vaccine 2 weeks ago and presents with shortness of breath, cough and fever. Patient also has lower extremity edema. He was found to be hypoxic. Chest x-ray showed lobar pneumonia. In the ED, patient noted to be hypoxic, Covid test negative.  Patient admitted for further management    Today, patient noted to be more dyspneic, requiring about 70 L of heated high flow, restless, moaning in discomfort, unresponsive.   Assessment/Plan: Principal Problem:   CAP (community acquired pneumonia) Active Problems:   Pure hypercholesterolemia   History of pacemaker   Hypertensive heart and renal disease   Anemia of chronic disease   Hyperlipidemia   Stage 3b chronic kidney disease   Persistent atrial fibrillation (HCC)   Palliative care encounter   Acute hypoxic respiratory failure likely 2/2 CAP complicated by large R pleural effusion (parapneumonic effusion) Currently still requiring more O2, Afebrile, with worsening leukocytosis Urine strep pneumo negative, Legionella negative COVID-19, influenza panel negative ABG with hypoxia Initial chest x-ray with right basilar infiltrate CTA chest showed no PE, large right pleural effusion with complete atelectasis involving the right upper lobe, moderate left pleural effusion with left upper lobe airspace opacity Status post US guided right thoracentesis on 03/20/2019, which yielded 1.1 L of hazy amber fluid, likely exudative, repeat chest x-ray with no pneumothorax, diffuse bilateral pulmonary infiltrates possibly edema versus multifocal  pneumonia S/P ceftriaxone/IV Unasyn, cefepime, azithromycin, vancomycin (MRSA PCR +) S/P IV lasix 40 mg Currently switched to comfort care, supplemental O2 as needed  A. fib with RVR Had previous flutter ablation, pacemaker implantation secondary to SSS Venous Dopplers lower extremity negative for DVT Cardiology on board, hold off on cardioversion S/p Cardizem drip, p.o Cardizem, propafenone, Eliquis Switched to comfort care  ??UTI Urine culture grew Staphylococcus warneri, 2000 colonies S/P vancomycin as above  ?CHF Elevated BNP, 434.8 Echo with EF of 65 to 70%, no regional wall motion abnormality S/P IV Lasix  AKI on CKD stage IIIb Baseline creatinine 1.9  Hypertension/Hyperlipidemia Comfort care  BPH Insert Foley for end-of-life care  GOC Very poor prognosis, palliative consulted Discussed with wife and daughter extensively on 03/20/2019 and 03/21/2019, agreed for comfort care measures and possibly residential hospice TOC notified        Malnutrition Type:      Malnutrition Characteristics:      Nutrition Interventions:       Estimated body mass index is 24.12 kg/m as calculated from the following:   Height as of this encounter: 5\' 9"  (1.753 m).   Weight as of this encounter: 74.1 kg.     Code Status: DNR  Family Communication: Discussed with wife and daughter extensively on 03/20/2019 and 03/21/2019, agreed for comfort care measures and possibly residential hospice   Disposition Plan: Anticipate hospital death Vs residential hospice   Consultants:  Cardiology  Palliative  Procedures:  Thoracentesis on 03/20/2019  Antimicrobials:  Stopped all antibiotics  DVT prophylaxis: Comfort care   Objective: Vitals:   03/22/19 0800 03/22/19 1117 03/22/19 1239 03/22/19 1321  BP:      Pulse:   (!) 58 (!) 130  Resp:   (!) 27 (!)  29  Temp: (!) 95.9 F (35.5 C)     TempSrc: Axillary     SpO2:  94% 93% 95%  Weight:      Height:         Intake/Output Summary (Last 24 hours) at 03/22/2019 1501 Last data filed at 03/22/2019 0600 Gross per 24 hour  Intake 274.15 ml  Output 850 ml  Net -575.85 ml   Filed Weights   03/16/19 0222 03/16/19 1653  Weight: 75.8 kg 74.1 kg    Exam:  General:  Moderate distress, moaning, lethargic, somnolent  Cardiovascular: S1, S2 present  Respiratory:  Coarse breath sounds bilaterally  Abdomen: Soft, nontender, nondistended, bowel sounds present  Musculoskeletal: No bilateral pedal edema noted  Skin: Normal  Psychiatry: Unable to assess    Data Reviewed: CBC: Recent Labs  Lab 03/22/2019 2221 03/16/19 1115 03/17/19 0129 03/16/2019 0147 03/20/19 0211 03/21/19 0229 03/22/19 0241  WBC 11.9*   < > 10.5 11.2* 12.0* 14.1* 18.7*  NEUTROABS 8.1*  --   --  8.8* 9.6* 11.8* 16.1*  HGB 11.4*   < > 11.3* 10.8* 10.5* 11.4* 11.6*  HCT 35.1*   < > 35.6* 34.0* 32.8* 35.4* 35.6*  MCV 96.2   < > 96.2 96.6 95.9 95.4 95.4  PLT 181   < > 199 202 225 245 243   < > = values in this interval not displayed.   Basic Metabolic Panel: Recent Labs  Lab 03/17/19 0129 03/17/19 0129 03/18/19 0127 03/11/2019 0147 03/20/19 0211 03/21/19 0229 03/22/19 0241  NA 139   < > 140 142 142 144 146*  K 3.9   < > 3.9 3.8 3.7 3.5 3.8  CL 109   < > 109 111 111 111 112*  CO2 19*   < > 20* 20* 20* 22 22  GLUCOSE 113*   < > 134* 141* 129* 130* 140*  BUN 26*   < > 28* 26* 30* 27* 33*  CREATININE 1.33*   < > 1.32* 1.26* 1.55* 1.50* 1.78*  CALCIUM 8.0*   < > 8.3* 8.1* 8.1* 8.1* 8.1*  MG 2.0  --   --   --   --   --   --    < > = values in this interval not displayed.   GFR: Estimated Creatinine Clearance: 26.5 mL/min (A) (by C-G formula based on SCr of 1.78 mg/dL (H)). Liver Function Tests: Recent Labs  Lab 03/24/2019 2221  AST 21  ALT 17  ALKPHOS 78  BILITOT 1.0  PROT 6.4*  ALBUMIN 2.8*   No results for input(s): LIPASE, AMYLASE in the last 168 hours. No results for input(s): AMMONIA in the last 168  hours. Coagulation Profile: Recent Labs  Lab 03/02/2019 2221  INR 1.4*   Cardiac Enzymes: No results for input(s): CKTOTAL, CKMB, CKMBINDEX, TROPONINI in the last 168 hours. BNP (last 3 results) No results for input(s): PROBNP in the last 8760 hours. HbA1C: No results for input(s): HGBA1C in the last 72 hours. CBG: No results for input(s): GLUCAP in the last 168 hours. Lipid Profile: No results for input(s): CHOL, HDL, LDLCALC, TRIG, CHOLHDL, LDLDIRECT in the last 72 hours. Thyroid Function Tests: No results for input(s): TSH, T4TOTAL, FREET4, T3FREE, THYROIDAB in the last 72 hours. Anemia Panel: No results for input(s): VITAMINB12, FOLATE, FERRITIN, TIBC, IRON, RETICCTPCT in the last 72 hours. Urine analysis:    Component Value Date/Time   COLORURINE YELLOW 03/16/2019 0222   APPEARANCEUR HAZY (A) 03/16/2019 0222   LABSPEC  1.024 03/16/2019 0222   PHURINE 5.0 03/16/2019 0222   GLUCOSEU NEGATIVE 03/16/2019 0222   HGBUR NEGATIVE 03/16/2019 0222   BILIRUBINUR NEGATIVE 03/16/2019 0222   KETONESUR 5 (A) 03/16/2019 0222   PROTEINUR 30 (A) 03/16/2019 0222   NITRITE NEGATIVE 03/16/2019 0222   LEUKOCYTESUR NEGATIVE 03/16/2019 0222   Sepsis Labs: @LABRCNTIP (procalcitonin:4,lacticidven:4)  ) Recent Results (from the past 240 hour(s))  Blood Culture (routine x 2)     Status: None   Collection Time: 03/14/2019 10:21 PM   Specimen: BLOOD  Result Value Ref Range Status   Specimen Description   Final    BLOOD BLOOD RIGHT ARM Performed at [facility], [place] [address] [place], [place] [address]    Special Requests   Final    BOTTLES DRAWN AEROBIC AND ANAEROBIC Blood Culture adequate volume Performed at [place] [address], [place], [place] [address]    Culture   Final    NO GROWTH 5 DAYS Performed at [facility], [place] [address] [place], [place] [address]    Report Status 03/21/2019 FINAL  Final  Blood Culture (routine x 2)      Status: Abnormal   Collection Time: 03/26/2019 10:26 PM   Specimen: BLOOD  Result Value Ref Range Status   Specimen Description   Final    BLOOD BLOOD LEFT HAND Performed at [place] [address], [place], [place] [address]    Special Requests   Final    BOTTLES DRAWN AEROBIC AND ANAEROBIC Blood Culture adequate volume Performed at [place] [address] [place], [place] [address]    Culture  Setup Time   Final    ANAEROBIC BOTTLE ONLY GRAM POSITIVE COCCI CRITICAL RESULT CALLED TO, READ BACK BY AND VERIFIED WITH: B GREEN PHARMD 03/17/19 0137 JDW    Culture (A)  Final    STAPHYLOCOCCUS SPECIES (COAGULASE NEGATIVE) THE SIGNIFICANCE OF ISOLATING THIS ORGANISM FROM A SINGLE SET OF BLOOD CULTURES WHEN MULTIPLE SETS ARE DRAWN IS UNCERTAIN. PLEASE NOTIFY THE MICROBIOLOGY DEPARTMENT WITHIN ONE WEEK IF SPECIATION AND SENSITIVITIES ARE REQUIRED. Performed at [facility], [place] [address] [place], [place] [address]    Report Status 03/22/2019 FINAL  Final  Respiratory Panel by RT PCR (Flu A&B, Covid) - Nasopharyngeal Swab     Status: None   Collection Time: 03/02/2019 11:01 PM   Specimen: Nasopharyngeal Swab  Result Value Ref Range Status   SARS Coronavirus 2 by RT PCR NEGATIVE NEGATIVE Final    Comment: (NOTE) SARS-CoV-2 target nucleic acids are NOT DETECTED. The SARS-CoV-2 RNA is generally detectable in upper respiratoy specimens during the acute phase of infection. The lowest concentration of SARS-CoV-2 viral copies this assay can detect is 131 copies/mL. A negative result does not preclude SARS-Cov-2 infection and should not be used as the sole basis for treatment or other patient management decisions. A negative result may occur with  improper specimen collection/handling, submission of specimen other than nasopharyngeal swab, presence of viral mutation(s) within the areas targeted by this assay, and inadequate number of viral  copies (<131 copies/mL). A negative result must be combined with clinical observations, patient history, and epidemiological information. The expected result is Negative. Fact Sheet for Patients:  PinkCheek.be Fact Sheet for Healthcare Providers:  GravelBags.it This test is not yet ap proved or cleared by the [place] FDA and  has been authorized for detection and/or diagnosis of SARS-CoV-2 by FDA under an Emergency Use  Authorization (EUA). This EUA will remain  in effect (meaning this test can be used) for the duration of the COVID-19 declaration under Section 564(b)(1) of the Act, 21 U.S.C. section 360bbb-3(b)(1), unless the authorization is terminated or revoked sooner.    Influenza A by PCR NEGATIVE NEGATIVE Final   Influenza B by PCR NEGATIVE NEGATIVE Final    Comment: (NOTE) The Xpert Xpress SARS-CoV-2/FLU/RSV assay is intended as an aid in  the diagnosis of influenza from Nasopharyngeal swab specimens and  should not be used as a sole basis for treatment. Nasal washings and  aspirates are unacceptable for Xpert Xpress SARS-CoV-2/FLU/RSV  testing. Fact Sheet for Patients: PinkCheek.be Fact Sheet for Healthcare Providers: GravelBags.it This test is not yet approved or cleared by the [place] FDA and  has been authorized for detection and/or diagnosis of SARS-CoV-2 by  FDA under an Emergency Use Authorization (EUA). This EUA will remain  in effect (meaning this test can be used) for the duration of the  Covid-19 declaration under Section 564(b)(1) of the Act, 21  U.S.C. section 360bbb-3(b)(1), unless the authorization is  terminated or revoked. Performed at [facility], [place] [address] [place], [place] [address]   Urine culture     Status: Abnormal   Collection Time: 03/16/19  2:22 AM   Specimen: In/Out Cath Urine  Result Value  Ref Range Status   Specimen Description   Final    IN/OUT CATH URINE Performed at [place] [address], [place], [place] [address]    Special Requests   Final    NONE Performed at [facility], [place] [address], [place], [place] [address]    Culture 2,000 COLONIES/mL STAPHYLOCOCCUS WARNERI (A)  Final   Report Status 03/17/2019 FINAL  Final   Organism ID, Bacteria STAPHYLOCOCCUS WARNERI (A)  Final      Susceptibility   Staphylococcus warneri - MIC*    CIPROFLOXACIN <=0.5 SENSITIVE Sensitive     GENTAMICIN <=0.5 SENSITIVE Sensitive     NITROFURANTOIN 32 SENSITIVE Sensitive     OXACILLIN <=0.25 SENSITIVE Sensitive     TETRACYCLINE >=16 RESISTANT Resistant     VANCOMYCIN <=0.5 SENSITIVE Sensitive     TRIMETH/SULFA <=10 SENSITIVE Sensitive     CLINDAMYCIN RESISTANT Resistant     RIFAMPIN <=0.5 SENSITIVE Sensitive     Inducible Clindamycin POSITIVE Resistant     * 2,000 COLONIES/mL STAPHYLOCOCCUS WARNERI  MRSA PCR Screening     Status: Abnormal   Collection Time: 03/16/19  4:53 PM   Specimen: Nasopharyngeal  Result Value Ref Range Status   MRSA by PCR POSITIVE (A) NEGATIVE Final    Comment:        The GeneXpert MRSA Assay (FDA approved for NASAL specimens only), is one component of a comprehensive MRSA colonization surveillance program. It is not intended to diagnose MRSA infection nor to guide or monitor treatment for MRSA infections. RESULT CALLED TO, READ BACK BY AND VERIFIED WITH: GARLAND,G @ 2020 ON [id number] BY POTEAT,S Performed at [facility], [place] [address], [place], [place] [address]   Culture, blood (routine x 2) Call MD if unable to obtain prior to antibiotics being given     Status: None   Collection Time: 03/16/19  5:59 PM   Specimen: BLOOD RIGHT ARM  Result Value Ref Range Status   Specimen Description   Final    BLOOD RIGHT ARM Performed at [place] [address]  [place], [place]  [address]    Special Requests   Final    BOTTLES DRAWN AEROBIC ONLY Blood Culture adequate volume Performed at [place] [facility] [address], [place], [place] [address]    Culture   Final    NO GROWTH 5 DAYS Performed at [facility], [place] [address], [place], [place] [address]    Report Status 03/21/2019 FINAL  Final  Culture, blood (routine x 2) Call MD if unable to obtain prior to antibiotics being given     Status: None   Collection Time: 03/16/19  5:59 PM   Specimen: BLOOD  Result Value Ref Range Status   Specimen Description   Final    BLOOD RIGHT ANTECUBITAL Performed at [place] [address], [place], [place] [address]    Special Requests   Final    BOTTLES DRAWN AEROBIC AND ANAEROBIC Blood Culture adequate volume Performed at [place] [address], [place], [place] [address]    Culture   Final    NO GROWTH 5 DAYS Performed at [facility], [place] [address], [place], [place] [address]    Report Status 03/21/2019 FINAL  Final  SARS CORONAVIRUS 2 (TAT 6-24 HRS) Nasopharyngeal Nasopharyngeal Swab     Status: None   Collection Time: 03/17/19  6:30 PM   Specimen: Nasopharyngeal Swab  Result Value Ref Range Status   SARS Coronavirus 2 NEGATIVE NEGATIVE Final    Comment: (NOTE) SARS-CoV-2 target nucleic acids are NOT DETECTED. The SARS-CoV-2 RNA is generally detectable in upper and lower respiratory specimens during the acute phase of infection. Negative results do not preclude SARS-CoV-2 infection, do not rule out co-infections with other pathogens, and should not be used as the sole basis for treatment or other patient management decisions. Negative results must be combined with clinical observations, patient history, and epidemiological information. The expected result is Negative. Fact Sheet for Patients: SugarRoll.be Fact Sheet for Healthcare  Providers: https://www.woods-mathews.com/ This test is not yet approved or cleared by the [place] FDA and  has been authorized for detection and/or diagnosis of SARS-CoV-2 by FDA under an Emergency Use Authorization (EUA). This EUA will remain  in effect (meaning this test can be used) for the duration of the COVID-19 declaration under Section 56 4(b)(1) of the Act, 21 U.S.C. section 360bbb-3(b)(1), unless the authorization is terminated or revoked sooner. Performed at [facility], [place] [address] [place], [place] [address]   Body fluid culture     Status: None (Preliminary result)   Collection Time: 03/20/19 11:35 AM   Specimen: PATH Cytology Pleural fluid  Result Value Ref Range Status   Specimen Description   Final    PLEURAL Performed at [place] [address], [place], [place] [address]    Special Requests   Final    NONE Performed at [facility], [place] [address] [place], [place] [address]    Gram Stain NO WBC SEEN NO ORGANISMS SEEN   Final   Culture   Final    NO GROWTH 2 DAYS Performed at [facility], [place] [address] [place], [place] [address]    Report Status PENDING  Incomplete  Acid Fast Smear (AFB)     Status: None   Collection Time: 03/20/19 11:35 AM   Specimen: PATH Cytology Pleural fluid  Result Value Ref Range Status   AFB Specimen Processing Concentration  Final   Acid Fast Smear Negative  Final  Comment: (NOTE) Performed At: [facility] [place], [place] [id number] Rush Farmer MD [id number]    Source (AFB) PLEURAL  Final    Comment: Performed at [place] [address] [place], [place] [address]      Studies: DG Chest Port 1 View  Result Date: 03/21/2019 CLINICAL DATA:  Cough and drop in oxygen saturation after drinking water. EXAM: PORTABLE CHEST 1 VIEW COMPARISON:  Single-view of the chest 03/20/2019. FINDINGS:  Bilateral airspace disease is worse on the left. Aeration in the right mid lung zone is worse than on yesterday's study. There are likely bilateral pleural effusions. No pneumothorax. Cardiac silhouette is largely obscured. Atherosclerosis noted. IMPRESSION: Extensive bilateral airspace disease has worsened in the right mid lung zone since yesterday's exam. Right greater than left effusions are unchanged. Electronically Signed   By: Inge Rise M.D.   On: 03/21/2019 16:54    Scheduled Meds: . Chlorhexidine Gluconate Cloth  6 each Topical Daily  . mouth rinse  15 mL Mouth Rinse BID    Continuous Infusions: . chlorproMAZINE (THORAZINE) IV    . morphine 1 mg/hr (03/22/19 1105)     LOS: 6 days     Alma Friendly, MD [facility]  If 7PM-7AM, please contact night-coverage [email or web address] 03/22/2019, 3:01 PM

## 2019-03-22 NOTE — Progress Notes (Signed)
Progress Note  Patient Name: Stanley Hunter Date of Encounter: 03/22/2019  Primary Cardiologist: Marlane Hatcher, MD   Subjective   Somnolent. Does not arouse to speaking or touch. Appears he received ativan and haldol overnight for agitation. He is breathing comfortably with HFNC in place.   Inpatient Medications    Scheduled Meds: . apixaban  5 mg Oral BID  . atorvastatin  20 mg Oral q1800  . azithromycin  500 mg Oral Daily  . Chlorhexidine Gluconate Cloth  6 each Topical Daily  . dextromethorphan-guaiFENesin  1 tablet Oral BID  . diltiazem  360 mg Oral Daily  . furosemide  40 mg Intravenous Daily  . levalbuterol  0.63 mg Nebulization Q6H  . mouth rinse  15 mL Mouth Rinse BID  . pantoprazole  20 mg Oral Daily  . propafenone  225 mg Oral Q6H  . tamsulosin  0.4 mg Oral QHS   Continuous Infusions: . ceFEPime (MAXIPIME) IV Stopped (03/22/19 0606)  . vancomycin Stopped (03/20/19 1950)   PRN Meds: ALPRAZolam, levalbuterol   Vital Signs    Vitals:   03/22/19 0346 03/22/19 0400 03/22/19 0600 03/22/19 0753  BP:  (!) 137/50 (!) 149/69   Pulse:  75 85   Resp:  (!) 30 (!) 29   Temp: (!) 97.2 F (36.2 C)     TempSrc: Axillary     SpO2:  96% (!) 89% 93%  Weight:      Height:        Intake/Output Summary (Last 24 hours) at 03/22/2019 0800 Last data filed at 03/22/2019 0600 Gross per 24 hour  Intake 274.15 ml  Output 1450 ml  Net -1175.85 ml   Filed Weights   03/16/19 0222 03/16/19 1653  Weight: 75.8 kg 74.1 kg    Telemetry    Predominantly sinus rhythm with frequent ectopy and occasional atrial fibrillation; rates fairly well controlled in the 80s-90s - Personally Reviewed  ECG    No new tracings - Personally Reviewed  Physical Exam   GEN: Somnolent; laying in bed in no acute distress.   Neck: No JVD, no carotid bruits Cardiac: RRR, no murmurs, rubs, or gallops.  Respiratory: mild diffuse rhonchi; no wheezing/crackles appreciated GI: NABS, Soft,  nontender, non-distended  MS: No edema; No deformity. Neuro: unable to assess due to somnolence Psych: unable to assess  Labs    Chemistry Recent Labs  Lab 03/14/2019 2221 03/16/19 1211 03/20/19 0211 03/21/19 0229 03/22/19 0241  NA 138   < > 142 144 146*  K 4.3   < > 3.7 3.5 3.8  CL 106   < > 111 111 112*  CO2 21*   < > 20* 22 22  GLUCOSE 124*   < > 129* 130* 140*  BUN 36*   < > 30* 27* 33*  CREATININE 2.41*   < > 1.55* 1.50* 1.78*  CALCIUM 8.3*   < > 8.1* 8.1* 8.1*  PROT 6.4*  --   --   --   --   ALBUMIN 2.8*  --   --   --   --   AST 21  --   --   --   --   ALT 17  --   --   --   --   ALKPHOS 78  --   --   --   --   BILITOT 1.0  --   --   --   --   GFRNONAA 22*   < > 38*  40* 32*  GFRAA 26*   < > 44* 46* 38*  ANIONGAP 11   < > 11 11 12    < > = values in this interval not displayed.     Hematology Recent Labs  Lab 03/20/19 0211 03/21/19 0229 03/22/19 0241  WBC 12.0* 14.1* 18.7*  RBC 3.42* 3.71* 3.73*  HGB 10.5* 11.4* 11.6*  HCT 32.8* 35.4* 35.6*  MCV 95.9 95.4 95.4  MCH 30.7 30.7 31.1  MCHC 32.0 32.2 32.6  RDW 15.0 15.0 15.1  PLT 225 245 243    Cardiac EnzymesNo results for input(s): TROPONINI in the last 168 hours. No results for input(s): TROPIPOC in the last 168 hours.   BNP Recent Labs  Lab 03/16/19 1157  BNP 434.8*     DDimer  Recent Labs  Lab 03/10/2019 2221  DDIMER 2.22*     Radiology    DG Chest Port 1 View  Result Date: 03/21/2019 CLINICAL DATA:  Cough and drop in oxygen saturation after drinking water. EXAM: PORTABLE CHEST 1 VIEW COMPARISON:  Single-view of the chest 03/20/2019. FINDINGS: Bilateral airspace disease is worse on the left. Aeration in the right mid lung zone is worse than on yesterday's study. There are likely bilateral pleural effusions. No pneumothorax. Cardiac silhouette is largely obscured. Atherosclerosis noted. IMPRESSION: Extensive bilateral airspace disease has worsened in the right mid lung zone since yesterday's  exam. Right greater than left effusions are unchanged. Electronically Signed   By: Inge Rise M.D.   On: 03/21/2019 16:54   DG Chest Port 1 View  Result Date: 03/20/2019 CLINICAL DATA:  Post RIGHT side thoracentesis EXAM: PORTABLE CHEST 1 VIEW COMPARISON:  Portable exam 1149 hours compared to 03/17/2019 FINDINGS: RIGHT subclavian pacemaker leads project over RIGHT atrium and RIGHT ventricle unchanged. Enlargement of cardiac silhouette. Atherosclerotic calcification aorta. BILATERAL pulmonary infiltrates diffusely, significantly increased on LEFT since previous exam. Decreased RIGHT pleural effusion and basilar atelectasis post thoracentesis. No pneumothorax. Small LEFT pleural effusion. Bones demineralized. IMPRESSION: No pneumothorax following RIGHT thoracentesis. Diffuse BILATERAL pulmonary infiltrates question edema versus multifocal pneumonia, significantly increased on LEFT since prior study. Electronically Signed   By: Lavonia Dana M.D.   On: 03/20/2019 12:13   US THORACENTESIS ASP PLEURAL SPACE W/IMG GUIDE  Result Date: 03/20/2019 INDICATION: Shortness of breath, pneumonia. Right-sided pleural effusion. Request diagnostic and therapeutic thoracentesis. EXAM: ULTRASOUND GUIDED RIGHT THORACENTESIS MEDICATIONS: None. COMPLICATIONS: None immediate. PROCEDURE: An ultrasound guided thoracentesis was thoroughly discussed with the patient and questions answered. The benefits, risks, alternatives and complications were also discussed. The patient understands and wishes to proceed with the procedure. Written consent was obtained. Ultrasound was performed to localize and mark an adequate pocket of fluid in the right chest. The area was then prepped and draped in the normal sterile fashion. 1% Lidocaine was used for local anesthesia. Under ultrasound guidance a 6 Fr Safe-T-Centesis catheter was introduced. Thoracentesis was performed. The catheter was removed and a dressing applied. FINDINGS: A total of  approximately 1.1 L of hazy, amber fluid was removed. Samples were sent to the laboratory as requested by the clinical team. IMPRESSION: Successful ultrasound guided right thoracentesis yielding 1.1 L of pleural fluid. Read by: Ascencion Dike PA-C Electronically Signed   By: Markus Daft M.D.   On: 03/20/2019 12:10    Cardiac Studies   Echocardiogram 03/17/19: 1. Left ventricular ejection fraction, by visual estimation, is 65 to 70%. The left ventricle has hyperdynamic function. There is mildly increased left ventricular hypertrophy.  2. The left  ventricle has no regional wall motion abnormalities.  3. Global right ventricle has normal systolic function.The right ventricular size is normal. No increase in right ventricular wall thickness.  4. Left atrial size was normal.  5. Right atrial size was normal.  6. Mild to moderate mitral annular calcification.  7. The mitral valve is normal in structure. No evidence of mitral valve regurgitation. No evidence of mitral stenosis.  8. The tricuspid valve is normal in structure.  9. The tricuspid valve is normal in structure. Tricuspid valve regurgitation is not demonstrated. 10. The aortic valve is normal in structure. Aortic valve regurgitation is not visualized. Mild to moderate aortic valve sclerosis/calcification without any evidence of aortic stenosis. 11. The pulmonic valve was normal in structure. Pulmonic valve regurgitation is not visualized. 12. The inferior vena cava is normal in size with greater than 50% respiratory variability, suggesting right atrial pressure of 3 mmHg.  Patient Profile     86 y.o.malewith a hx of atrial flutter s/p ablation, sick sinus syndrome s/pMedtronicpacemaker, symptomatic PVC on Rythmol, hypertension, chronic kidney disease stage III and hyperlipidemia now admitted with hypoxia and PNA. Consulted for atrial fibrillation.  Assessment & Plan    1. Paroxysmal atrial fibrillation: occurred in the setting of  PNA/acute respiratory failure. Telemetry with NSR with frequent ectopy on diltiazem and propafenone. Rates fairly well controlled. He was started on apixaban 5mg  BID for stroke ppx this admission given CHADSVASCs score of 3 for age and HTN - Continue diltiazem 360mg  daily for rate control - Could consider addition of carvedilol for rate control - Continue propafenone for rhythm control - Continue apixaban for stroke ppx  2. HTN: BP is predominantly above goal of <130/80. Home amlodipine discontinued in lieu of diltiazem for rate control. CKD limits addition of ACEi/ARB. He has a PPM in place given hx of SSS.  - Continue diltiazem - Will consider addition of carvedilol for added rate control/ HTN management if BP is persistently elevated - favor permissive HTN at this time.   3. HLD: LDL 91 08/2018 - Continue atorvastatin  4. Acute respiratory failure 2/2 CAP: patient presented wit SOB, found to have a large right and moderate left pleural effusion and LUL PNA on CTA Chest. Started on IV antibiotics and supportive care with nebulizers.  - Continue management per primary team.   Prognosis was felt to be poor and palliative care was consulted this admission with plans to discuss outpatient follow-up vs hospice pending progress in the coming days.    For questions or updates, please contact [place] Please consult [email or web address] for contact info under Cardiology/STEMI.      Signed, Abigail Butts, PA-C  03/22/2019, 8:00 AM   [phone]

## 2019-03-22 NOTE — Progress Notes (Signed)
SLP Cancellation Note  Patient Details Name: Stanley Hunter MRN: [id number] DOB: Jun 06, 1924   Cancelled treatment:       Reason Eval/Treat Not Completed: Other (comment)(note plans for comfort care)   Macario Golds 03/22/2019, 11:14 AM  Kathleen Lime, MS [place] Office [phone]

## 2019-03-22 NOTE — Progress Notes (Addendum)
Pharmacy Antibiotic Note  Stanley Hunter is a 86 y.o. male admitted on 03/12/2019 with ShOB, fever, initially started on Ceftriaxone & Azithromycin. Concern for aspiration PNA, so Ceftriaxone changed to Unasyn on 03/18/2019. Vancomycin also added 03/18/2019. S/p thoracentesis today for large R pleural effusion. Unasyn now being switched to Cefepime. Pharmacy consulted for Cefepime and Vancomycin dosing.   Today, 03/22/19  Day 7 total abxs - D7 zithro, D5 vanc, D3 cefepime  WBC rising  SCr rising - CrCl 27   Plan:  Due to rising SCr, will stop vanc for now and check a vanc trough this PM and reassess dosing after that  Change cefepime from 2g IV q12 to 2g IV q24 due to CrCl now < 30 ml/min  Continue Azithromycin 500mg  PO daily per MD   Monitor renal function, cultures, clinical course, duration of therapy   Height: 5\' 9"  (175.3 cm) Weight: 163 lb 5.8 oz (74.1 kg) IBW/kg (Calculated) : 70.7  Temp (24hrs), Avg:96.8 F (36 C), Min:95.9 F (35.5 C), Max:97.5 F (36.4 C)  Recent Labs  Lab 03/14/2019 2221 03/16/19 1115 03/16/19 1759 03/17/19 0129 03/17/19 0129 03/18/19 0127 03/02/2019 0147 03/20/19 0211 03/21/19 0229 03/22/19 0241  WBC 11.9*   < >  --  10.5  --   --  11.2* 12.0* 14.1* 18.7*  CREATININE 2.41*   < >  --  1.33*   < > 1.32* 1.26* 1.55* 1.50* 1.78*  LATICACIDVEN 1.7  --  1.3  --   --   --   --   --   --   --    < > = values in this interval not displayed.    Estimated Creatinine Clearance: 26.5 mL/min (A) (by C-G formula based on SCr of 1.78 mg/dL (H)).    Allergies  Allergen Reactions  . Cortisone Other (See Comments)    Unknown reaction  . Neomycin Rash  . Sulfamethoxazole-Trimethoprim Other (See Comments)    Leg weakness, arrhythmia   Antimicrobials this admission: 1/19 Doxycycline x 1 1/19 Azithromycin >> 1/19 Ceftriaxone >> 1/21 1/21 Unasyn >> 1/23 1/21 Vancomycin >> 1/23 Cefepime >>   Microbiology results: 1/18 COVID: neg 1/18 Influenza A/B:  neg 1/18 BCx2: 1/4 bottles CoNS 1/19 HIV NR 1/19 MRSA PCR + 1/19 Strep pneumo ur ag: neg 1/19 BCx: NGTD 1/19 I/O cath UCx: 2K Staph warneri 1/20 COVID: neg 1/22 Legionella ur ag: IP  1/23 Pleural fluid cx: sent 1/23 fungus cx (pleural fluid): sent 1/23 AFB: sent 1/23 acid fast culture (pleural fluid): sent   Thank you for allowing pharmacy to be a part of this patient's care.   Adrian Saran, PharmD, BCPS 03/22/2019 9:15 AM

## 2019-03-22 NOTE — Progress Notes (Signed)
PT Cancellation Note  Patient Details Name: Stanley Hunter MRN: [id number] DOB: 06-18-1924   Cancelled Treatment:    Reason Eval/Treat Not Completed: Other (comment)  Noted pt now comfort care and per palliative note, hospital death expected.  Will sign off PT.  Maggie Font, PT Acute Rehab Services Pager [phone] [facility] [place] [facility] [place] 03/22/2019, 1:57 PM

## 2019-03-22 NOTE — Progress Notes (Signed)
Called medtronic about pacemaker. They stated that we didn't need to turn it off due to end of life care. They only turn ICDs off during EOL care.

## 2019-03-22 NOTE — Progress Notes (Signed)
OT Cancellation Note  Patient Details Name: Stanley Hunter MRN: [id number] DOB: 24-Oct-1924   Cancelled Treatment:    Reason Eval/Treat Not Completed: Other (comment)  Noted plan for [facility]- will sign off Kari Baars, OT Acute Rehabilitation Services [phone] Office- [phone], Thereasa Parkin 03/22/2019, 5:49 PM

## 2019-03-22 NOTE — Progress Notes (Signed)
Pt moaning and getting agitated. MD paged for prn orders to help alleviate symptoms.

## 2019-03-23 LAB — CYTOLOGY - NON PAP

## 2019-03-24 LAB — BODY FLUID CULTURE
Culture: NO GROWTH
Gram Stain: NONE SEEN

## 2019-03-29 NOTE — Progress Notes (Signed)
Morphine 76mL wasted with B. May, RN.

## 2019-03-29 NOTE — Death Summary Note (Signed)
Death Summary  [name] [id number] DOB: 1925/01/15 DOA: 25-Mar-2019  PCP: Mast, Man X, NP  Admit date: 2019/03/25 Date of Death: Apr 02, 2019 Time of Death: 2:36 am Notification: Mast, Man X, NP notified of death of 04/02/2019   History of present illness:  Stanley Hunter a 86 y.o.malewith medical history significant ofcoronary artery disease status post. Cardiac catheterization, chronic kidney disease stage III, essential hypertension, degenerative disc joint disease, history of pacemaker placement 2015, who had his Covid vaccine2 weeks agoand presents with shortness of breath, cough and fever. Patient also has lower extremity edema. He was found to be hypoxic. Chest x-ray showed lobar pneumonia. In the ED, patient noted to be hypoxic, Covid test negative. Patient admitted for further management.  Patient continued to decline during the course of this admission, requiring more more oxygen, despite aggressive treatment with antibiotics, Lasix, nebulizers.  Patient already had a living will and has been a DNR prior to this admission, which was confirmed by his wife.  Discussed with family and decision was made to switch over to comfort care.  Patient passed away on 04/02/2019 at 2:36 AM  Final Diagnoses:   Acute hypoxic respiratory failure likely 2/2 CAP complicated by large R pleural effusion (parapneumonic effusion) Urine strep pneumo negative, Legionella negative COVID-19, influenza panel negative ABG with hypoxia Initial chest x-ray with right basilar infiltrate CTA chest showed no PE, large right pleural effusion with complete atelectasis involving the right upper lobe, moderate left pleural effusion with left upper lobe airspace opacity Status post US guided right thoracentesis on 03/20/2019, which yielded 1.1 L of hazy amber fluid, likely exudative, repeat chest x-ray with no pneumothorax, diffuse bilateral pulmonary infiltrates possibly edema versus multifocal pneumonia S/P  ceftriaxone/IV Unasyn, cefepime, azithromycin, vancomycin (MRSA PCR +) S/P IV lasix 40 mg Switch to comfort care after extensive discussion with family  A. fib with RVR Had previous flutter ablation, pacemaker implantation secondary to SSS Venous Dopplers lower extremity negative for DVT Cardiology consulted, S/p Cardizem drip, p.o Cardizem, propafenone, Eliquis Switched to comfort care  ??UTI Urine culture grew Staphylococcus warneri, 2000 colonies S/P vancomycin as above  ?CHF Elevated BNP, 434.8 Echo with EF of 65 to 70%, no regional wall motion abnormality S/P IV Lasix  AKI on CKD stage IIIb Baseline creatinine 1.9  GOC Very poor prognosis, palliative consulted Discussed with wife and daughter extensively, agreed for comfort care measures   The results of significant diagnostics from this hospitalization (including imaging, microbiology, ancillary and laboratory) are listed below for reference.    Significant Diagnostic Studies: CT ANGIO CHEST PE W OR WO CONTRAST  Result Date: 03/18/2019 CLINICAL DATA:  Shortness of breath. EXAM: CT ANGIOGRAPHY CHEST WITH CONTRAST TECHNIQUE: Multidetector CT imaging of the chest was performed using the standard protocol during bolus administration of intravenous contrast. Multiplanar CT image reconstructions and MIPs were obtained to evaluate the vascular anatomy. CONTRAST:  16mL OMNIPAQUE IOHEXOL 350 MG/ML SOLN COMPARISON:  None. FINDINGS: Cardiovascular: Satisfactory opacification of the pulmonary arteries to the segmental level. No evidence of pulmonary embolism. Mild cardiomegaly is noted. Atherosclerosis of thoracic aorta is noted without aneurysm or dissection. No pericardial effusion. Mediastinum/Nodes: Large sliding-type hiatal hernia is noted. No adenopathy is noted. Thyroid gland is unremarkable. Lungs/Pleura: Large right pleural effusion is noted with complete atelectasis of the right lower lobe and significant atelectasis  involving the right upper lobe. Moderate left pleural effusion is noted with adjacent subsegmental atelectasis. No pneumothorax is noted. Left upper lobe airspace opacity is noted concerning for  pneumonia. Upper Abdomen: No acute abnormality. Musculoskeletal: No chest wall abnormality. No acute or significant osseous findings. Review of the MIP images confirms the above findings. IMPRESSION: 1. No definite evidence of pulmonary embolus. 2. Large right pleural effusion is noted with complete atelectasis of the right lower lobe and significant atelectasis involving the right upper lobe. 3. Moderate left pleural effusion is noted with adjacent subsegmental atelectasis. 4. Left upper lobe airspace opacity is noted concerning for pneumonia. 5. Large sliding-type hiatal hernia is noted. Aortic Atherosclerosis (ICD10-I70.0). Electronically Signed   By: Marijo Conception M.D.   On: 03/20/2019 15:48   DG Chest Port 1 View  Result Date: 03/21/2019 CLINICAL DATA:  Cough and drop in oxygen saturation after drinking water. EXAM: PORTABLE CHEST 1 VIEW COMPARISON:  Single-view of the chest 03/20/2019. FINDINGS: Bilateral airspace disease is worse on the left. Aeration in the right mid lung zone is worse than on yesterday's study. There are likely bilateral pleural effusions. No pneumothorax. Cardiac silhouette is largely obscured. Atherosclerosis noted. IMPRESSION: Extensive bilateral airspace disease has worsened in the right mid lung zone since yesterday's exam. Right greater than left effusions are unchanged. Electronically Signed   By: Inge Rise M.D.   On: 03/21/2019 16:54   DG Chest Port 1 View  Result Date: 03/20/2019 CLINICAL DATA:  Post RIGHT side thoracentesis EXAM: PORTABLE CHEST 1 VIEW COMPARISON:  Portable exam 1149 hours compared to 03/17/2019 FINDINGS: RIGHT subclavian pacemaker leads project over RIGHT atrium and RIGHT ventricle unchanged. Enlargement of cardiac silhouette. Atherosclerotic calcification  aorta. BILATERAL pulmonary infiltrates diffusely, significantly increased on LEFT since previous exam. Decreased RIGHT pleural effusion and basilar atelectasis post thoracentesis. No pneumothorax. Small LEFT pleural effusion. Bones demineralized. IMPRESSION: No pneumothorax following RIGHT thoracentesis. Diffuse BILATERAL pulmonary infiltrates question edema versus multifocal pneumonia, significantly increased on LEFT since prior study. Electronically Signed   By: Lavonia Dana M.D.   On: 03/20/2019 12:13   DG Chest Port 1 View  Result Date: 03/17/2019 CLINICAL DATA:  History of COVID-19 positivity with cough and fever EXAM: PORTABLE CHEST 1 VIEW COMPARISON:  03/14/2019 FINDINGS: Increasing right basilar infiltrate is noted with associated effusion. Cardiomegaly is again noted. Pacing device is stable. Left lung is clear. No bony abnormality is noted. IMPRESSION: Increasing right basilar infiltrate and effusion. Electronically Signed   By: Inez Catalina M.D.   On: 03/17/2019 14:26   DG Chest Port 1 View  Result Date: 03/06/2019 CLINICAL DATA:  Fever and shortness of breath. EXAM: PORTABLE CHEST 1 VIEW COMPARISON:  In April 21, 2016 FINDINGS: There is a dual lead AICD. Mild infiltrate is seen along the medial aspect of the right lung base. There is elevation of the right hemidiaphragm. No pleural effusion or pneumothorax is identified. The cardiac silhouette is moderately enlarged. Marked severity calcification of the aortic arch is seen. Multilevel degenerative changes are noted throughout the thoracic spine. IMPRESSION: 1. Mild right basilar infiltrate. 2. Cardiomegaly. Electronically Signed   By: Virgina Norfolk M.D.   On: 03/18/2019 22:46   DG Swallowing Func-Speech Pathology  Result Date: 03/05/2019 Objective Swallowing Evaluation: Type of Study: MBS-Modified Barium Swallow Study  Patient Details Name: Yitzchak Aldaz MRN: [id number] Date of Birth: 1924-05-14 Today's Date: 03/11/2019 Time: SLP Start Time  (ACUTE ONLY): 0836 -SLP Stop Time (ACUTE ONLY): 0855 SLP Time Calculation (min) (ACUTE ONLY): 19 min Past Medical History: Past Medical History: Diagnosis Date . Acid reflux 08/03/2015 . Anxiety 08/03/2015 . Avitaminosis D 05/18/2012 . Benign essential HTN 02/09/2009  Overview:  Benign Essential Hypertension  . Benign prostatic hyperplasia with urinary obstruction 12/07/2013 . Chronic kidney disease (CKD), stage III (moderate) 11/04/2011 . CN (constipation) 08/03/2015 . Cognitive changes 08/07/2015 . Degenerative arthritis of hip 05/14/2013 . Degenerative arthritis of spine 05/14/2013 . Degenerative disorder of muscle 08/03/2015 . Dysrhythmia 2001  ablation for a-flutter . H/O atrial flutter 08/03/2015  Overview:  Pacemaker placed 2015  . High blood pressure  . High cholesterol  . History of pacemaker 2016 . Hx of cardiac cath 2013 . Loss of balance 08/07/2015 . Lumbar canal stenosis 07/07/2013  Overview:  Lumbar laminectomy 07/07/13 - Dr. Lurene Shadow  . Lumbar scoliosis 05/14/2013 . Macular degeneration 08/07/2015 . Premature contractions, supraventricular 12/15/2008  Overview:  Atrial Premature Complex  . Presence of permanent cardiac pacemaker  . Tremor 08/07/2015  hands . Urinary frequency 08/07/2015 Past Surgical History: Past Surgical History: Procedure Laterality Date . ATRIAL FLUTTER ABLATION  2001 . CATARACT EXTRACTION Bilateral 2015 . CHOLECYSTECTOMY   . COLONOSCOPY   . MASTOIDECTOMY  1934 . NASAL SEPTOPLASTY W/ TURBINOPLASTY Bilateral 10/14/2018  Procedure: NASAL SEPTOPLASTY WITH BILATERAL  TURBINATE REDUCTION;  Surgeon: Leta Baptist, MD;  Location: [place];  Service: ENT;  Laterality: Bilateral; . PACEMAKER PLACEMENT  2016 . SPINE SURGERY  2016  LUMBER- lower . TONSILLECTOMY  1934 . TRIGGER FINGER RELEASE Left 12/14/2015  Procedure: RELEASE TRIGGER FINGER/A-1 PULLEY ring finger left;  Surgeon: Daryll Brod, MD;  Location: [place];  Service: Orthopedics;  Laterality: Left;  FAB HPI: 86 yo male adm [facility] 2 days previously  with difficulty breathing and AMS. Pt found to have pna - CXR today showing progression which is concerning.  Swallow eval ordered.  Pt with PMH + for history significant of coronary artery disease status post. Cardiac catheterization, chronic kidney disease stage III, essential hypertension, degenerative disc joint disease, sinusitis s/p septum surgery in 2020, "Memory difficulty" per Dr Bubba Camp in snapshot, history of pacemaker placement 2015, GERD, who had his Covid vaccine 2 weeks prior to admission, Chest x-ray showed lobar pneumonia.  Concern for aspiration present and swallow eval ordered.  Subjective: pt awake in chair, reports his breathing is better Assessment / Plan / Recommendation CHL IP CLINICAL IMPRESSIONS 03/07/2019 Clinical Impression Patient presents with mild pharyngoesophageal dysphagia with decreased CP/UES relaxation/opening resulting in minimal backflow of barium to distal pharynx.  No aspiration or penetration observed and swallow was timely and strong.  Pt does appear with anterior protrusion of cervical spine at C3-C4 into pharynx which at times diminished epiglottic deflection.   Pt consumed thin via straw sequentially with adequate protection of airway.  Adequate laryngeal elevation/closure observed.  Of note, pt reported sensation of residuals in pharynx x1 after cracker bolus swallow.   Upon esophageal sweep after liquid consumption, he appeared with some retrograde propulsion of thin - ? due to dysmotility.  Recommend continue diet as tolerated being mindful of pt's work of breathing due to risk of impairing reciprocity of swallow/breathing. SLP to follow briefly for esophageal/respiratory precautions.  Thanks for this consult. SLP Visit Diagnosis Dysphagia, pharyngoesophageal phase (R13.14) Attention and concentration deficit following -- Frontal lobe and executive function deficit following -- Impact on safety and function Mild aspiration risk   CHL IP TREATMENT RECOMMENDATION  02/26/2019 Treatment Recommendations Therapy as outlined in treatment plan below   Prognosis 03/24/2019 Prognosis for Safe Diet Advancement Fair Barriers to Reach Goals Other (Comment) Barriers/Prognosis Comment -- CHL IP DIET RECOMMENDATION 03/09/2019 SLP Diet Recommendations (No Data) Liquid Administration via --  Medication Administration (No Data) Compensations (No Data) Postural Changes Remain semi-upright after after feeds/meals (Comment);Seated upright at 90 degrees   CHL IP OTHER RECOMMENDATIONS 03/09/2019 Recommended Consults -- Oral Care Recommendations Oral care before and after PO Other Recommendations Have oral suction available   CHL IP FOLLOW UP RECOMMENDATIONS 03/08/2019 Follow up Recommendations None   CHL IP FREQUENCY AND DURATION 03/27/2019 Speech Therapy Frequency (ACUTE ONLY) min 2x/week Treatment Duration 2 weeks      CHL IP ORAL PHASE 03/13/2019 Oral Phase Impaired Oral - Pudding Teaspoon -- Oral - Pudding Cup -- Oral - Honey Teaspoon -- Oral - Honey Cup -- Oral - Nectar Teaspoon NT Oral - Nectar Cup -- Oral - Nectar Straw WFL Oral - Thin Teaspoon WFL Oral - Thin Cup -- Oral - Thin Straw WFL;Piecemeal swallowing;Other (Comment) Oral - Puree WFL Oral - Mech Soft -- Oral - Regular WFL Oral - Multi-Consistency -- Oral - Pill -- Oral Phase - Comment --  CHL IP PHARYNGEAL PHASE 03/27/2019 Pharyngeal Phase Impaired Pharyngeal- Pudding Teaspoon -- Pharyngeal -- Pharyngeal- Pudding Cup -- Pharyngeal -- Pharyngeal- Honey Teaspoon -- Pharyngeal -- Pharyngeal- Honey Cup -- Pharyngeal -- Pharyngeal- Nectar Teaspoon -- Pharyngeal -- Pharyngeal- Nectar Cup -- Pharyngeal -- Pharyngeal- Nectar Straw WFL Pharyngeal Material does not enter airway Pharyngeal- Thin Teaspoon WFL Pharyngeal Material does not enter airway Pharyngeal- Thin Cup -- Pharyngeal -- Pharyngeal- Thin Straw WFL Pharyngeal Material does not enter airway Pharyngeal- Puree WFL Pharyngeal Material does not enter airway Pharyngeal- Mechanical Soft --  Pharyngeal -- Pharyngeal- Regular WFL Pharyngeal Material does not enter airway Pharyngeal- Multi-consistency -- Pharyngeal -- Pharyngeal- Pill -- Pharyngeal -- Pharyngeal Comment --  CHL IP CERVICAL ESOPHAGEAL PHASE 03/06/2019 Cervical Esophageal Phase Impaired Pudding Teaspoon -- Pudding Cup -- Honey Teaspoon -- Honey Cup -- Nectar Teaspoon -- Nectar Cup -- Nectar Straw Reduced cricopharyngeal relaxation;Esophageal backflow into the pharynx;Prominent cricopharyngeal segment Thin Teaspoon Reduced cricopharyngeal relaxation;Esophageal backflow into the pharynx;Prominent cricopharyngeal segment Thin Cup -- Thin Straw Reduced cricopharyngeal relaxation;Esophageal backflow into the pharynx;Prominent cricopharyngeal segment Puree Reduced cricopharyngeal relaxation;Esophageal backflow into the pharynx;Prominent cricopharyngeal segment Mechanical Soft -- Regular Reduced cricopharyngeal relaxation;Esophageal backflow into the pharynx;Prominent cricopharyngeal segment Multi-consistency -- Pill -- Cervical Esophageal Comment -- Kathleen Lime, MS [facility] SLP Acute Rehab Services Office [phone] Macario Golds 03/24/2019, 9:54 AM              ECHOCARDIOGRAM COMPLETE  Result Date: 03/17/2019   ECHOCARDIOGRAM REPORT   Patient Name:   Stanley Hunter Date of Exam: 03/17/2019 Medical Rec #:  [id number]    Height:       69.0 in Accession #:    [id number]   Weight:       163.4 lb Date of Birth:  04/04/1924     BSA:          1.90 m Patient Age:    8 years     BP:           141/74 mmHg Patient Gender: M            HR:           125 bpm. Exam Location:  Inpatient Procedure: 2D Echo Indications:    427.31 atrial fibrillation  History:        Patient has no prior history of Echocardiogram examinations.                 Pacemaker, Arrythmias:Atrial Flutter; Risk Factors:Hypertension,  Dyslipidemia and Former Smoker.  Sonographer:    Jannett Celestine RDCS (AE) Referring Phys: [id number] RIPUDEEP K RAI  Sonographer Comments: restricted  mobility IMPRESSIONS  1. Left ventricular ejection fraction, by visual estimation, is 65 to 70%. The left ventricle has hyperdynamic function. There is mildly increased left ventricular hypertrophy.  2. The left ventricle has no regional wall motion abnormalities.  3. Global right ventricle has normal systolic function.The right ventricular size is normal. No increase in right ventricular wall thickness.  4. Left atrial size was normal.  5. Right atrial size was normal.  6. Mild to moderate mitral annular calcification.  7. The mitral valve is normal in structure. No evidence of mitral valve regurgitation. No evidence of mitral stenosis.  8. The tricuspid valve is normal in structure.  9. The tricuspid valve is normal in structure. Tricuspid valve regurgitation is not demonstrated. 10. The aortic valve is normal in structure. Aortic valve regurgitation is not visualized. Mild to moderate aortic valve sclerosis/calcification without any evidence of aortic stenosis. 11. The pulmonic valve was normal in structure. Pulmonic valve regurgitation is not visualized. 12. The inferior vena cava is normal in size with greater than 50% respiratory variability, suggesting right atrial pressure of 3 mmHg. FINDINGS  Left Ventricle: Left ventricular ejection fraction, by visual estimation, is 65 to 70%. The left ventricle has hyperdynamic function. The left ventricle has no regional wall motion abnormalities. There is mildly increased left ventricular hypertrophy. Normal left atrial pressure. Right Ventricle: The right ventricular size is normal. No increase in right ventricular wall thickness. Global RV systolic function is has normal systolic function. Left Atrium: Left atrial size was normal in size. Right Atrium: Right atrial size was normal in size Pericardium: There is no evidence of pericardial effusion. Mitral Valve: The mitral valve is normal in structure. Mild to moderate mitral annular calcification. No evidence of  mitral valve regurgitation. No evidence of mitral valve stenosis by observation. Tricuspid Valve: The tricuspid valve is normal in structure. Tricuspid valve regurgitation is not demonstrated. Aortic Valve: The aortic valve is normal in structure. Aortic valve regurgitation is not visualized. Mild to moderate aortic valve sclerosis/calcification is present, without any evidence of aortic stenosis. Pulmonic Valve: The pulmonic valve was normal in structure. Pulmonic valve regurgitation is not visualized. Pulmonic regurgitation is not visualized. Aorta: The aortic root, ascending aorta and aortic arch are all structurally normal, with no evidence of dilitation or obstruction. Venous: The inferior vena cava is normal in size with greater than 50% respiratory variability, suggesting right atrial pressure of 3 mmHg. IAS/Shunts: No atrial level shunt detected by color flow Doppler. There is no evidence of a patent foramen ovale. No ventricular septal defect is seen or detected. There is no evidence of an atrial septal defect.  LEFT VENTRICLE PLAX 2D LVIDd:         3.70 cm LVIDs:         2.30 cm LV PW:         1.40 cm LV IVS:        1.00 cm LVOT diam:     2.30 cm LV SV:         40 ml LV SV Index:   20.99 LVOT Area:     4.15 cm  LEFT ATRIUM             Index LA diam:        3.30 cm 1.74 cm/m LA Vol (A2C):   38.9 ml 20.52 ml/m LA Vol (A4C):  44.8 ml 23.63 ml/m LA Biplane Vol: 44.3 ml 23.37 ml/m  AORTIC VALVE LVOT Vmax:   85.80 cm/s LVOT Vmean:  68.000 cm/s LVOT VTI:    0.154 m  AORTA Ao Root diam: 3.30 cm  SHUNTS Systemic VTI:  0.15 m Systemic Diam: 2.30 cm  Candee Furbish MD Electronically signed by Candee Furbish MD Signature Date/Time: 03/17/2019/3:29:30 PM    Final    VAS [place] LOWER EXTREMITY VENOUS (DVT)  Result Date: 03/16/2019  Lower Venous Study Indications: Edema.  Risk Factors: None identified. Limitations: Patient positioning, patient movement. Comparison Study: No prior studies. Performing Technologist: Oliver Hum RVT  Examination Guidelines: A complete evaluation includes B-mode imaging, spectral Doppler, color Doppler, and power Doppler as needed of all accessible portions of each vessel. Bilateral testing is considered an integral part of a complete examination. Limited examinations for reoccurring indications may be performed as noted.  +---------+---------------+---------+-----------+----------+--------------+ RIGHT    CompressibilityPhasicitySpontaneityPropertiesThrombus Aging +---------+---------------+---------+-----------+----------+--------------+ CFV      Full           Yes      Yes                                 +---------+---------------+---------+-----------+----------+--------------+ SFJ      Full                                                        +---------+---------------+---------+-----------+----------+--------------+ FV Prox  Full                                                        +---------+---------------+---------+-----------+----------+--------------+ FV Mid   Full                                                        +---------+---------------+---------+-----------+----------+--------------+ FV DistalFull                                                        +---------+---------------+---------+-----------+----------+--------------+ PFV      Full                                                        +---------+---------------+---------+-----------+----------+--------------+ POP      Full           Yes      Yes                                 +---------+---------------+---------+-----------+----------+--------------+ PTV      Full                                                        +---------+---------------+---------+-----------+----------+--------------+  PERO     Full                                                        +---------+---------------+---------+-----------+----------+--------------+    +---------+---------------+---------+-----------+----------+--------------+ LEFT     CompressibilityPhasicitySpontaneityPropertiesThrombus Aging +---------+---------------+---------+-----------+----------+--------------+ CFV      Full           Yes      Yes                                 +---------+---------------+---------+-----------+----------+--------------+ SFJ      Full                                                        +---------+---------------+---------+-----------+----------+--------------+ FV Prox  Full                                                        +---------+---------------+---------+-----------+----------+--------------+ FV Mid   Full                                                        +---------+---------------+---------+-----------+----------+--------------+ FV DistalFull                                                        +---------+---------------+---------+-----------+----------+--------------+ PFV      Full                                                        +---------+---------------+---------+-----------+----------+--------------+ POP      Full           Yes      Yes                                 +---------+---------------+---------+-----------+----------+--------------+ PTV      Full                                                        +---------+---------------+---------+-----------+----------+--------------+ PERO     Full                                                        +---------+---------------+---------+-----------+----------+--------------+  Summary: Right: There is no evidence of deep vein thrombosis in the lower extremity. No cystic structure found in the popliteal fossa. Left: There is no evidence of deep vein thrombosis in the lower extremity. No cystic structure found in the popliteal fossa.  *See table(s) above for measurements and observations. Electronically signed by  Deitra Mayo MD on 03/16/2019 at 12:51:29 PM.    Final    US THORACENTESIS ASP PLEURAL SPACE W/IMG GUIDE  Result Date: 03/20/2019 INDICATION: Shortness of breath, pneumonia. Right-sided pleural effusion. Request diagnostic and therapeutic thoracentesis. EXAM: ULTRASOUND GUIDED RIGHT THORACENTESIS MEDICATIONS: None. COMPLICATIONS: None immediate. PROCEDURE: An ultrasound guided thoracentesis was thoroughly discussed with the patient and questions answered. The benefits, risks, alternatives and complications were also discussed. The patient understands and wishes to proceed with the procedure. Written consent was obtained. Ultrasound was performed to localize and mark an adequate pocket of fluid in the right chest. The area was then prepped and draped in the normal sterile fashion. 1% Lidocaine was used for local anesthesia. Under ultrasound guidance a 6 Fr Safe-T-Centesis catheter was introduced. Thoracentesis was performed. The catheter was removed and a dressing applied. FINDINGS: A total of approximately 1.1 L of hazy, amber fluid was removed. Samples were sent to the laboratory as requested by the clinical team. IMPRESSION: Successful ultrasound guided right thoracentesis yielding 1.1 L of pleural fluid. Read by: Ascencion Dike PA-C Electronically Signed   By: Markus Daft M.D.   On: 03/20/2019 12:10    Microbiology: Recent Results (from the past 240 hour(s))  Blood Culture (routine x 2)     Status: None   Collection Time: 03/09/2019 10:21 PM   Specimen: BLOOD  Result Value Ref Range Status   Specimen Description   Final    BLOOD BLOOD RIGHT ARM Performed at [facility] [address] [place], [place] [address]    Special Requests   Final    BOTTLES DRAWN AEROBIC AND ANAEROBIC Blood Culture adequate volume Performed at [facility] [address], [place], [place] [address]    Culture   Final    NO GROWTH 5 DAYS Performed at [facility], [place] [address] [place], [place] [address]    Report Status 03/21/2019 FINAL  Final  Blood Culture (routine x 2)     Status: Abnormal   Collection Time: 03/22/2019 10:26 PM   Specimen: BLOOD  Result Value Ref Range Status   Specimen Description   Final    BLOOD BLOOD LEFT HAND Performed at [facility] [address], [place], [place] [address]    Special Requests   Final    BOTTLES DRAWN AEROBIC AND ANAEROBIC Blood Culture adequate volume Performed at [facility] [address], [place], [place] [address]    Culture  Setup Time   Final    ANAEROBIC BOTTLE ONLY GRAM POSITIVE COCCI CRITICAL RESULT CALLED TO, READ BACK BY AND VERIFIED WITH: B GREEN PHARMD 03/17/19 0137 JDW    Culture (A)  Final    STAPHYLOCOCCUS SPECIES (COAGULASE NEGATIVE) THE SIGNIFICANCE OF ISOLATING THIS ORGANISM FROM A SINGLE SET OF BLOOD CULTURES WHEN MULTIPLE SETS ARE DRAWN IS UNCERTAIN. PLEASE NOTIFY THE MICROBIOLOGY DEPARTMENT WITHIN ONE WEEK IF SPECIATION AND SENSITIVITIES ARE REQUIRED. Performed at [facility], [place] [address] [place], [place] [address]    Report Status 03/18/2019 FINAL  Final  Respiratory Panel by RT PCR (Flu A&B, Covid) - Nasopharyngeal Swab  Status: None   Collection Time: 03/10/2019 11:01 PM   Specimen: Nasopharyngeal Swab  Result Value Ref Range Status   SARS Coronavirus 2 by RT PCR NEGATIVE NEGATIVE Final    Comment: (NOTE) SARS-CoV-2 target nucleic acids are NOT DETECTED. The SARS-CoV-2 RNA is generally detectable in upper respiratoy specimens during the acute phase of infection. The lowest concentration of SARS-CoV-2 viral copies this assay can detect is 131 copies/mL. A negative result does not preclude SARS-Cov-2 infection and should not be used as the sole basis for treatment or other patient management decisions. A negative result may occur with  improper specimen collection/handling, submission of specimen other than  nasopharyngeal swab, presence of viral mutation(s) within the areas targeted by this assay, and inadequate number of viral copies (<131 copies/mL). A negative result must be combined with clinical observations, patient history, and epidemiological information. The expected result is Negative. Fact Sheet for Patients:  PinkCheek.be Fact Sheet for Healthcare Providers:  GravelBags.it This test is not yet ap proved or cleared by the [place] FDA and  has been authorized for detection and/or diagnosis of SARS-CoV-2 by FDA under an Emergency Use Authorization (EUA). This EUA will remain  in effect (meaning this test can be used) for the duration of the COVID-19 declaration under Section 564(b)(1) of the Act, 21 U.S.C. section 360bbb-3(b)(1), unless the authorization is terminated or revoked sooner.    Influenza A by PCR NEGATIVE NEGATIVE Final   Influenza B by PCR NEGATIVE NEGATIVE Final    Comment: (NOTE) The Xpert Xpress SARS-CoV-2/FLU/RSV assay is intended as an aid in  the diagnosis of influenza from Nasopharyngeal swab specimens and  should not be used as a sole basis for treatment. Nasal washings and  aspirates are unacceptable for Xpert Xpress SARS-CoV-2/FLU/RSV  testing. Fact Sheet for Patients: PinkCheek.be Fact Sheet for Healthcare Providers: GravelBags.it This test is not yet approved or cleared by the [place] FDA and  has been authorized for detection and/or diagnosis of SARS-CoV-2 by  FDA under an Emergency Use Authorization (EUA). This EUA will remain  in effect (meaning this test can be used) for the duration of the  Covid-19 declaration under Section 564(b)(1) of the Act, 21  U.S.C. section 360bbb-3(b)(1), unless the authorization is  terminated or revoked. Performed at [facility], [place] [address], [place], [place] [address]    Urine culture     Status: Abnormal   Collection Time: 03/16/19  2:22 AM   Specimen: In/Out Cath Urine  Result Value Ref Range Status   Specimen Description   Final    IN/OUT CATH URINE Performed at [facility] [address], [place], [place] [address]    Special Requests   Final    NONE Performed at [facility], [place] [address], [place], [place] [address]    Culture 2,000 COLONIES/mL STAPHYLOCOCCUS WARNERI (A)  Final   Report Status 03/17/2019 FINAL  Final   Organism ID, Bacteria STAPHYLOCOCCUS WARNERI (A)  Final      Susceptibility   Staphylococcus warneri - MIC*    CIPROFLOXACIN <=0.5 SENSITIVE Sensitive     GENTAMICIN <=0.5 SENSITIVE Sensitive     NITROFURANTOIN 32 SENSITIVE Sensitive     OXACILLIN <=0.25 SENSITIVE Sensitive     TETRACYCLINE >=16 RESISTANT Resistant     VANCOMYCIN <=0.5 SENSITIVE Sensitive     TRIMETH/SULFA <=10 SENSITIVE Sensitive     CLINDAMYCIN RESISTANT Resistant     RIFAMPIN <=0.5 SENSITIVE Sensitive  Inducible Clindamycin POSITIVE Resistant     * 2,000 COLONIES/mL STAPHYLOCOCCUS WARNERI  MRSA PCR Screening     Status: Abnormal   Collection Time: 03/16/19  4:53 PM   Specimen: Nasopharyngeal  Result Value Ref Range Status   MRSA by PCR POSITIVE (A) NEGATIVE Final    Comment:        The GeneXpert MRSA Assay (FDA approved for NASAL specimens only), is one component of a comprehensive MRSA colonization surveillance program. It is not intended to diagnose MRSA infection nor to guide or monitor treatment for MRSA infections. RESULT CALLED TO, READ BACK BY AND VERIFIED WITH: GARLAND,G @ 2020 ON [id number] BY POTEAT,S Performed at [facility], [place] [address] [place], [place] [address]   Culture, blood (routine x 2) Call MD if unable to obtain prior to antibiotics being given     Status: None   Collection Time: 03/16/19  5:59 PM   Specimen: BLOOD RIGHT ARM  Result Value Ref Range Status    Specimen Description   Final    BLOOD RIGHT ARM Performed at [facility] [address] [place], [place] [address]    Special Requests   Final    BOTTLES DRAWN AEROBIC ONLY Blood Culture adequate volume Performed at [facility] [address] [place], [place] [address]    Culture   Final    NO GROWTH 5 DAYS Performed at [facility], [place] [address], [place], [place] [address]    Report Status 03/21/2019 FINAL  Final  Culture, blood (routine x 2) Call MD if unable to obtain prior to antibiotics being given     Status: None   Collection Time: 03/16/19  5:59 PM   Specimen: BLOOD  Result Value Ref Range Status   Specimen Description   Final    BLOOD RIGHT ANTECUBITAL Performed at [facility] [address] [place], [place] [address]    Special Requests   Final    BOTTLES DRAWN AEROBIC AND ANAEROBIC Blood Culture adequate volume Performed at [facility] [address] [place], [place] [address]    Culture   Final    NO GROWTH 5 DAYS Performed at [facility], [place] [address] [place],  [address]    Report Status 03/21/2019 FINAL  Final  SARS CORONAVIRUS 2 (TAT 6-24 HRS) Nasopharyngeal Nasopharyngeal Swab     Status: None   Collection Time: 03/17/19  6:30 PM   Specimen: Nasopharyngeal Swab  Result Value Ref Range Status   SARS Coronavirus 2 NEGATIVE NEGATIVE Final    Comment: (NOTE) SARS-CoV-2 target nucleic acids are NOT DETECTED. The SARS-CoV-2 RNA is generally detectable in upper and lower respiratory specimens during the acute phase of infection. Negative results do not preclude SARS-CoV-2 infection, do not rule out co-infections with other pathogens, and should not be used as the sole basis for treatment or other patient management decisions. Negative results must be combined with clinical observations, patient history, and epidemiological information. The  expected result is Negative. Fact Sheet for Patients: SugarRoll.be Fact Sheet for Healthcare Providers: https://www.woods-mathews.com/ This test is not yet approved or cleared by the [place] FDA and  has been authorized for detection and/or diagnosis of SARS-CoV-2 by FDA under an Emergency Use Authorization (EUA). This EUA will remain  in effect (meaning this test can be used) for the duration of the COVID-19 declaration under Section 56 4(b)(1) of the Act, 21 U.S.C. section 360bbb-3(b)(1), unless  the authorization is terminated or revoked sooner. Performed at [facility], [place] [address], [place], [place] [address]   Body fluid culture     Status: None (Preliminary result)   Collection Time: 03/20/19 11:35 AM   Specimen: PATH Cytology Pleural fluid  Result Value Ref Range Status   Specimen Description   Final    PLEURAL Performed at [facility] [address], [place], [place] [address]    Special Requests   Final    NONE Performed at [facility], [place] [address] [place], [place] [address]    Gram Stain NO WBC SEEN NO ORGANISMS SEEN   Final   Culture   Final    NO GROWTH 3 DAYS Performed at [facility], [place] [address] [place], [place] [address]    Report Status PENDING  Incomplete  Acid Fast Smear (AFB)     Status: None   Collection Time: 03/20/19 11:35 AM   Specimen: PATH Cytology Pleural fluid  Result Value Ref Range Status   AFB Specimen Processing Concentration  Final   Acid Fast Smear Negative  Final    Comment: (NOTE) Performed At: [facility] [place], [place] [id number] Rush Farmer MD [id number]    Source (AFB) PLEURAL  Final    Comment: Performed at [facility], [place] [address] [place], [place] [address]     Labs: Basic Metabolic Panel: Recent Labs  Lab 03/17/19 0129 03/17/19 0129 03/18/19 0127  03/18/19 0127 03/08/2019 0147 03/14/2019 0147 03/20/19 0211 03/20/19 0211 03/21/19 0229 03/22/19 0241  NA 139   < > 140  --  142  --  142  --  144 146*  K 3.9   < > 3.9   < > 3.8   < > 3.7   < > 3.5 3.8  CL 109   < > 109  --  111  --  111  --  111 112*  CO2 19*   < > 20*  --  20*  --  20*  --  22 22  GLUCOSE 113*   < > 134*  --  141*  --  129*  --  130* 140*  BUN 26*   < > 28*  --  26*  --  30*  --  27* 33*  CREATININE 1.33*   < > 1.32*  --  1.26*  --  1.55*  --  1.50* 1.78*  CALCIUM 8.0*   < > 8.3*  --  8.1*  --  8.1*  --  8.1* 8.1*  MG 2.0  --   --   --   --   --   --   --   --   --    < > = values in this interval not displayed.   Liver Function Tests: No results for input(s): AST, ALT, ALKPHOS, BILITOT, PROT, ALBUMIN in the last 168 hours. No results for input(s): LIPASE, AMYLASE in the last 168 hours. No results for input(s): AMMONIA in the last 168 hours. CBC: Recent Labs  Lab 03/17/19 0129 03/04/2019 0147 03/20/19 0211 03/21/19 0229 03/22/19 0241  WBC 10.5 11.2* 12.0* 14.1* 18.7*  NEUTROABS  --  8.8* 9.6* 11.8* 16.1*  HGB 11.3* 10.8* 10.5* 11.4* 11.6*  HCT 35.6* 34.0* 32.8* 35.4* 35.6*  MCV 96.2 96.6 95.9 95.4 95.4  PLT 199 202 225 245 243   Cardiac Enzymes: No results for input(s): CKTOTAL, CKMB, CKMBINDEX, TROPONINI in  the last 168 hours. D-Dimer No results for input(s): DDIMER in the last 72 hours. BNP: Invalid input(s): POCBNP CBG: No results for input(s): GLUCAP in the last 168 hours. Anemia work up No results for input(s): VITAMINB12, FOLATE, FERRITIN, TIBC, IRON, RETICCTPCT in the last 72 hours. Urinalysis    Component Value Date/Time   COLORURINE YELLOW 03/16/2019 0222   APPEARANCEUR HAZY (A) 03/16/2019 0222   LABSPEC 1.024 03/16/2019 0222   PHURINE 5.0 03/16/2019 0222   GLUCOSEU NEGATIVE 03/16/2019 0222   HGBUR NEGATIVE 03/16/2019 0222   BILIRUBINUR NEGATIVE 03/16/2019 0222   KETONESUR 5 (A) 03/16/2019 0222   PROTEINUR 30 (A) 03/16/2019 0222    NITRITE NEGATIVE 03/16/2019 0222   LEUKOCYTESUR NEGATIVE 03/16/2019 0222   Sepsis Labs Invalid input(s): PROCALCITONIN,  WBC,  LACTICIDVEN     SIGNED:  Alma Friendly, MD  [facility] Apr 15, 2019, 6:17 PM   If 7PM-7AM, please contact night-coverage [email or web address]

## 2019-03-29 NOTE — Progress Notes (Signed)
Pt unresponsive and apneic.  Unable to palpate carotid pulse.  DNR status confirmed.  Asystole on monitor and confirmed in two leads.  No breath sounds auscultated for full minute.  No heart sounds auscultated for full minute.  Pupils fixed and dilated.  Confirmed by second registered nurse. Patient pronounced dead at:  0236 per [place] [facility], RN. [facility] Mari Battaglia RN

## 2019-03-29 DEATH — deceased

## 2019-04-07 IMAGING — CT CT L SPINE W/O CM
1 of 7 series · 5 of 14 positions shown, 7 images · non-contrast
Comparison: Lumbar radiographs 09/25/2016. Chest radiographs
04/21/2016.

CLINICAL DATA: [AGE] male with lumbar back pain, left leg
numbness, spasms. Scoliosis, prior fusion.

EXAM:
CT LUMBAR SPINE WITHOUT CONTRAST
TECHNIQUE: Multidetector CT imaging of the lumbar spine was performed without
intravenous contrast administration. Multiplanar CT image
reconstructions were also generated.

[Series 3: l spine soft · axial · 0.34mm/px · z∈[+484,+640]mm · 5 of 79 slices shown, 7 images]
[im 14/79  soft-tissue]
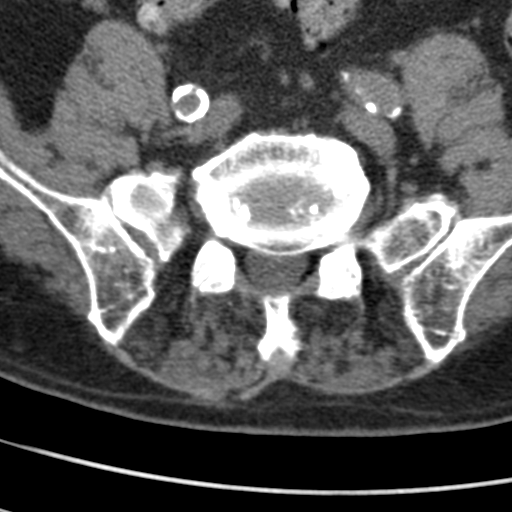
[im 14/79  bone]
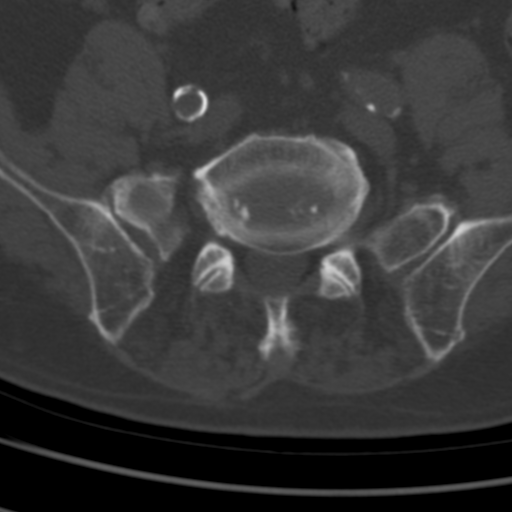
[im 27/79  bone]
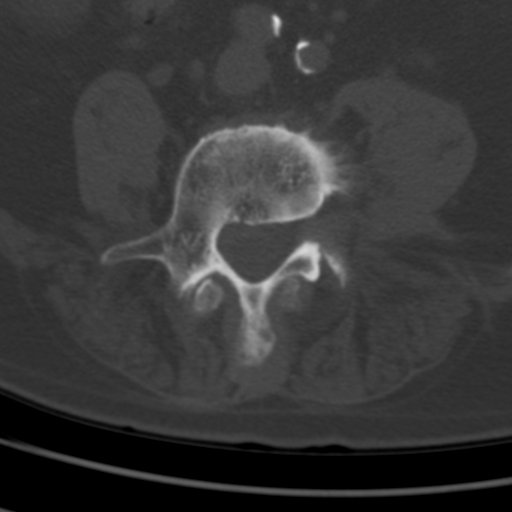
[im 40/79  bone]
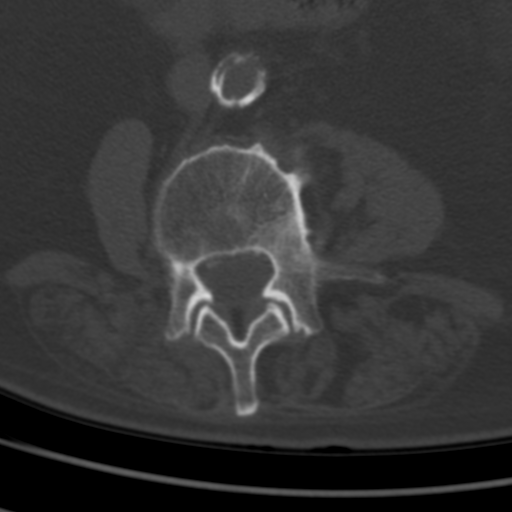
[im 53/79  bone]
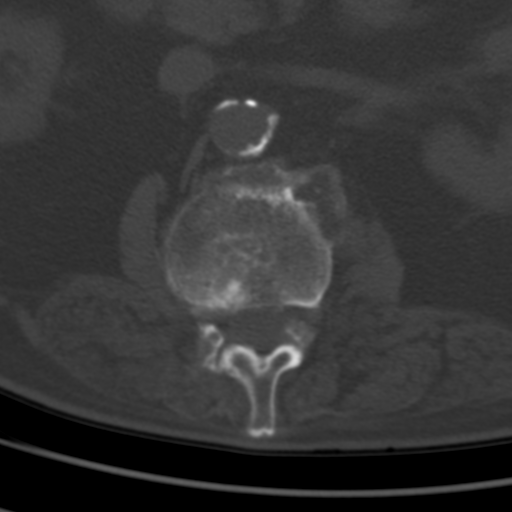
[im 66/79  soft-tissue]
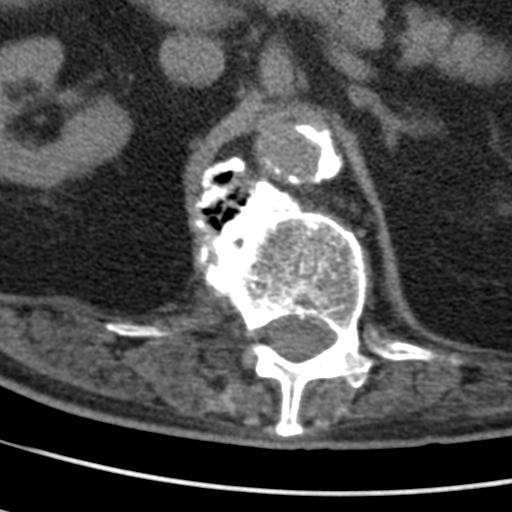
[im 66/79  bone]
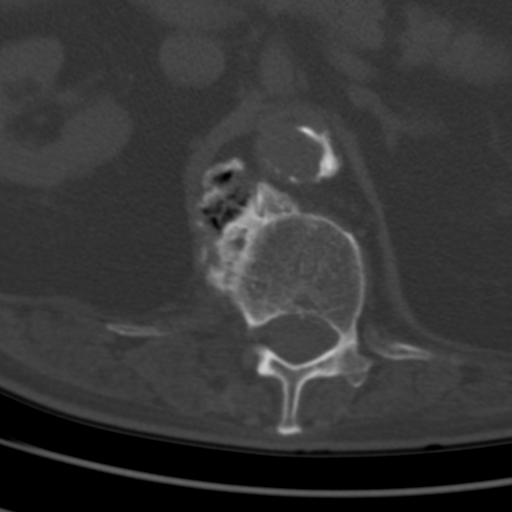

[5 of 14 positions shown; findings below may reference images not displayed]

FINDINGS: Segmentation: Normal.

Alignment: Stable from the recent lumbar radiographs. Moderate
dextroconvex lumbar scoliosis. Straightening of lumbar lordosis,
mild reversal of lordosis in the upper lumbar spine.

Vertebrae: Solid interbody ankylosis or arthrodesis at L1-L2.
Diffuse bulky lower thoracic and lumbar endplate osteophytosis.
Advanced lower lumbar facet degeneration. No acute osseous
abnormality identified. Visible sacrum and SI joints intact.

Paraspinal and other soft tissues: Aortoiliac calcified
atherosclerosis. Negative visualized abdominal viscera.
Diverticulosis in the distal colon. Partially visible urinary
bladder distension. Negative paraspinal soft tissues.

Disc levels: T11-T12: Relatively preserved disc height but vacuum
disc. Anterior eccentric disc osteophyte complex. Suggestion of
biforaminal, but no spinal stenosis.

T12-L1: Diffuse vacuum disc. Right eccentric circumferential disc
osteophyte complex. Mild to moderate right T12 foraminal stenosis
but no significant spinal stenosis.

L1-L2: Solid interbody fusion with left eccentric disc osteophyte
complex. Mild to moderate left lateral recess stenosis (descending
left L2 nerve root level), with no significant spinal stenosis. Mild
to moderate osseous left L1 foraminal stenosis.

L2-L3: Complete disc space loss with vacuum disc. Bulky
circumferential disc osteophyte complex, broad-based posterior
component. No significant spinal stenosis. Mild to moderate lateral
recess stenosis greater on the left. Mild left greater than right L2
foraminal stenosis.

L3-L4: Complete disc space loss. Vacuum disc. Bulky circumferential
disc osteophyte complex with broad-based posterior component. No
significant spinal stenosis. Possible mild to moderate bilateral
lateral recess stenosis. Mild to moderate left and mild right L3
foraminal stenosis.

L4-L5: Complete disc space loss. Vacuum disc. Bulky circumferential
disc osteophyte complex. Broad-based posterior component. Previous
right laminectomy. Moderate to severe facet hypertrophy with vacuum
phenomena. Left ligament flavum hypertrophy or less likely synovial
cyst (series 3, image 57). Moderate to severe lateral recess
stenosis suspected greater on the left. Mild to moderate spinal
stenosis. Moderate to severe left and mild to moderate right L4
foraminal stenosis.

L5-S1: Relatively preserved disc space. Mild circumferential disc
bulge and endplate spurring. Mild to moderate facet hypertrophy. No
significant stenosis.
IMPRESSION: 1. No acute osseous abnormality identified. In the lumbar spine.
Dextroconvex scoliosis and solid interbody fusion at L1-L2.
2. Previous right laminectomy at L4-L5. Advanced disc, endplate, and
posterior element degeneration at that level with mild to moderate
spinal, moderate to severe lateral recess and left neural foraminal
stenosis. Query left L4 and/or L5 radiculitis.
3. Advanced lumbar disc and endplate degeneration elsewhere. No
other significant spinal stenosis. Possible mild to moderate lateral
recess stenosis on the left at L2-L3 and bilaterally at L3-L4. Mild
to moderate left neural foraminal stenosis at the L1 through L3
nerve levels.
4.  Aortic Atherosclerosis ([id number]).

## 2019-04-23 LAB — FUNGUS CULTURE WITH STAIN

## 2019-04-23 LAB — FUNGAL ORGANISM REFLEX

## 2019-04-23 LAB — FUNGUS CULTURE RESULT

## 2019-05-03 LAB — ACID FAST CULTURE WITH REFLEXED SENSITIVITIES (MYCOBACTERIA): Acid Fast Culture: NEGATIVE

## 2019-05-14 ENCOUNTER — Encounter: Payer: Self-pay | Admitting: Internal Medicine
